# Patient Record
Sex: Female | Born: 1960 | Race: White | Hispanic: No | Marital: Married | Smoking: Never smoker
Health system: Southern US, Community
[De-identification: ages and names within clinical notes are randomized; demographics above are authoritative.]

## PROBLEM LIST (undated history)

## (undated) ENCOUNTER — Ambulatory Visit

## (undated) DIAGNOSIS — E785 Hyperlipidemia, unspecified: Secondary | ICD-10-CM

## (undated) DIAGNOSIS — H269 Unspecified cataract: Secondary | ICD-10-CM

## (undated) DIAGNOSIS — K219 Gastro-esophageal reflux disease without esophagitis: Secondary | ICD-10-CM

## (undated) DIAGNOSIS — N200 Calculus of kidney: Secondary | ICD-10-CM

## (undated) DIAGNOSIS — F419 Anxiety disorder, unspecified: Secondary | ICD-10-CM

## (undated) DIAGNOSIS — R011 Cardiac murmur, unspecified: Secondary | ICD-10-CM

## (undated) DIAGNOSIS — F32A Depression, unspecified: Secondary | ICD-10-CM

## (undated) DIAGNOSIS — Z780 Asymptomatic menopausal state: Secondary | ICD-10-CM

## (undated) DIAGNOSIS — F329 Major depressive disorder, single episode, unspecified: Secondary | ICD-10-CM

## (undated) DIAGNOSIS — T7840XA Allergy, unspecified, initial encounter: Secondary | ICD-10-CM

## (undated) DIAGNOSIS — M1990 Unspecified osteoarthritis, unspecified site: Secondary | ICD-10-CM

## (undated) DIAGNOSIS — R87610 Atypical squamous cells of undetermined significance on cytologic smear of cervix (ASC-US): Secondary | ICD-10-CM

## (undated) HISTORY — DX: Calculus of kidney: N20.0

## (undated) HISTORY — DX: Anxiety disorder, unspecified: F41.9

## (undated) HISTORY — DX: Cardiac murmur, unspecified: R01.1

## (undated) HISTORY — PX: TONSILLECTOMY: SUR1361

## (undated) HISTORY — DX: Unspecified osteoarthritis, unspecified site: M19.90

## (undated) HISTORY — DX: Atypical squamous cells of undetermined significance on cytologic smear of cervix (ASC-US): R87.610

## (undated) HISTORY — DX: Unspecified cataract: H26.9

## (undated) HISTORY — DX: Major depressive disorder, single episode, unspecified: F32.9

## (undated) HISTORY — DX: Hyperlipidemia, unspecified: E78.5

## (undated) HISTORY — PX: COLPOSCOPY: SHX161

## (undated) HISTORY — DX: Asymptomatic menopausal state: Z78.0

## (undated) HISTORY — DX: Depression, unspecified: F32.A

## (undated) HISTORY — DX: Gastro-esophageal reflux disease without esophagitis: K21.9

## (undated) HISTORY — DX: Allergy, unspecified, initial encounter: T78.40XA

## (undated) HISTORY — PX: DILATION AND CURETTAGE OF UTERUS: SHX78

---

## 1983-12-28 HISTORY — PX: TONSILLECTOMY: SHX5217

## 1998-05-05 ENCOUNTER — Other Ambulatory Visit: Admission: RE | Admit: 1998-05-05 | Discharge: 1998-05-05 | Payer: Self-pay | Admitting: Obstetrics and Gynecology

## 2000-12-06 ENCOUNTER — Encounter: Admission: RE | Admit: 2000-12-06 | Discharge: 2000-12-06 | Payer: Self-pay | Admitting: Family Medicine

## 2000-12-06 ENCOUNTER — Encounter: Payer: Self-pay | Admitting: Family Medicine

## 2001-09-17 ENCOUNTER — Emergency Department (HOSPITAL_COMMUNITY): Admission: EM | Admit: 2001-09-17 | Discharge: 2001-09-17 | Payer: Self-pay | Admitting: Emergency Medicine

## 2001-12-12 ENCOUNTER — Encounter: Payer: Self-pay | Admitting: Emergency Medicine

## 2001-12-12 ENCOUNTER — Emergency Department (HOSPITAL_COMMUNITY): Admission: EM | Admit: 2001-12-12 | Discharge: 2001-12-12 | Payer: Self-pay | Admitting: Emergency Medicine

## 2002-03-22 ENCOUNTER — Other Ambulatory Visit: Admission: RE | Admit: 2002-03-22 | Discharge: 2002-03-22 | Payer: Self-pay | Admitting: Gynecology

## 2002-09-25 ENCOUNTER — Other Ambulatory Visit: Admission: RE | Admit: 2002-09-25 | Discharge: 2002-09-25 | Payer: Self-pay | Admitting: Gynecology

## 2003-10-30 ENCOUNTER — Other Ambulatory Visit: Admission: RE | Admit: 2003-10-30 | Discharge: 2003-10-30 | Payer: Self-pay | Admitting: Gynecology

## 2004-05-05 ENCOUNTER — Other Ambulatory Visit: Admission: RE | Admit: 2004-05-05 | Discharge: 2004-05-05 | Payer: Self-pay | Admitting: Gynecology

## 2004-11-30 ENCOUNTER — Other Ambulatory Visit: Admission: RE | Admit: 2004-11-30 | Discharge: 2004-11-30 | Payer: Self-pay | Admitting: Gynecology

## 2005-07-08 ENCOUNTER — Other Ambulatory Visit: Admission: RE | Admit: 2005-07-08 | Discharge: 2005-07-08 | Payer: Self-pay | Admitting: Gynecology

## 2005-12-09 ENCOUNTER — Other Ambulatory Visit: Admission: RE | Admit: 2005-12-09 | Discharge: 2005-12-09 | Payer: Self-pay | Admitting: Gynecology

## 2006-07-26 ENCOUNTER — Other Ambulatory Visit: Admission: RE | Admit: 2006-07-26 | Discharge: 2006-07-26 | Payer: Self-pay | Admitting: Gynecology

## 2007-01-11 ENCOUNTER — Other Ambulatory Visit: Admission: RE | Admit: 2007-01-11 | Discharge: 2007-01-11 | Payer: Self-pay | Admitting: Gynecology

## 2007-07-24 ENCOUNTER — Other Ambulatory Visit: Admission: RE | Admit: 2007-07-24 | Discharge: 2007-07-24 | Payer: Self-pay | Admitting: Gynecology

## 2008-01-29 ENCOUNTER — Other Ambulatory Visit: Admission: RE | Admit: 2008-01-29 | Discharge: 2008-01-29 | Payer: Self-pay | Admitting: Gynecology

## 2008-09-30 ENCOUNTER — Other Ambulatory Visit: Admission: RE | Admit: 2008-09-30 | Discharge: 2008-09-30 | Payer: Self-pay | Admitting: Gynecology

## 2008-09-30 ENCOUNTER — Ambulatory Visit: Payer: Self-pay | Admitting: Gynecology

## 2008-09-30 ENCOUNTER — Encounter: Payer: Self-pay | Admitting: Gynecology

## 2008-10-14 ENCOUNTER — Ambulatory Visit: Payer: Self-pay | Admitting: Internal Medicine

## 2009-03-06 ENCOUNTER — Ambulatory Visit: Payer: Self-pay | Admitting: Internal Medicine

## 2009-03-10 ENCOUNTER — Ambulatory Visit: Payer: Self-pay | Admitting: Gynecology

## 2009-03-10 ENCOUNTER — Other Ambulatory Visit: Admission: RE | Admit: 2009-03-10 | Discharge: 2009-03-10 | Payer: Self-pay | Admitting: Gynecology

## 2009-03-10 ENCOUNTER — Encounter: Payer: Self-pay | Admitting: Gynecology

## 2009-03-19 ENCOUNTER — Ambulatory Visit: Payer: Self-pay | Admitting: Gynecology

## 2009-12-27 HISTORY — PX: COLONOSCOPY: SHX174

## 2010-03-17 ENCOUNTER — Ambulatory Visit: Payer: Self-pay | Admitting: Gynecology

## 2010-03-23 ENCOUNTER — Ambulatory Visit: Payer: Self-pay | Admitting: Gynecology

## 2010-03-23 ENCOUNTER — Other Ambulatory Visit: Admission: RE | Admit: 2010-03-23 | Discharge: 2010-03-23 | Payer: Self-pay | Admitting: Gynecology

## 2010-06-15 ENCOUNTER — Encounter (INDEPENDENT_AMBULATORY_CARE_PROVIDER_SITE_OTHER): Payer: Self-pay | Admitting: *Deleted

## 2010-06-15 ENCOUNTER — Ambulatory Visit: Payer: Self-pay | Admitting: Internal Medicine

## 2010-08-07 ENCOUNTER — Ambulatory Visit: Payer: Self-pay | Admitting: Internal Medicine

## 2010-08-19 ENCOUNTER — Encounter (INDEPENDENT_AMBULATORY_CARE_PROVIDER_SITE_OTHER): Payer: Self-pay | Admitting: *Deleted

## 2010-08-24 ENCOUNTER — Ambulatory Visit: Payer: Self-pay | Admitting: Internal Medicine

## 2010-09-17 ENCOUNTER — Ambulatory Visit: Payer: Self-pay | Admitting: Internal Medicine

## 2010-09-19 ENCOUNTER — Encounter: Payer: Self-pay | Admitting: Internal Medicine

## 2011-01-26 NOTE — Procedures (Signed)
Summary: Colonoscopy  Patient: [name] Note: All result statuses are Final unless otherwise noted.  Tests: (1) Colonoscopy (COL)   COL Colonoscopy           DONE     [facility]     [address]     [place], [place]  [address]           COLONOSCOPY PROCEDURE REPORT           PATIENT:  Paula Bradley, Paula Bradley  MR#:  [id number]     BIRTHDATE:  May 03, 1961, 48 yrs. old  GENDER:  female     ENDOSCOPIST:  Hedwig Morton. Juanda Chance, MD     REF. BY:  Sharlet Salina, M.D.     PROCEDURE DATE:  09/17/2010     PROCEDURE:  Colonoscopy 56213     ASA CLASS:  Class I     INDICATIONS:  Routine Risk Screening     MEDICATIONS:   Versed 8 mg, Fentanyl 50 mcg, Benadryl 25 mg           DESCRIPTION OF PROCEDURE:   After the risks benefits and     alternatives of the procedure were thoroughly explained, informed     consent was obtained.  Digital rectal exam was performed and     revealed no rectal masses.   The LB CF-[id number] endoscope     was introduced through the anus and advanced to the cecum, which     was identified by both the appendix and ileocecal valve, without     limitations.  The quality of the prep was good, using MiraLax.     The instrument was then slowly withdrawn as the colon was fully     examined.     <<PROCEDUREIMAGES>>           FINDINGS:  A diminutive polyp was found. at 40 cm sessile     elongated polyp The polyp was removed using cold biopsy forceps     (see image4).  This was otherwise a normal examination of the     colon (see image5, image3, image2, and image1).   Retroflexed     views in the rectum revealed no abnormalities.    The scope was     then withdrawn from the patient and the procedure completed.           COMPLICATIONS:  None     ENDOSCOPIC IMPRESSION:     1) Diminutive polyp     2) Otherwise normal examination     3) Normal colonoscopy     RECOMMENDATIONS:     1) Await pathology results     REPEAT EXAM:  In 10 year(s) for.        ______________________________     Hedwig Morton. Juanda Chance, MD           CC:           n.     eSIGNED:   Hedwig Morton. Brodie at 09/17/2010 10:31 AM           Loura Back, [id number]  Note: An exclamation mark (!) indicates a result that was not dispersed into the flowsheet. Document Creation Date: 09/17/2010 10:31 AM _______________________________________________________________________  (1) Order result status: Final Collection or observation date-time: 09/17/2010 10:25 Requested date-time:  Receipt date-time:  Reported date-time:  Referring Physician:   Ordering Physician: Lina Sar [phone]) Specimen Source:  Source: Launa Grill Order Number: [id number] Lab site:   Appended Document: Colonoscopy recall  Procedures Next Due Date:    Colonoscopy: 08/2020

## 2011-01-26 NOTE — Letter (Signed)
Summary: [facility] Instructions  [facility]  [address] [place], [place] [address]   Phone: [phone]  Fax: [phone]       FRUMA AFRICA    10-Aug-1961    MRN: [id number]       Procedure Day Paula Bradley:  Duanne Limerick  09/07/10     Arrival Time: 8:30AM     Procedure Time:  9:30AM     Location of Procedure:                    Juliann Pares  [facility] (4th Floor)  PREPARATION FOR COLONOSCOPY WITH MIRALAX  Starting 5 days prior to your procedure 09/02/10 do not eat nuts, seeds, popcorn, corn, beans, peas,  salads, or any raw vegetables.  Do not take any fiber supplements (e.g. Metamucil, Citrucel, and Benefiber). ____________________________________________________________________________________________________   THE DAY BEFORE YOUR PROCEDURE         DATE: 09/06/10  DAY: SUNDAY  1   Drink clear liquids the entire day-NO SOLID FOOD  2   Do not drink anything colored red or purple.  Avoid juices with pulp.  No orange juice.  3   Drink at least 64 oz. (8 glasses) of fluid/clear liquids during the day to prevent dehydration and help the prep work efficiently.  CLEAR LIQUIDS INCLUDE: Water Jello Ice Popsicles Tea (sugar ok, no milk/cream) Powdered fruit flavored drinks Coffee (sugar ok, no milk/cream) Gatorade Juice: apple, white grape, white cranberry  Lemonade Clear bullion, consomm, broth Carbonated beverages (any kind) Strained chicken noodle soup Hard Candy  4   Mix the entire bottle of Miralax with 64 oz. of Gatorade/Powerade in the morning and put in the refrigerator to chill.  5   At 3:00 pm take 2 Dulcolax/Bisacodyl tablets.  6   At 4:30 pm take one Reglan/Metoclopramide tablet.  7  Starting at 5:00 pm drink one 8 oz glass of the Miralax mixture every 15-20 minutes until you have finished drinking the entire 64 oz.  You should finish drinking prep around 7:30 or 8:00 pm.  8   If you are nauseated, you may take the 2nd Reglan/Metoclopramide tablet at  6:30 pm.        9    At 8:00 pm take 2 more DULCOLAX/Bisacodyl tablets.     THE DAY OF YOUR PROCEDURE      DATE:  09/07/10  DAY: Duanne Limerick  You may drink clear liquids until 7:30AM  (2 HOURS BEFORE PROCEDURE).   MEDICATION INSTRUCTIONS  Unless otherwise instructed, you should take regular prescription medications with a small sip of water as early as possible the morning of your procedure.          OTHER INSTRUCTIONS  You will need a responsible adult at least 52 years of age to accompany you and drive you home.   This person must remain in the waiting room during your procedure.  Wear loose fitting clothing that is easily removed.  Leave jewelry and other valuables at home.  However, you may wish to bring a book to read or an iPod/MP3 player to listen to music as you wait for your procedure to start.  Remove all body piercing jewelry and leave at home.  Total time from sign-in until discharge is approximately 2-3 hours.  You should go home directly after your procedure and rest.  You can resume normal activities the day after your procedure.  The day of your procedure you should not:   Drive  Make legal decisions   Operate machinery   Drink alcohol   Return to work  You will receive specific instructions about eating, activities and medications before you leave.   The above instructions have been reviewed and explained to me by  Wyona Almas RN  August 24, 2010 8:47 AM     I fully understand and can verbalize these instructions _____________________________ Date _______

## 2011-01-26 NOTE — Miscellaneous (Signed)
Summary: [facility] Previsit/prep  Clinical Lists Changes  Medications: Added new medication of DULCOLAX 5 MG  TBEC (BISACODYL) Day before procedure take 2 at 3pm and 2 at 8pm. - Signed Added new medication of METOCLOPRAMIDE HCL 10 MG  TABS (METOCLOPRAMIDE HCL) As per prep instructions. - Signed Added new medication of MIRALAX   POWD (POLYETHYLENE GLYCOL 3350) As per prep  instructions. - Signed Rx of DULCOLAX 5 MG  TBEC (BISACODYL) Day before procedure take 2 at 3pm and 2 at 8pm.;  #4 x 0;  Signed;  Entered by: Wyona Almas RN;  Authorized by: Hart Carwin MD;  Method used: Electronically to [facility] [id number]*, [address], [place], [place], [place]  [address], Ph: [phone], Fax: [phone] Rx of METOCLOPRAMIDE HCL 10 MG  TABS (METOCLOPRAMIDE HCL) As per prep instructions.;  #2 x 0;  Signed;  Entered by: Wyona Almas RN;  Authorized by: Hart Carwin MD;  Method used: Electronically to [facility] [id number]*, [address] [place], [place], [place]  [address], Ph: [phone], Fax: [phone] Rx of MIRALAX   POWD (POLYETHYLENE GLYCOL 3350) As per prep  instructions.;  #255gm x 0;  Signed;  Entered by: Wyona Almas RN;  Authorized by: Hart Carwin MD;  Method used: Electronically to [facility] [address][id number]*, [address] [place], [place], [place]  [address], Ph: [phone], Fax: [phone] Allergies: Added new allergy or adverse reaction of * ADVAIR Observations: Added new observation of NKA: F (08/24/2010 8:04)    Prescriptions: MIRALAX   POWD (POLYETHYLENE GLYCOL 3350) As per prep  instructions.  #255gm x 0   Entered by:   Wyona Almas RN   Authorized by:   Hart Carwin MD   Signed by:   Wyona Almas RN on 08/24/2010   Method used:   Electronically to        [facility] [id number]* (retail)       [address] [place]       [place], [place]  [address]     Ph: [phone]       Fax: [phone]   RxID:   [id number] METOCLOPRAMIDE HCL 10 MG  TABS (METOCLOPRAMIDE HCL) As per prep instructions.  #2 x 0   Entered by:   Wyona Almas RN   Authorized by:   Hart Carwin MD   Signed by:   Wyona Almas RN on 08/24/2010   Method used:   Electronically to        [facility] [id number]* (retail)       [address] [place]       [place], [place]  [address]       Ph: [phone]       Fax: [phone]   RxID:   [id number] DULCOLAX 5 MG  TBEC (BISACODYL) Day before procedure take 2 at 3pm and 2 at 8pm.  #4 x 0   Entered by:   Wyona Almas RN   Authorized by:   Hart Carwin MD   Signed by:   Wyona Almas RN on 08/24/2010   Method used:   Electronically to        [facility] [address] [facility] [id number]* (retail)       [address]       [place], [place]  [address]       Ph: [phone]       Fax: [phone]   RxID:   [id number]

## 2011-01-26 NOTE — Letter (Signed)
Summary: Patient Notice- Polyp Results  [facility]  [address] [place], [place] [address]   Phone: [phone]  Fax: [phone]        September 19, 2010 MRN: [id number]    Paula Bradley [address] [place], [place]  [address]    Dear [name],  I am pleased to inform you that the colon polyp(s) removed during your recent colonoscopy was (were) found to be benign (no cancer detected) upon pathologic examination.The polyp consisted of lymphoid tissue, no true polyp.  I recommend you have a repeat colonoscopy examination in 10_ years to look for recurrent polyps, as having colon polyps increases your risk for having recurrent polyps or even colon cancer in the future.  Should you develop new or worsening symptoms of abdominal pain, bowel habit changes or bleeding from the rectum or bowels, please schedule an evaluation with either your primary care physician or with me.  Additional information/recommendations:  __ No further action with gastroenterology is needed at this time. Please      follow-up with your primary care physician for your other healthcare      needs.  __ Please call [phone] to schedule a return visit to review your      situation.  __ Please keep your follow-up visit as already scheduled.  __ Continue treatment plan as outlined the day of your exam.  Please call us if you are having persistent problems or have questions about your condition that have not been fully answered at this time.  Sincerely,  Hart Carwin MD  This letter has been electronically signed by your physician.  Appended Document: Patient Notice- Polyp Results letter mailed

## 2011-01-26 NOTE — Letter (Signed)
Summary: Previsit letter  [facility]  [address] [place], [place] [address]   Phone: [phone]  Fax: [phone]       06/15/2010 MRN: [id number]  Paula Bradley [address] [place], [place]  [address]  Dear [name],  Welcome to the Gastroenterology Division at [facility].    You are scheduled to see a nurse for your pre-procedure visit on 08-24-10 at 8:30am On the 3rd floor at [facility], [address]  We ask that you try to arrive at our office 15 minutes prior to your appointment time to allow for check-in.  Your nurse visit will consist of discussing your medical and surgical history, your immediate family medical history, and your medications.    Please bring a complete list of all your medications or, if you prefer, bring the medication bottles and we will list them.  We will need to be aware of both prescribed and over the counter drugs.  We will need to know exact dosage information as well.  If you are on blood thinners (Coumadin, Plavix, Aggrenox, Ticlid, etc.) please call our office today/prior to your appointment, as we need to consult with your physician about holding your medication.   Please be prepared to read and sign documents such as consent forms, a financial agreement, and acknowledgement forms.  If necessary, and with your consent, a friend or relative is welcome to sit-in on the nurse visit with you.  Please bring your insurance card so that we may make a copy of it.  If your insurance requires a referral to see a specialist, please bring your referral form from your primary care physician.  No co-pay is required for this nurse visit.     If you cannot keep your appointment, please call [phone] to cancel or reschedule prior to your appointment date.  This allows [place] the opportunity to schedule an appointment for another patient in need of care.    Thank you for choosing [facility] for your medical  needs.  We appreciate the opportunity to care for you.  Please visit [place] at our website  to learn more about our practice.                     Sincerely.                                                                                                                   The Gastroenterology Division

## 2011-02-18 DIAGNOSIS — H6120 Impacted cerumen, unspecified ear: Secondary | ICD-10-CM

## 2011-03-04 ENCOUNTER — Ambulatory Visit (INDEPENDENT_AMBULATORY_CARE_PROVIDER_SITE_OTHER): Payer: BC Managed Care – PPO | Admitting: Internal Medicine

## 2011-03-04 DIAGNOSIS — E785 Hyperlipidemia, unspecified: Secondary | ICD-10-CM

## 2011-03-04 DIAGNOSIS — H6120 Impacted cerumen, unspecified ear: Secondary | ICD-10-CM

## 2011-03-15 ENCOUNTER — Encounter: Payer: Self-pay | Admitting: *Deleted

## 2011-03-25 ENCOUNTER — Encounter: Payer: BC Managed Care – PPO | Admitting: Gynecology

## 2011-04-30 ENCOUNTER — Encounter (INDEPENDENT_AMBULATORY_CARE_PROVIDER_SITE_OTHER): Payer: BC Managed Care – PPO | Admitting: Gynecology

## 2011-04-30 ENCOUNTER — Other Ambulatory Visit (HOSPITAL_COMMUNITY)
Admission: RE | Admit: 2011-04-30 | Discharge: 2011-04-30 | Disposition: A | Discharge status: Home / Self Care | Payer: BC Managed Care – PPO | Source: Ambulatory Visit | Attending: Gynecology | Admitting: Gynecology

## 2011-04-30 ENCOUNTER — Other Ambulatory Visit: Payer: Self-pay | Admitting: Gynecology

## 2011-04-30 DIAGNOSIS — Z13220 Encounter for screening for lipoid disorders: Secondary | ICD-10-CM

## 2011-04-30 DIAGNOSIS — Z124 Encounter for screening for malignant neoplasm of cervix: Secondary | ICD-10-CM | POA: Insufficient documentation

## 2011-04-30 DIAGNOSIS — N952 Postmenopausal atrophic vaginitis: Secondary | ICD-10-CM

## 2011-04-30 DIAGNOSIS — R82998 Other abnormal findings in urine: Secondary | ICD-10-CM

## 2011-04-30 DIAGNOSIS — Z833 Family history of diabetes mellitus: Secondary | ICD-10-CM

## 2011-04-30 DIAGNOSIS — Z79890 Hormone replacement therapy (postmenopausal): Secondary | ICD-10-CM

## 2011-04-30 DIAGNOSIS — Z01419 Encounter for gynecological examination (general) (routine) without abnormal findings: Secondary | ICD-10-CM

## 2011-05-06 ENCOUNTER — Encounter (INDEPENDENT_AMBULATORY_CARE_PROVIDER_SITE_OTHER): Payer: BC Managed Care – PPO

## 2011-05-06 DIAGNOSIS — Z13820 Encounter for screening for osteoporosis: Secondary | ICD-10-CM

## 2011-09-07 ENCOUNTER — Other Ambulatory Visit: Payer: BC Managed Care – PPO | Admitting: Internal Medicine

## 2011-09-09 ENCOUNTER — Ambulatory Visit: Payer: BC Managed Care – PPO | Admitting: Internal Medicine

## 2011-11-29 ENCOUNTER — Encounter: Payer: Self-pay | Admitting: Internal Medicine

## 2011-11-29 ENCOUNTER — Ambulatory Visit (INDEPENDENT_AMBULATORY_CARE_PROVIDER_SITE_OTHER): Payer: BC Managed Care – PPO | Admitting: Internal Medicine

## 2011-11-29 DIAGNOSIS — J111 Influenza due to unidentified influenza virus with other respiratory manifestations: Secondary | ICD-10-CM

## 2011-11-29 DIAGNOSIS — J45909 Unspecified asthma, uncomplicated: Secondary | ICD-10-CM

## 2011-11-29 DIAGNOSIS — F411 Generalized anxiety disorder: Secondary | ICD-10-CM

## 2011-11-29 DIAGNOSIS — F419 Anxiety disorder, unspecified: Secondary | ICD-10-CM

## 2011-11-29 NOTE — Progress Notes (Signed)
  Subjective:    Patient ID: Paula Bradley, female    DOB: 1961-08-06, 50 y.o.   MRN: [id number]  HPI Patient's mother passed away in early Nov 13, 2023 of complications of dementia and septicemia from urinary tract infection. Patient has been grieving the loss of her mother. In the meantime she and her husband have been going to marriage counseling. Patient says that she has invested a great deal of time in raising her children. Husband tends to go to the country club at night to socialize and drinking comes on at 9 or 10:00 in the evening. They have grown apart. Patient has considered leaving the marriage.  Patient has developed fever and has felt short of breath. History of asthma. Albuterol inhaler as expired. Patient has myalgias, dry hacking cough, malaise, fatigue, decreased appetite. Has been ill since 11/27/2011. No nausea or vomiting. No diarrhea.    Review of Systems     Objective:   Physical Exam HEENT exam: TMs are clear, pharynx is clear. Neck is supple without significant adenopathy. Chest is clear without rales or wheezing.        Assessment & Plan:  Influenza  Grief reaction from loss of mother  Marital strife  Plan: Tamiflu 75 mg twice daily for 5 days. Zithromax Z-PAK 2 tablets by mouth day one followed by 1 tablet by mouth days 2 through 5. Hycodan syrup 1 teaspoon by mouth every 6 hours when necessary for cough (8 ounces) refill albuterol inhaler 2 sprays by mouth 4 times a day when necessary one year

## 2011-11-29 NOTE — Patient Instructions (Signed)
Use albuterol inhaler as needed for chest tightness and cough or wheezing. Take Tamiflu twice daily for 5 days. Start Zithromax Z-Pak today 2 tablets by mouth day one followed 01 tablet by mouth days 2 through 5. Use Hycodan for cough. Drink plenty of fluids. Try to rest. Call if not better in 48-72 hours or sooner if worse

## 2012-01-03 ENCOUNTER — Other Ambulatory Visit: Payer: Self-pay | Admitting: Internal Medicine

## 2012-01-03 ENCOUNTER — Telehealth: Payer: Self-pay

## 2012-01-03 NOTE — Telephone Encounter (Signed)
Seen here for URI approx  1 month ago. Is still have severe coughing spells and went to an Urgent Care over the weekend. Was given Prednisone dosepak and told to continue [employer]. Wonders  If she needs another antibiotic. Appointment made here for tomorrow.

## 2012-01-03 NOTE — Telephone Encounter (Signed)
See tomorrow

## 2012-01-04 ENCOUNTER — Ambulatory Visit (INDEPENDENT_AMBULATORY_CARE_PROVIDER_SITE_OTHER): Payer: PRIVATE HEALTH INSURANCE | Admitting: Internal Medicine

## 2012-01-04 ENCOUNTER — Encounter: Payer: Self-pay | Admitting: Internal Medicine

## 2012-01-04 VITALS — BP 116/76 | HR 76 | Temp 98.5°F | Wt 192.0 lb

## 2012-01-04 DIAGNOSIS — J40 Bronchitis, not specified as acute or chronic: Secondary | ICD-10-CM

## 2012-01-04 DIAGNOSIS — J45909 Unspecified asthma, uncomplicated: Secondary | ICD-10-CM

## 2012-01-04 DIAGNOSIS — F4321 Adjustment disorder with depressed mood: Secondary | ICD-10-CM

## 2012-01-04 DIAGNOSIS — E669 Obesity, unspecified: Secondary | ICD-10-CM

## 2012-01-04 DIAGNOSIS — F411 Generalized anxiety disorder: Secondary | ICD-10-CM

## 2012-01-04 DIAGNOSIS — R059 Cough, unspecified: Secondary | ICD-10-CM

## 2012-01-04 DIAGNOSIS — R05 Cough: Secondary | ICD-10-CM

## 2012-01-04 DIAGNOSIS — F419 Anxiety disorder, unspecified: Secondary | ICD-10-CM
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
## 2012-01-04 NOTE — Patient Instructions (Signed)
Take prednisone as prescribed by Dr. Ivory Broad. Start Avelox 400 mg daily for 10 days. Try Flovent DS 250 MCG one spray every 12 hours. Continue albuterol inhaler 2 sprays 4 times daily. Take Hycodan cough syrup 1 teaspoon every 6 hours as needed for cough. Call if not better in one week. We have ordered chest x-ray today.

## 2012-01-21 ENCOUNTER — Encounter: Payer: Self-pay | Admitting: Gynecology

## 2012-01-24 ENCOUNTER — Other Ambulatory Visit: Payer: Self-pay | Admitting: *Deleted

## 2012-01-24 DIAGNOSIS — R928 Other abnormal and inconclusive findings on diagnostic imaging of breast: Secondary | ICD-10-CM

## 2012-01-25 ENCOUNTER — Other Ambulatory Visit: Payer: Self-pay | Admitting: Gynecology

## 2012-01-25 ENCOUNTER — Other Ambulatory Visit: Payer: Self-pay | Admitting: *Deleted

## 2012-01-25 DIAGNOSIS — R928 Other abnormal and inconclusive findings on diagnostic imaging of breast: Secondary | ICD-10-CM

## 2012-01-27 ENCOUNTER — Encounter: Payer: Self-pay | Admitting: Gynecology

## 2012-01-31 ENCOUNTER — Encounter: Payer: Self-pay | Admitting: Gynecology

## 2012-02-14 ENCOUNTER — Encounter: Payer: Self-pay | Admitting: Internal Medicine

## 2012-02-14 ENCOUNTER — Ambulatory Visit (INDEPENDENT_AMBULATORY_CARE_PROVIDER_SITE_OTHER): Payer: PRIVATE HEALTH INSURANCE | Admitting: Internal Medicine

## 2012-02-14 VITALS — BP 116/76 | HR 72 | Temp 98.3°F | Wt 181.0 lb

## 2012-02-14 DIAGNOSIS — Z8709 Personal history of other diseases of the respiratory system: Secondary | ICD-10-CM

## 2012-02-14 DIAGNOSIS — E669 Obesity, unspecified: Secondary | ICD-10-CM

## 2012-02-14 DIAGNOSIS — F4321 Adjustment disorder with depressed mood: Secondary | ICD-10-CM

## 2012-02-14 NOTE — Patient Instructions (Signed)
Continue phentermine 37.5 mg every morning. Avoid caffeine from late afternoon until bedtime.

## 2012-02-14 NOTE — Progress Notes (Signed)
  Subjective:    Patient ID: Paula Bradley, female    DOB: June 09, 1961, 51 y.o.   MRN: [id number]  HPI patient has been on phentermine 37.5 mg daily since January 8. She's lost 11 pounds. She is very pleased with her weight loss. Blood pressure is good. Unfortunately was drinking unsweetened tea at dinner and this is exacerbating insomnia. Needs to avoid caffeine while taking this medication from the late afternoon on until bedtime. Seems to be less stressed with marriage and her daughter. Less depressed. Asthma issues have resolved.    Review of Systems     Objective:   Physical Exam chest clear to auscultation; cardiac exam regular rate and rhythm; extremities without edema        Assessment & Plan:  Successful weight loss with phentermine  Plan: New prescription for phentermine 37.5 mg #30 one by mouth daily. Explained to patient would only be willing to do this for 12 weeks. She will return in 4 weeks for weight check and office. She had to get down to 150 pounds.

## 2012-03-14 ENCOUNTER — Ambulatory Visit: Payer: PRIVATE HEALTH INSURANCE | Admitting: Internal Medicine

## 2012-03-21 ENCOUNTER — Other Ambulatory Visit: Payer: Self-pay | Admitting: Internal Medicine

## 2012-03-21 ENCOUNTER — Ambulatory Visit (INDEPENDENT_AMBULATORY_CARE_PROVIDER_SITE_OTHER): Payer: PRIVATE HEALTH INSURANCE | Admitting: Internal Medicine

## 2012-03-21 ENCOUNTER — Encounter: Payer: Self-pay | Admitting: Internal Medicine

## 2012-03-21 VITALS — BP 122/64 | HR 84 | Temp 102.1°F | Wt 182.0 lb

## 2012-03-21 DIAGNOSIS — B349 Viral infection, unspecified: Secondary | ICD-10-CM

## 2012-03-21 DIAGNOSIS — B9789 Other viral agents as the cause of diseases classified elsewhere: Secondary | ICD-10-CM

## 2012-03-21 DIAGNOSIS — R509 Fever, unspecified: Secondary | ICD-10-CM

## 2012-03-21 DIAGNOSIS — J029 Acute pharyngitis, unspecified: Secondary | ICD-10-CM

## 2012-03-21 LAB — CBC WITH DIFFERENTIAL/PLATELET
Basophils Absolute: 0 10*3/uL (ref 0.0–0.1)
Basophils Relative: 0 % (ref 0–1)
Eosinophils Absolute: 0 10*3/uL (ref 0.0–0.7)
Eosinophils Relative: 0 % (ref 0–5)
HCT: 39.5 % (ref 36.0–46.0)
Hemoglobin: 13.4 g/dL (ref 12.0–15.0)
Lymphocytes Relative: 9 % — ABNORMAL LOW (ref 12–46)
Lymphs Abs: 1 10*3/uL (ref 0.7–4.0)
MCH: 30.7 pg (ref 26.0–34.0)
MCHC: 33.9 g/dL (ref 30.0–36.0)
MCV: 90.6 fL (ref 78.0–100.0)
Monocytes Absolute: 1 10*3/uL (ref 0.1–1.0)
Monocytes Relative: 9 % (ref 3–12)
Neutro Abs: 8.6 10*3/uL — ABNORMAL HIGH (ref 1.7–7.7)
Neutrophils Relative %: 81 % — ABNORMAL HIGH (ref 43–77)
Platelets: 248 10*3/uL (ref 150–400)
RBC: 4.36 MIL/uL (ref 3.87–5.11)
RDW: 12 % (ref 11.5–15.5)
WBC: 10.6 10*3/uL — ABNORMAL HIGH (ref 4.0–10.5)

## 2012-03-21 LAB — INFLUENZA A AND B
Inflenza A Ag: NEGATIVE
Influenza B Ag: NEGATIVE

## 2012-03-21 LAB — POCT RAPID STREP A (OFFICE): Rapid Strep A Screen: NEGATIVE

## 2012-03-21 NOTE — Progress Notes (Signed)
  Subjective:    Patient ID: Paula Bradley, female    DOB: 1961-08-02, 51 y.o.   MRN: [id number]  HPI 51 year old white female is been working very hard recently as she opened the business and Programme researcher, broadcasting/film/video. Says she has gotten run down. Today complaining of fever and sore throat with myalgias and headache. Has malaise and fatigue. Rapid strep screen is negative. No cough. History of asthma.    Review of Systems     Objective:   Physical Exam pharynx is red without exudate; TMs slightly full; neck is supple without significant adenopathy; chest clear. CBC with differential is pending as is nasal swab for influenza.        Assessment & Plan:  Possible influenza  Pharyngitis-nonstrep  Viral syndrome  Plan: Tamiflu 75 mg twice daily for 5 days. Levaquin 500 milligrams daily for 7 days. CBC with differential is pending. Nasal swab for influenza is pending.

## 2012-03-21 NOTE — Patient Instructions (Signed)
Take Tamiflu twice daily for 5 days. Take Levaquin once daily with a meal for 7 days. Take Tylenol for fever. Call if not better in 48 hours.

## 2012-03-27 ENCOUNTER — Encounter: Payer: Self-pay | Admitting: Gynecology

## 2012-04-04 ENCOUNTER — Encounter: Payer: Self-pay | Admitting: Internal Medicine

## 2012-04-04 ENCOUNTER — Ambulatory Visit (INDEPENDENT_AMBULATORY_CARE_PROVIDER_SITE_OTHER): Payer: PRIVATE HEALTH INSURANCE | Admitting: Internal Medicine

## 2012-04-04 VITALS — BP 120/76 | HR 80 | Temp 98.1°F | Wt 180.0 lb

## 2012-04-04 DIAGNOSIS — F411 Generalized anxiety disorder: Secondary | ICD-10-CM

## 2012-04-04 DIAGNOSIS — E669 Obesity, unspecified: Secondary | ICD-10-CM

## 2012-04-04 DIAGNOSIS — F419 Anxiety disorder, unspecified: Secondary | ICD-10-CM

## 2012-04-23 NOTE — Patient Instructions (Signed)
Try to find time to diet and exercise and watch calorie consumption. Phentermine may cause irritability.

## 2012-04-23 NOTE — Progress Notes (Signed)
  Subjective:    Patient ID: Paula Bradley, female    DOB: October 17, 1961, 51 y.o.   MRN: [id number]  HPI 51 year old white female with obesity and asthma for followup on weight loss and weight loss management. Has been taking phentermine for weight loss 37.5 mg daily. Patient reports phentermine may be making her irritable. 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      Review of Systems     Objective:   Physical Exam Weight is 180 pounds today. Blood pressure stable at 120/76. Chest clear. Cardiac exam regular rate and rhythm. Previous weight February 2013 181 pounds. Prior to that she had lost 11 pounds.       Assessment & Plan:  Obesity  Plan: With all of the stress of opening in the business, she may not be able to tolerate phentermine on a daily basis. She needs to try to find time to diet and exercise. She's been putting off her effort in the opening in the business. His not going all that well with her new business partner. She probably needs to have a frank conversation with her.

## 2012-04-28 ENCOUNTER — Encounter: Payer: Self-pay | Admitting: Gynecology

## 2012-05-02 ENCOUNTER — Encounter: Payer: Self-pay | Admitting: Gynecology

## 2012-05-02 ENCOUNTER — Ambulatory Visit (INDEPENDENT_AMBULATORY_CARE_PROVIDER_SITE_OTHER): Payer: PRIVATE HEALTH INSURANCE | Admitting: Gynecology

## 2012-05-02 VITALS — BP 120/74 | Ht 65.0 in | Wt 180.0 lb

## 2012-05-02 DIAGNOSIS — Z01419 Encounter for gynecological examination (general) (routine) without abnormal findings: Secondary | ICD-10-CM

## 2012-05-02 DIAGNOSIS — Z79890 Hormone replacement therapy (postmenopausal): Secondary | ICD-10-CM

## 2012-05-02 MED ORDER — ESTRADIOL 0.5 MG PO TABS: 0.5000 mg | ORAL_TABLET | Freq: Every day | ORAL | Status: DC

## 2012-05-02 MED ORDER — PROGESTERONE MICRONIZED 100 MG PO CAPS: 100.0000 mg | ORAL_CAPSULE | Freq: Every day | ORAL | Status: DC

## 2012-05-02 NOTE — Progress Notes (Signed)
Paula Bradley 03/19/1961 [id number]        51 y.o.  for annual exam.  Several issues noted below.  Past medical history,surgical history, medications, allergies, family history and social history were all reviewed and documented in the EPIC chart. ROS:  Was performed and pertinent positives and negatives are included in the history.  Exam: Kim chaperone present Filed Vitals:   05/02/12 1029  BP: 120/74   General appearance  Normal Skin grossly normal Head/Neck normal with no cervical or supraclavicular adenopathy thyroid normal Lungs  clear Cardiac RR, without RMG Abdominal  soft, nontender, without masses, organomegaly or hernia Breasts  examined lying and sitting without masses, retractions, discharge or axillary adenopathy. Pelvic  Ext/BUS/vagina  normal   Cervix  normal   Uterus  anteverted, normal size, shape and contour, midline and mobile nontender   Adnexa  Without masses or tenderness    Anus and perineum  normal   Rectovaginal  normal sphincter tone without palpated masses or tenderness.    Assessment/Plan:  51 y.o. female for annual exam.    1. HRT. Patient is on Estrace 0.5 mg and Prometrium 100 mg nightly. She's doing well without symptoms. I reviewed the issue of HRT, WHI study with increased risk of stroke heart attack DVT and breast cancer. The ACOG and NAMS statements for lowest dose for shortest period of time reviewed. The options for weaning or continuing were reviewed. Patient wants to continue at present and I refilled her medications times a year. 2. Breast health. Patient recently underwent a workup for questionable area in the left breast behind the nipple. She had mammogram/ultrasound/BSG I. When she went back to have the area biopsy they could not find it and she is slotted for a three-month follow up with Yolanda Bonine. Her exam today is normal and should continue to follow up with them and their recommendations. 3. Pap smear. The Pap smear was done today. Her  last Pap smear was 2012. She did have an ASCUS negative high risk HPV Pap smear in 2008 with 3 subsequent Pap smears normal. I discussed less frequent screening intervals will plan every 3 year Pap smears. 4. DEXA. Patient had DEXA May 2012 which was normal. We'll plan repeat in 5 years. Increase calcium vitamin D reviewed. 5. Colonoscopy. Patient had a colonoscopy 2 years ago follow up further med schedule. 6. Health maintenance. No blood work was done today as it's all done through Dr. Beryle Quant office. She is actively being followed by her for weight loss and is following up with glucose and cholesterol. She actually is due to see her this month and will make an appointment to do so.    [name] MD, 11:05 AM 05/02/2012

## 2012-05-02 NOTE — Patient Instructions (Signed)
Follow up in one year for annual gynecologic exam. 

## 2012-05-04 ENCOUNTER — Other Ambulatory Visit: Payer: Self-pay | Admitting: *Deleted

## 2012-05-04 DIAGNOSIS — Z09 Encounter for follow-up examination after completed treatment for conditions other than malignant neoplasm: Secondary | ICD-10-CM

## 2012-05-05 ENCOUNTER — Encounter: Payer: Self-pay | Admitting: Gynecology

## 2012-05-08 ENCOUNTER — Ambulatory Visit: Payer: PRIVATE HEALTH INSURANCE | Admitting: Internal Medicine

## 2012-05-15 ENCOUNTER — Encounter: Payer: Self-pay | Admitting: Internal Medicine

## 2012-05-15 ENCOUNTER — Ambulatory Visit (INDEPENDENT_AMBULATORY_CARE_PROVIDER_SITE_OTHER): Payer: PRIVATE HEALTH INSURANCE | Admitting: Internal Medicine

## 2012-05-15 VITALS — BP 96/70 | HR 80 | Temp 98.2°F | Wt 175.0 lb

## 2012-05-15 DIAGNOSIS — Z23 Encounter for immunization: Secondary | ICD-10-CM

## 2012-05-15 DIAGNOSIS — E669 Obesity, unspecified: Secondary | ICD-10-CM

## 2012-05-15 NOTE — Progress Notes (Signed)
  Subjective:    Patient ID: Paula Bradley, female    DOB: [birth year], 51 y.o.   MRN: [id number]  HPI Patient here for followup on obesity and weight loss. Has lost 5 pounds since last visit. Total weight loss of 17 pounds since January 2013. She's feeling better about herself with this weight loss. However she is drinking a half bottle of wine at night. These are empty calories she does not need. Talked with her about that and increasing exercise. 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 Currently on phentermine 37.5 mg daily. Sometimes this is keeping her up late at night particularly if she has caffeine late afternoon or early evening. Reminded patient not to do that.    Review of Systems     Objective:   Physical Exam Chest clear to auscultation cardiac exam regular rate and rhythm. Extremities without edema       Assessment & Plan:  Weight loss of 17 pounds since January 2013. I had hoped   patient would have lost more weight by this point in time. Needs to cut back on wine consumption. Needs to exercise. Have given her an additional 30 tablets of phentermine 37.5 mg daily. See her again in 4 weeks. Told her she should try to lose 3 pounds weekly.  Tdap vaccine given today.

## 2012-05-15 NOTE — Patient Instructions (Signed)
Continue phentermine 37.5 mg daily. Recheck in 4 weeks. Cut back on wine consumption. Increase exercise. Try to lose 2-3 pounds weekly.

## 2012-05-18 ENCOUNTER — Other Ambulatory Visit: Payer: Self-pay | Admitting: Gynecology

## 2012-05-18 DIAGNOSIS — Z09 Encounter for follow-up examination after completed treatment for conditions other than malignant neoplasm: Secondary | ICD-10-CM

## 2012-06-22 ENCOUNTER — Ambulatory Visit: Payer: PRIVATE HEALTH INSURANCE | Admitting: Internal Medicine

## 2013-01-23 ENCOUNTER — Other Ambulatory Visit: Payer: Self-pay | Admitting: *Deleted

## 2013-01-23 DIAGNOSIS — IMO0001 Reserved for inherently not codable concepts without codable children: Secondary | ICD-10-CM

## 2013-01-25 ENCOUNTER — Encounter: Payer: Self-pay | Admitting: Gynecology

## 2013-03-08 ENCOUNTER — Encounter: Payer: Self-pay | Admitting: Internal Medicine

## 2013-03-08 ENCOUNTER — Ambulatory Visit: Payer: BC Managed Care – PPO | Admitting: Internal Medicine

## 2013-03-08 VITALS — BP 120/74 | HR 72 | Temp 98.2°F | Wt 190.0 lb

## 2013-03-08 DIAGNOSIS — J209 Acute bronchitis, unspecified: Secondary | ICD-10-CM

## 2013-03-08 DIAGNOSIS — M545 Low back pain, unspecified: Secondary | ICD-10-CM

## 2013-03-08 DIAGNOSIS — J45909 Unspecified asthma, uncomplicated: Secondary | ICD-10-CM

## 2013-03-08 MED ORDER — LEVOFLOXACIN 500 MG PO TABS: 500.0000 mg | ORAL_TABLET | Freq: Every day | ORAL | Status: DC

## 2013-03-08 MED ORDER — PREDNISONE 10 MG PO TABS: 10.0000 mg | ORAL_TABLET | Freq: Every day | ORAL | Status: DC

## 2013-03-08 MED ORDER — ALBUTEROL SULFATE HFA 108 (90 BASE) MCG/ACT IN AERS: 2.0000 | INHALATION_SPRAY | Freq: Four times a day (QID) | RESPIRATORY_TRACT | Status: DC | PRN

## 2013-03-08 MED ORDER — HYDROCODONE-HOMATROPINE 5-1.5 MG/5ML PO SYRP: 5.0000 mL | ORAL_SOLUTION | Freq: Three times a day (TID) | ORAL | Status: DC | PRN

## 2013-03-08 NOTE — Progress Notes (Signed)
  Subjective:    Patient ID: Paula Bradley, female    DOB: 1961/09/03, 52 y.o.   MRN: [id number]  HPI 52 year old female with history of allergic rhinitis and asthma. She had onset of cough and congestion about a week ago. Thinks it was aggravated by working at a [employer]. No fever or shaking chills. Cough and congestion. In the meantime while she was coughing, she threw her back out. Has been seeing Dr. Genene Churn, chiropractor and has obtained some relief with that. She is intolerant of Advair. Her albuterol inhaler has expired. She is almost out of her hydrocodone cough syrup. Complaining of coughing and wheezing. Review of Systems     Objective:   Physical Exam Skin is warm and dry. HEENT exam: TMs and pharynx are clear. Neck is supple without significant adenopathy. Chest is clear to auscultation. Cardiac exam regular rate and rhythm normal S1 and S2. Extremities without edema. Straight leg raising is negative at 90 bilaterally; muscle strength is 5 over 5 in the lower extremities; and deep tendon reflexes 2+ and symmetrical.        Assessment & Plan:  Bronchitis  Asthma  Low back pain  Plan: Levaquin 500 milligrams daily for 7 days. Hycodan( 8 ounces) 1 teaspoon by mouth every 8 hours when necessary cough. Refill albuterol inhaler 2 sprays by mouth 4 times a day for one year. Sterapred DS 10 mg 6 day dosepak.

## 2013-03-08 NOTE — Patient Instructions (Addendum)
Take Levaquin 500 milligrams daily for 7 days. Use albuterol inhaler 4 times daily. Take Hycodan syrup sparingly for cough. Take prednisone dosepak.

## 2013-05-08 ENCOUNTER — Other Ambulatory Visit: Payer: BC Managed Care – PPO | Admitting: Internal Medicine

## 2013-05-10 ENCOUNTER — Encounter: Payer: BC Managed Care – PPO | Admitting: Internal Medicine

## 2013-05-23 ENCOUNTER — Encounter: Payer: Self-pay | Admitting: Gynecology

## 2013-05-23 ENCOUNTER — Ambulatory Visit (INDEPENDENT_AMBULATORY_CARE_PROVIDER_SITE_OTHER): Payer: BC Managed Care – PPO | Admitting: Gynecology

## 2013-05-23 VITALS — BP 116/74 | Ht 65.0 in | Wt 186.0 lb

## 2013-05-23 DIAGNOSIS — Z01419 Encounter for gynecological examination (general) (routine) without abnormal findings: Secondary | ICD-10-CM

## 2013-05-23 NOTE — Progress Notes (Signed)
Paula Bradley September 26, 1961 [id number]        52 y.o.  [id number] for annual exam.  Doing well without complaints.  Past medical history,surgical history, medications, allergies, family history and social history were all reviewed and documented in the EPIC chart. ROS:  Was performed and pertinent positives and negatives are included in the history.  Exam: Kim assistant Filed Vitals:   05/23/13 1023  BP: 116/74  Height: 5\' 5"  (1.651 m)  Weight: 186 lb (84.369 kg)   General appearance  Normal Skin grossly normal Head/Neck normal with no cervical or supraclavicular adenopathy thyroid normal Lungs  clear Cardiac RR, without RMG Abdominal  soft, nontender, without masses, organomegaly or hernia Breasts  examined lying and sitting without masses, retractions, discharge or axillary adenopathy. Pelvic  Ext/BUS/vagina  normal mild atrophic changes. Mild cystocele with straining  Cervix  normal   Uterus  anteverted, normal size, shape and contour, midline and mobile nontender   Adnexa  Without masses or tenderness    Anus and perineum  normal   Rectovaginal  normal sphincter tone without palpated masses or tenderness.    Assessment/Plan:  52 y.o. [id number] female for annual exam.   1. Postmenopausal. Had been on HRT. Stopped this past year on her own and has noticed no differences and feels well off of it.  I reviewed with her possible benefits of continuing versus the risks. She is comfortable staying off of this and we will follow. She knows to report any bleeding or if she has recurrence of postmenopausal symptoms. 2. Minimal cystocele. Patient asymptomatic without urinary incontinence or pressure symptoms. Plan observation with followup if she develops any issues. 3. Pap smear 2012. No Pap smear done today. Patient has a history of LGSIL 2004. ASCUS 2008 with negative high-risk HPV. Normal Pap smears 2008/10/06/12. Plan repeat Pap smear next year at 3 year interval. 4. Mammography 12/2012  normal. Continue with annual mammography. SBE monthly reviewed. 5. DEXA 2012 normal. Recommend repeat at age 62. Increase calcium vitamin D reviewed. 6. Colonoscopy 2011. Repeat at their recommended interval. 7. Health maintenance. Patient has appointment with her primary physician this coming month for annual exam and blood work. No lab work done today. Followup in one year, sooner as needed.     [name] MD, 11:05 AM 05/23/2013

## 2013-05-23 NOTE — Patient Instructions (Signed)
Followup in one year for annual exam. Sooner if any issues. 

## 2013-06-12 ENCOUNTER — Other Ambulatory Visit: Payer: BC Managed Care – PPO | Admitting: Internal Medicine

## 2013-06-12 ENCOUNTER — Other Ambulatory Visit: Payer: Self-pay | Admitting: Internal Medicine

## 2013-06-12 DIAGNOSIS — Z1329 Encounter for screening for other suspected endocrine disorder: Secondary | ICD-10-CM

## 2013-06-12 DIAGNOSIS — Z130 Encounter for screening for diseases of the blood and blood-forming organs and certain disorders involving the immune mechanism: Secondary | ICD-10-CM

## 2013-06-12 DIAGNOSIS — Z0000 Encounter for general adult medical examination without abnormal findings: Secondary | ICD-10-CM

## 2013-06-12 DIAGNOSIS — Z13220 Encounter for screening for lipoid disorders: Secondary | ICD-10-CM

## 2013-06-12 LAB — COMPREHENSIVE METABOLIC PANEL
ALT: 21 U/L (ref 0–35)
AST: 19 U/L (ref 0–37)
Albumin: 4.2 g/dL (ref 3.5–5.2)
Alkaline Phosphatase: 66 U/L (ref 39–117)
BUN: 17 mg/dL (ref 6–23)
CO2: 25 mEq/L (ref 19–32)
Calcium: 9.5 mg/dL (ref 8.4–10.5)
Chloride: 105 mEq/L (ref 96–112)
Creat: 0.68 mg/dL (ref 0.50–1.10)
Glucose, Bld: 112 mg/dL — ABNORMAL HIGH (ref 70–99)
Potassium: 4.2 mEq/L (ref 3.5–5.3)
Sodium: 140 mEq/L (ref 135–145)
Total Bilirubin: 0.7 mg/dL (ref 0.3–1.2)
Total Protein: 7.1 g/dL (ref 6.0–8.3)

## 2013-06-12 LAB — CBC WITH DIFFERENTIAL/PLATELET
Basophils Absolute: 0 10*3/uL (ref 0.0–0.1)
Basophils Relative: 0 % (ref 0–1)
Eosinophils Absolute: 0.1 10*3/uL (ref 0.0–0.7)
Eosinophils Relative: 2 % (ref 0–5)
HCT: 40 % (ref 36.0–46.0)
Hemoglobin: 13.6 g/dL (ref 12.0–15.0)
Lymphocytes Relative: 24 % (ref 12–46)
Lymphs Abs: 1.4 10*3/uL (ref 0.7–4.0)
MCH: 30.4 pg (ref 26.0–34.0)
MCHC: 34 g/dL (ref 30.0–36.0)
MCV: 89.5 fL (ref 78.0–100.0)
Monocytes Absolute: 0.7 10*3/uL (ref 0.1–1.0)
Monocytes Relative: 11 % (ref 3–12)
Neutro Abs: 3.7 10*3/uL (ref 1.7–7.7)
Neutrophils Relative %: 63 % (ref 43–77)
Platelets: 265 10*3/uL (ref 150–400)
RBC: 4.47 MIL/uL (ref 3.87–5.11)
RDW: 13.5 % (ref 11.5–15.5)
WBC: 5.9 10*3/uL (ref 4.0–10.5)

## 2013-06-12 LAB — LIPID PANEL
Cholesterol: 215 mg/dL — ABNORMAL HIGH (ref 0–200)
HDL: 69 mg/dL (ref >39)
LDL Cholesterol: 122 mg/dL — ABNORMAL HIGH (ref 0–99)
Total CHOL/HDL Ratio: 3.1 Ratio
Triglycerides: 121 mg/dL (ref <150)
VLDL: 24 mg/dL (ref 0–40)

## 2013-06-12 LAB — TSH: TSH: 2.124 u[IU]/mL (ref 0.350–4.500)

## 2013-06-13 LAB — VITAMIN D 25 HYDROXY (VIT D DEFICIENCY, FRACTURES): Vit D, 25-Hydroxy: 48 ng/mL (ref 30–89)

## 2013-06-18 ENCOUNTER — Encounter: Payer: Self-pay | Admitting: Internal Medicine

## 2013-06-18 ENCOUNTER — Ambulatory Visit (INDEPENDENT_AMBULATORY_CARE_PROVIDER_SITE_OTHER): Payer: BC Managed Care – PPO | Admitting: Internal Medicine

## 2013-06-18 VITALS — BP 110/78 | HR 68 | Ht 65.0 in | Wt 189.0 lb

## 2013-06-18 DIAGNOSIS — F1010 Alcohol abuse, uncomplicated: Secondary | ICD-10-CM

## 2013-06-18 DIAGNOSIS — F411 Generalized anxiety disorder: Secondary | ICD-10-CM

## 2013-06-18 DIAGNOSIS — E7800 Pure hypercholesterolemia, unspecified: Secondary | ICD-10-CM

## 2013-06-18 DIAGNOSIS — E785 Hyperlipidemia, unspecified: Secondary | ICD-10-CM

## 2013-06-18 DIAGNOSIS — Z0000 Encounter for general adult medical examination without abnormal findings: Secondary | ICD-10-CM

## 2013-06-18 DIAGNOSIS — E669 Obesity, unspecified: Secondary | ICD-10-CM

## 2013-06-18 DIAGNOSIS — J309 Allergic rhinitis, unspecified: Secondary | ICD-10-CM

## 2013-06-18 DIAGNOSIS — J45909 Unspecified asthma, uncomplicated: Secondary | ICD-10-CM

## 2013-06-18 LAB — POCT URINALYSIS DIPSTICK
Bilirubin, UA: NEGATIVE
Blood, UA: NEGATIVE
Glucose, UA: NEGATIVE
Ketones, UA: NEGATIVE
Leukocytes, UA: NEGATIVE
Nitrite, UA: NEGATIVE
Protein, UA: NEGATIVE
Spec Grav, UA: 1.015
Urobilinogen, UA: NEGATIVE
pH, UA: 7

## 2013-06-18 LAB — HEMOGLOBIN A1C
Hgb A1c MFr Bld: 5.2 % (ref <5.7)
Mean Plasma Glucose: 103 mg/dL (ref <117)

## 2013-06-18 NOTE — Progress Notes (Signed)
Subjective:    Patient ID: Paula Bradley, female    DOB: 13-Feb-1961, 52 y.o.   MRN: [id number]  HPI 52 year old White female for health maintenance and evaluation of medical problems. Patient has a history of hyperlipidemia, obesity, asthma, anxiety. She's been trying to watch her diet but confesses that she may drink one half to one bottle of wine at night mainly to treat her anxiety. She has a psychiatrist who has prescribed Klonopin for her for anxiety but she does not take that when she is drinking wine. Long-standing history of anxiety which she says she inherited from her father. Patient does not smoke.  Had colonoscopy 09/07/2010, influenza vaccine 08/27/2013, Pap smear by GYN 05/21/2013, mammogram up-to-date, tetanus immunization 2013.   Tonsillectomy 1985, C-section 1996. Fracture left hand in the remote past. History of allergic rhinitis. History of estrogen replacement. Also has taken Prozac for a number of years and also took Wellbutrin for a while.  Dr. Gigi Gin is her psychiatrist. Dr. Deirdre Evener her ophthalmologist. Dr. Terri Piedra is her dermatologist.  Patient is allergic to Advair, she says it causes times. Total of 4 pregnancies and 2 miscarriages.  Social history: She is married. Husband operate [employer]. They live in [place]. She has a daughter who is due to at [facility]. Son lives at home. Patient says she's had anxiety and depression since she was pregnant with her second child.  Family history: Both parents with strokes. Patient's sister lost a child in a drowning accident. Sr. subsequently had a nervous breakdown. Father with history of anxiety, hypertension. Mother with history of stroke, diabetes, MI, hypertension and goiter. Sister with hypertension. Brother with hypertension. Brother is an MI.  Patient received GYN care 3 gr per gynecology.  History of cerumen impaction treated by Dr. Haroldine Laws.    Review of Systems   Constitutional:       Says she gained 7 pounds on trip to [place] where she ate a lot.  HENT:       History of asthma and allergic rhinitis  Cardiovascular:       Walks 4 miles daily and denies chest pain or shortness of breath  Allergic/Immunologic: Positive for environmental allergies.  Psychiatric/Behavioral:       History of anxiety and depression       Objective:   Physical Exam  Vitals reviewed. Constitutional: She is oriented to person, place, and time. She appears well-developed and well-nourished. No distress.  HENT:  Head: Normocephalic and atraumatic.  Right Ear: External ear normal.  Left Ear: External ear normal.  Mouth/Throat: Oropharynx is clear and moist. No oropharyngeal exudate.  Eyes: Conjunctivae and EOM are normal. Pupils are equal, round, and reactive to light. Right eye exhibits no discharge. Left eye exhibits no discharge. No scleral icterus.  Cerumen in both ears  Neck: Neck supple. No JVD present. No thyromegaly present.  Cardiovascular: Normal rate, regular rhythm, normal heart sounds and intact distal pulses.   No murmur heard. Pulmonary/Chest: Effort normal and breath sounds normal. No respiratory distress. She has no wheezes. She has no rales. She exhibits no tenderness.  Breasts normal female  Abdominal: Soft. Bowel sounds are normal. She exhibits no distension and no mass. There is no tenderness. There is no rebound and no guarding.  Genitourinary:  Deferred to GYN  Musculoskeletal: She exhibits no edema.  Lymphadenopathy:    She has no cervical adenopathy.  Neurological: She is alert and oriented to person, place, and time. She has  normal reflexes. No cranial nerve deficit. Coordination normal.  Skin: Skin is warm and dry. No rash noted. She is not diaphoretic.  Psychiatric: She has a normal mood and affect. Her behavior is normal. Judgment and thought content normal.          Assessment & Plan:  History of anxiety and  depression  History of hyperlipidemia  History of nightly wine consumption  Obesity  Allergic rhinitis  History of asthma  Plan: Patient is continued wall 4 miles daily and stay on 1500-calorie diet. She is to decrease wine consumption - no more than  4 ounces 3 times weekly and not drink wine while taking clonazepam. She may take clonazepam at night instead of drinking wine. Recheck lipid panel in 6 months. Am not going to start lipid-lowering medication at this point in time because lipids are stable from 2012 although mildly elevated LDL.

## 2013-06-18 NOTE — Patient Instructions (Addendum)
Work on diet and exercise and repeat in 6 months. Decrease wine consumption. 1500-calorie diet.

## 2013-06-19 NOTE — Progress Notes (Signed)
Patient informed. 

## 2013-10-19 ENCOUNTER — Encounter: Payer: Self-pay | Admitting: Internal Medicine

## 2013-10-19 ENCOUNTER — Ambulatory Visit (INDEPENDENT_AMBULATORY_CARE_PROVIDER_SITE_OTHER): Payer: BC Managed Care – PPO | Admitting: Internal Medicine

## 2013-10-19 VITALS — BP 124/80 | HR 76 | Temp 98.2°F | Ht 65.0 in | Wt 192.0 lb

## 2013-10-19 DIAGNOSIS — J45909 Unspecified asthma, uncomplicated: Secondary | ICD-10-CM

## 2013-10-19 DIAGNOSIS — Z8709 Personal history of other diseases of the respiratory system: Secondary | ICD-10-CM

## 2013-10-19 DIAGNOSIS — J069 Acute upper respiratory infection, unspecified: Secondary | ICD-10-CM

## 2013-10-19 MED ORDER — METHYLPREDNISOLONE 4 MG PO TABS: 4.0000 mg | ORAL_TABLET | Freq: Every day | ORAL | Status: DC

## 2013-10-19 MED ORDER — HYDROCODONE-HOMATROPINE 5-1.5 MG/5ML PO SYRP: 5.0000 mL | ORAL_SOLUTION | Freq: Three times a day (TID) | ORAL | Status: DC | PRN

## 2013-10-19 MED ORDER — LEVOFLOXACIN 500 MG PO TABS: 500.0000 mg | ORAL_TABLET | Freq: Every day | ORAL | Status: DC

## 2013-10-27 NOTE — Progress Notes (Signed)
  Subjective:    Patient ID: Paula Bradley, female    DOB: Jan 26, 1961, 52 y.o.   MRN: [id number]  HPI Just returning from trip to the beach. Has come down with URI. Has cough and wheezing. No fever or shaking chills.    Review of Systems     Objective:   Physical Exam Skin warm and dry. Nodes none. Sounds hoarse and congested. TMs are clear. Pharynx slightly injected. Neck is supple without adenopathy. Chest clear without rales or wheezing.        Assessment & Plan:  Asthmatic bronchitis  Plan: Steroid dosepak. Levaquin 500 milligrams daily for 7 days. Use albuterol inhaler as directed. Hycodan 8 ounces 1 teaspoon by mouth every 8 hours when necessary cough

## 2013-10-27 NOTE — Patient Instructions (Signed)
Take steroids as prescribed. Use inhaler. Take antibiotics as prescribed.

## 2013-12-10 ENCOUNTER — Other Ambulatory Visit: Payer: BC Managed Care – PPO | Admitting: Internal Medicine

## 2013-12-11 ENCOUNTER — Ambulatory Visit: Payer: BC Managed Care – PPO | Admitting: Internal Medicine

## 2013-12-17 ENCOUNTER — Other Ambulatory Visit: Payer: BC Managed Care – PPO | Admitting: Internal Medicine

## 2013-12-18 ENCOUNTER — Ambulatory Visit: Payer: BC Managed Care – PPO | Admitting: Internal Medicine

## 2014-01-15 ENCOUNTER — Other Ambulatory Visit: Payer: BC Managed Care – PPO | Admitting: Internal Medicine

## 2014-01-17 ENCOUNTER — Ambulatory Visit: Payer: BC Managed Care – PPO | Admitting: Internal Medicine

## 2014-01-29 ENCOUNTER — Encounter: Payer: Self-pay | Admitting: Gynecology

## 2014-02-26 ENCOUNTER — Other Ambulatory Visit: Payer: 59 | Admitting: Internal Medicine

## 2014-02-26 DIAGNOSIS — R7301 Impaired fasting glucose: Secondary | ICD-10-CM

## 2014-02-26 DIAGNOSIS — E785 Hyperlipidemia, unspecified: Secondary | ICD-10-CM

## 2014-02-27 LAB — LIPID PANEL
Cholesterol: 212 mg/dL — ABNORMAL HIGH (ref 0–200)
HDL: 65 mg/dL (ref >39)
LDL Cholesterol: 115 mg/dL — ABNORMAL HIGH (ref 0–99)
Total CHOL/HDL Ratio: 3.3 Ratio
Triglycerides: 159 mg/dL — ABNORMAL HIGH (ref <150)
VLDL: 32 mg/dL (ref 0–40)

## 2014-02-27 LAB — HEMOGLOBIN A1C
Hgb A1c MFr Bld: 5.6 % (ref <5.7)
Mean Plasma Glucose: 114 mg/dL (ref <117)

## 2014-02-28 ENCOUNTER — Ambulatory Visit (INDEPENDENT_AMBULATORY_CARE_PROVIDER_SITE_OTHER): Payer: 59 | Admitting: Internal Medicine

## 2014-02-28 ENCOUNTER — Encounter: Payer: Self-pay | Admitting: Internal Medicine

## 2014-02-28 VITALS — BP 130/86 | HR 76 | Temp 98.7°F | Wt 193.0 lb

## 2014-02-28 DIAGNOSIS — M545 Low back pain, unspecified: Secondary | ICD-10-CM

## 2014-02-28 DIAGNOSIS — E785 Hyperlipidemia, unspecified: Secondary | ICD-10-CM

## 2014-02-28 DIAGNOSIS — Z8709 Personal history of other diseases of the respiratory system: Secondary | ICD-10-CM

## 2014-02-28 MED ORDER — ATORVASTATIN CALCIUM 10 MG PO TABS: 10.0000 mg | ORAL_TABLET | Freq: Every day | ORAL | Status: DC

## 2014-02-28 NOTE — Progress Notes (Signed)
   Subjective:    Patient ID: Paula Bradley, female    DOB: Dec 24, 1961, 53 y.o.   MRN: [id number]  HPI  For 6 month recheck. Injured back in a [place] in [place] in February. Saw chiropractor and is slowly getting better. Lipids are slightly elevated. Family history of brother with history of MI, and mother with history of CHF. Lipid panel is about the same. Hemoglobin A1c 5.6% and previously was 5.2%. Every time she comes she says she can go on a diet and lose 20 pounds but this never happens. 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 Son is a [employer] representativesenior in high school and has been partying in their home while they were away. Says she has a hangover today from drinking too much last evening. Says she needs to cut back on line consumption. Asked if she needed to see a counselor, said she used to see psychiatrist, Dr. Gigi GinAlexander Myers who has retired and she doesn't have one at present time but doesn't think she needs one.    Review of Systems     Objective:   Physical Exam  TMs are slightly full but not red. Neck supple. Chest clear. Cardiac exam regular rate and rhythm. Extremities without edema.      Assessment & Plan:  Mild hyperlipidemia but she does have family history of coronary artery disease. Mother had congestive heart failure and brother had massive MI.  Obesity  Glucose intolerance  Plan: Start Lipitor 10 mg daily. In 10 weeks,  she'll have fasting lipid panel and liver functions. She is to return in 6 months for physical examination. Encouraged diet exercise and weight loss. I think she would benefit from counseling regarding multiple issues going on in home life.

## 2014-02-28 NOTE — Patient Instructions (Addendum)
Come for lipid panel and liver functions in 10 weeks. Otherwise return in 6 months for physical examination. Start Lipitor 10 mg daily. Watch diet and exercise.

## 2014-03-18 ENCOUNTER — Ambulatory Visit (INDEPENDENT_AMBULATORY_CARE_PROVIDER_SITE_OTHER): Payer: 59 | Admitting: Internal Medicine

## 2014-03-18 ENCOUNTER — Encounter: Payer: Self-pay | Admitting: Internal Medicine

## 2014-03-18 VITALS — BP 120/78 | HR 68 | Temp 97.7°F | Wt 192.0 lb

## 2014-03-18 DIAGNOSIS — R05 Cough: Secondary | ICD-10-CM

## 2014-03-18 DIAGNOSIS — H6590 Unspecified nonsuppurative otitis media, unspecified ear: Secondary | ICD-10-CM

## 2014-03-18 DIAGNOSIS — J309 Allergic rhinitis, unspecified: Secondary | ICD-10-CM

## 2014-03-18 DIAGNOSIS — R059 Cough, unspecified: Secondary | ICD-10-CM

## 2014-03-18 DIAGNOSIS — H6593 Unspecified nonsuppurative otitis media, bilateral: Secondary | ICD-10-CM

## 2014-03-18 DIAGNOSIS — J45901 Unspecified asthma with (acute) exacerbation: Secondary | ICD-10-CM

## 2014-03-18 MED ORDER — ESOMEPRAZOLE MAGNESIUM 40 MG PO CPDR: 40.0000 mg | DELAYED_RELEASE_CAPSULE | Freq: Every day | ORAL | Status: DC

## 2014-03-18 MED ORDER — LEVOFLOXACIN 500 MG PO TABS: 500.0000 mg | ORAL_TABLET | Freq: Every day | ORAL | Status: DC

## 2014-03-18 MED ORDER — METHYLPREDNISOLONE 4 MG PO TABS
ORAL_TABLET | ORAL | Status: DC
Start: 2014-03-18 — End: 2014-05-29

## 2014-03-18 NOTE — Patient Instructions (Addendum)
Allergy and asthma evaluation will be arranged. Take Medrol as directed. Take Levaquin for an additional 7 days. Take Tagamet cough syrup and use albuterol inhaler. Could very well have an element of GE reflux. Start Nexium 40 mg daily.

## 2014-03-18 NOTE — Progress Notes (Signed)
   Subjective:    Patient ID: Paula Bradley, female    DOB: [birth year], 53 y.o.   MRN: [id number]  HPI   53 year old white female with history of asthma who has 2-3 episodes a yearly of cough and congestion. About 2 weeks ago patient felt some heaviness in her chest and began to cough. She had to go [place] reasons. While there, she got worse. She went to an urgent care and was placed on doxycycline 100 mg twice daily for 7 days. Within just a few days she was calling here saying she was no better. They did give her a short course of steroids orally and narcotic cough syrup. She says she is allergic to Advair that it has caused hives in the past but she can take oral steroids. She has a home nebulizer. She has an albuterol inhaler. She has never been allergy tested. She has a history of anxiety but no other significant medical problems. [facility] and was given an injection of steroids and started on Levaquin 500 milligrams daily for 5 days. Hydromet cough syrup was refilled.Says she's been coughing continuously. Can't seem to get any relief and is sore from coughing. Says her husband is displeased because he can't get any sleep. No fever or shaking chills. No significant sputum production.     Review of Systems     Objective:   Physical Exam TMs are full bilaterally but not oriented. Pharynx is clear. Neck is supple without adenopathy. Chest clear to auscultation without rales or wheezing. Skin is warm and dry.         Assessment & Plan:   Bilateral serous otitis media  Protracted cough  History of asthma  Plan: Medrol 4 mg 12 day dosepak going from 24 mg to 0 mg over 12 days. She has refill on Hydromet cough syrup. Continue albuterol inhaler or nebulizer as needed for cough. Refill Levaquin for an additional 7 days. Appointment with allergist for allergy testing as well as spirometry including   evaluation of possible asthma. I am not exactly sure she's truly allergic to Advair. Take Nexium 40 mg daily if she could possibly have an element of GE reflux.  25 minutes spent with patient

## 2014-03-28 ENCOUNTER — Telehealth: Payer: Self-pay

## 2014-03-28 NOTE — Telephone Encounter (Signed)
Patient informed. 

## 2014-03-28 NOTE — Telephone Encounter (Signed)
Patient states she is having awful cramps in her legs at night. She is thinking it is from Lipitor. Had stopped it once, then recently restarted it.

## 2014-03-28 NOTE — Telephone Encounter (Signed)
Try Magnesium supplement over the counter daily and continue Lipitor.

## 2014-04-11 ENCOUNTER — Telehealth: Payer: Self-pay | Admitting: Internal Medicine

## 2014-04-11 NOTE — Telephone Encounter (Signed)
See tomorrow

## 2014-04-11 NOTE — Telephone Encounter (Signed)
Spoke with patient; gave appointment for 4/17 @ 4:00 p.m.  Patient states she's going out of town to [place].  Wants to come as early as possible.  Explained to patient that I have no other available times for tomorrow.  I can put her on a waiting list should someone cancel and call her.  Patient advised that she cannot do the 4pm tomorrow with leaving to go out of town.  She'll take the offer for a cancellation and risk it.  If she has to, she'll go to an Urgent Care in [place] tomorrow.  Dr. Lenord FellersBaxley advised.

## 2014-04-12 ENCOUNTER — Ambulatory Visit: Payer: 59 | Admitting: Internal Medicine

## 2014-04-16 ENCOUNTER — Ambulatory Visit: Payer: 59 | Admitting: Internal Medicine

## 2014-05-10 ENCOUNTER — Other Ambulatory Visit: Payer: 59 | Admitting: Internal Medicine

## 2014-05-29 ENCOUNTER — Ambulatory Visit (INDEPENDENT_AMBULATORY_CARE_PROVIDER_SITE_OTHER): Payer: 59 | Admitting: Gynecology

## 2014-05-29 ENCOUNTER — Encounter: Payer: Self-pay | Admitting: Gynecology

## 2014-05-29 ENCOUNTER — Other Ambulatory Visit (HOSPITAL_COMMUNITY)
Admission: RE | Admit: 2014-05-29 | Discharge: 2014-05-29 | Disposition: A | Discharge status: Home / Self Care | Payer: 59 | Source: Ambulatory Visit | Attending: Gynecology | Admitting: Gynecology

## 2014-05-29 VITALS — BP 124/80 | Ht 65.5 in | Wt 193.0 lb

## 2014-05-29 DIAGNOSIS — Z01419 Encounter for gynecological examination (general) (routine) without abnormal findings: Secondary | ICD-10-CM

## 2014-05-29 DIAGNOSIS — R32 Unspecified urinary incontinence: Secondary | ICD-10-CM

## 2014-05-29 DIAGNOSIS — Z1151 Encounter for screening for human papillomavirus (HPV): Secondary | ICD-10-CM | POA: Insufficient documentation

## 2014-05-29 DIAGNOSIS — N8111 Cystocele, midline: Secondary | ICD-10-CM

## 2014-05-29 NOTE — Progress Notes (Signed)
Paula Bradley [birth year] [id number]        53 y.o.  [id number] for annual exam.  Several issues noted below.  Past medical history,surgical history, problem list, medications, allergies, family history and social history were all reviewed and documented as reviewed in the EPIC chart.  ROS:  12 system ROS performed with pertinent positives and negatives included in the history, assessment and plan.  Included Systems: General, HEENT, Neck, Cardiovascular, Pulmonary, Gastrointestinal, Genitourinary, Musculoskeletal, Dermatologic, Endocrine, Hematological, Neurologic, Psychiatric Additional significant findings :  Stress urinary incontinence symptoms discussed below   Exam: Kim assistant Filed Vitals:   05/29/14 0859  BP: 124/80  Height: 5' 5.5" (1.664 m)  Weight: 193 lb (87.544 kg)   General appearance:  Normal affect, orientation and appearance. Skin: Grossly normal HEENT: Without gross lesions.  No cervical or supraclavicular adenopathy. Thyroid normal.  Lungs:  Clear without wheezing, rales or rhonchi Cardiac: RR, without RMG Abdominal:  Soft, nontender, without masses, guarding, rebound, organomegaly or hernia Breasts:  Examined lying and sitting without masses, retractions, discharge or axillary adenopathy. Pelvic:  Ext/BUS/vagina with mild/minimal cystocele.  Cervix normal. Pap/HPV  Uterus anteverted, normal size, shape and contour, midline and mobile nontender   Adnexa  Without masses or tenderness    Anus and perineum  Normal   Rectovaginal  Normal sphincter tone without palpated masses or tenderness.    Assessment/Plan:  53 y.o. [id number] female for annual exam.   1. Postmenopausal. Patient doing well without significant hot flashes night sweats vaginal dryness or dyspareunia. No vaginal bleeding. Continue to monitor. Report any vaginal bleeding. 2. Mild/minimal cystocele. Symptoms of stress urinary incontinence. Options for management to include behavior modification, Kegel  exercises, surgery such as sling reviewed. Patient not interested in surgery but prefers behavior modification. Followup if desires to pursue surgery. Check urinalysis today. 3. Pap smear 2012. Pap/HPV today.  Patient has a history of LGSIL 2004. ASCUS 2008 with negative high-risk HPV. Normal Pap smears 2008/10/06/12. 4. DEXA 2012 normal. Plan repeat at age 33. Increased calcium and vitamin D reviewed. 5. Colonoscopy 2011. They did find a polyp. I asked her to call [facility] where she had it done to see when they wanted to repeat this and she's going to do this. 6. Mammography 01/2014. Continue with annual mammography. SBE monthly reviewed. 7. Health maintenance. No blood work done as patient reports this done through her primary physician's office. Followup one year, sooner as needed.   Note: This document was prepared with digital dictation and possible smart phrase technology. Any transcriptional errors that result from this process are unintentional.   [name] MD, 9:32 AM 05/29/2014

## 2014-05-29 NOTE — Patient Instructions (Addendum)
Followup in one year, sooner as needed.  You may obtain a copy of any labs that were done today by logging onto MyChart as outlined in the instructions provided with your AVS (after visit summary). The office will not call with normal lab results but certainly if there are any significant abnormalities then we will contact you.   Health Maintenance, Female A healthy lifestyle and preventative care can promote health and wellness.  Maintain regular health, dental, and eye exams.  Eat a healthy diet. Foods like vegetables, fruits, whole grains, low-fat dairy products, and lean protein foods contain the nutrients you need without too many calories. Decrease your intake of foods high in solid fats, added sugars, and salt. Get information about a proper diet from your caregiver, if necessary.  Regular physical exercise is one of the most important things you can do for your health. Most adults should get at least 150 minutes of moderate-intensity exercise (any activity that increases your heart rate and causes you to sweat) each week. In addition, most adults need muscle-strengthening exercises on 2 or more days a week.   Maintain a healthy weight. The body mass index (BMI) is a screening tool to identify possible weight problems. It provides an estimate of body fat based on height and weight. Your caregiver can help determine your BMI, and can help you achieve or maintain a healthy weight. For adults 20 years and older:  A BMI below 18.5 is considered underweight.  A BMI of 18.5 to 24.9 is normal.  A BMI of 25 to 29.9 is considered overweight.  A BMI of 30 and above is considered obese.  Maintain normal blood lipids and cholesterol by exercising and minimizing your intake of saturated fat. Eat a balanced diet with plenty of fruits and vegetables. Blood tests for lipids and cholesterol should begin at age 20 and be repeated every 5 years. If your lipid or cholesterol levels are high, you are over  50, or you are a high risk for heart disease, you may need your cholesterol levels checked more frequently.Ongoing high lipid and cholesterol levels should be treated with medicines if diet and exercise are not effective.  If you smoke, find out from your caregiver how to quit. If you do not use tobacco, do not start.  Lung cancer screening is recommended for adults aged 55 80 years who are at high risk for developing lung cancer because of a history of smoking. Yearly low-dose computed tomography (CT) is recommended for people who have at least a 30-pack-year history of smoking and are a current smoker or have quit within the past 15 years. A pack year of smoking is smoking an average of 1 pack of cigarettes a day for 1 year (for example: 1 pack a day for 30 years or 2 packs a day for 15 years). Yearly screening should continue until the smoker has stopped smoking for at least 15 years. Yearly screening should also be stopped for people who develop a health problem that would prevent them from having lung cancer treatment.  If you are pregnant, do not drink alcohol. If you are breastfeeding, be very cautious about drinking alcohol. If you are not pregnant and choose to drink alcohol, do not exceed 1 drink per day. One drink is considered to be 12 ounces (355 mL) of beer, 5 ounces (148 mL) of wine, or 1.5 ounces (44 mL) of liquor.  Avoid use of street drugs. Do not share needles with anyone. Ask for help   help if you need support or instructions about stopping the use of drugs.  High blood pressure causes heart disease and increases the risk of stroke. Blood pressure should be checked at least every 1 to 2 years. Ongoing high blood pressure should be treated with medicines, if weight loss and exercise are not effective.  If you are 55 to 55 years old, ask your caregiver if you should take aspirin to prevent strokes.  Diabetes screening involves taking a blood sample to check your fasting blood sugar  level. This should be done once every 3 years, after age 45, if you are within normal weight and without risk factors for diabetes. Testing should be considered at a younger age or be carried out more frequently if you are overweight and have at least 1 risk factor for diabetes.  Breast cancer screening is essential preventative care for women. You should practice "breast self-awareness." This means understanding the normal appearance and feel of your breasts and may include breast self-examination. Any changes detected, no matter how small, should be reported to a caregiver. Women in their 20s and 30s should have a clinical breast exam (CBE) by a caregiver as part of a regular health exam every 1 to 3 years. After age 40, women should have a CBE every year. Starting at age 40, women should consider having a mammogram (breast X-ray) every year. Women who have a family history of breast cancer should talk to their caregiver about genetic screening. Women at a high risk of breast cancer should talk to their caregiver about having an MRI and a mammogram every year.  Breast cancer gene (BRCA)-related cancer risk assessment is recommended for women who have family members with BRCA-related cancers. BRCA-related cancers include breast, ovarian, tubal, and peritoneal cancers. Having family members with these cancers may be associated with an increased risk for harmful changes (mutations) in the breast cancer genes BRCA1 and BRCA2. Results of the assessment will determine the need for genetic counseling and BRCA1 and BRCA2 testing.  The Pap test is a screening test for cervical cancer. Women should have a Pap test starting at age 21. Between ages 21 and 29, Pap tests should be repeated every 2 years. Beginning at age 30, you should have a Pap test every 3 years as long as the past 3 Pap tests have been normal. If you had a hysterectomy for a problem that was not cancer or a condition that could lead to cancer, then  you no longer need Pap tests. If you are between ages 65 and 70, and you have had normal Pap tests going back 10 years, you no longer need Pap tests. If you have had past treatment for cervical cancer or a condition that could lead to cancer, you need Pap tests and screening for cancer for at least 20 years after your treatment. If Pap tests have been discontinued, risk factors (such as a new sexual partner) need to be reassessed to determine if screening should be resumed. Some women have medical problems that increase the chance of getting cervical cancer. In these cases, your caregiver may recommend more frequent screening and Pap tests.  The human papillomavirus (HPV) test is an additional test that may be used for cervical cancer screening. The HPV test looks for the virus that can cause the cell changes on the cervix. The cells collected during the Pap test can be tested for HPV. The HPV test could be used to screen women aged 30 years and older,   and should be used in women of any age who have unclear Pap test results. After the age of 30, women should have HPV testing at the same frequency as a Pap test.  Colorectal cancer can be detected and often prevented. Most routine colorectal cancer screening begins at the age of 50 and continues through age 75. However, your caregiver may recommend screening at an earlier age if you have risk factors for colon cancer. On a yearly basis, your caregiver may provide home test kits to check for hidden blood in the stool. Use of a small camera at the end of a tube, to directly examine the colon (sigmoidoscopy or colonoscopy), can detect the earliest forms of colorectal cancer. Talk to your caregiver about this at age 50, when routine screening begins. Direct examination of the colon should be repeated every 5 to 10 years through age 75, unless early forms of pre-cancerous polyps or small growths are found.  Hepatitis C blood testing is recommended for all people born  from 1945 through 1965 and any individual with known risks for hepatitis C.  Practice safe sex. Use condoms and avoid high-risk sexual practices to reduce the spread of sexually transmitted infections (STIs). Sexually active women aged 25 and younger should be checked for Chlamydia, which is a common sexually transmitted infection. Older women with new or multiple partners should also be tested for Chlamydia. Testing for other STIs is recommended if you are sexually active and at increased risk.  Osteoporosis is a disease in which the bones lose minerals and strength with aging. This can result in serious bone fractures. The risk of osteoporosis can be identified using a bone density scan. Women ages 65 and over and women at risk for fractures or osteoporosis should discuss screening with their caregivers. Ask your caregiver whether you should be taking a calcium supplement or vitamin D to reduce the rate of osteoporosis.  Menopause can be associated with physical symptoms and risks. Hormone replacement therapy is available to decrease symptoms and risks. You should talk to your caregiver about whether hormone replacement therapy is right for you.  Use sunscreen. Apply sunscreen liberally and repeatedly throughout the day. You should seek shade when your shadow is shorter than you. Protect yourself by wearing long sleeves, pants, a wide-brimmed hat, and sunglasses year round, whenever you are outdoors.  Notify your caregiver of new moles or changes in moles, especially if there is a change in shape or color. Also notify your caregiver if a mole is larger than the size of a pencil eraser.  Stay current with your immunizations. Document Released: 06/28/2011 Document Revised: 04/09/2013 Document Reviewed: 06/28/2011 [employer] Patient Information 2014 [employer].   

## 2014-05-29 NOTE — Addendum Note (Signed)
Addended by: Dayna Barker on: 05/29/2014 09:46 AM   Modules accepted: Orders

## 2014-05-30 LAB — CYTOLOGY - PAP

## 2014-05-30 LAB — URINALYSIS W MICROSCOPIC + REFLEX CULTURE
Bacteria, UA: NONE SEEN
Bilirubin Urine: NEGATIVE
Casts: NONE SEEN
Crystals: NONE SEEN
Glucose, UA: NEGATIVE mg/dL
Hgb urine dipstick: NEGATIVE
Ketones, ur: NEGATIVE mg/dL
Leukocytes, UA: NEGATIVE
Nitrite: NEGATIVE
Protein, ur: NEGATIVE mg/dL
Specific Gravity, Urine: 1.02 (ref 1.005–1.030)
Squamous Epithelial / LPF: NONE SEEN
Urobilinogen, UA: 0.2 mg/dL (ref 0.0–1.0)
pH: 7 (ref 5.0–8.0)

## 2014-06-04 ENCOUNTER — Telehealth: Payer: Self-pay | Admitting: *Deleted

## 2014-06-04 NOTE — Telephone Encounter (Signed)
Pt called lab results given for OV 05/29/14. Pt noticed some spotting and tenderness since exam, will watch for now, OV if persists.

## 2014-06-07 ENCOUNTER — Other Ambulatory Visit: Payer: 59 | Admitting: Internal Medicine

## 2014-06-07 DIAGNOSIS — Z79899 Other long term (current) drug therapy: Secondary | ICD-10-CM

## 2014-06-07 DIAGNOSIS — E785 Hyperlipidemia, unspecified: Secondary | ICD-10-CM

## 2014-06-08 LAB — HEPATIC FUNCTION PANEL
ALT: 20 U/L (ref 0–35)
AST: 19 U/L (ref 0–37)
Albumin: 4.3 g/dL (ref 3.5–5.2)
Alkaline Phosphatase: 63 U/L (ref 39–117)
Bilirubin, Direct: 0.1 mg/dL (ref 0.0–0.3)
Indirect Bilirubin: 0.5 mg/dL (ref 0.2–1.2)
Total Bilirubin: 0.6 mg/dL (ref 0.2–1.2)
Total Protein: 6.9 g/dL (ref 6.0–8.3)

## 2014-06-08 LAB — LIPID PANEL
Cholesterol: 171 mg/dL (ref 0–200)
HDL: 74 mg/dL (ref >39)
LDL Cholesterol: 79 mg/dL (ref 0–99)
Total CHOL/HDL Ratio: 2.3 Ratio
Triglycerides: 91 mg/dL (ref <150)
VLDL: 18 mg/dL (ref 0–40)

## 2014-06-12 ENCOUNTER — Telehealth: Payer: Self-pay | Admitting: Internal Medicine

## 2014-06-13 NOTE — Telephone Encounter (Signed)
Patient states she had only been off of Lipitor for 4 days when she had her labs drawn.  She wants to come off of the Lipitor for 3 months and then come back for re-check and OV.  Fasting Lipid/Liver scheduled for 9/21 and 3 mo f/u visit on 9/22 @ 10:45 a.m.  Patient will be OFF of Lipitor from now until September appointment.  Patient was advised to watch her diet.  Patient verbalized understanding of these instructions.

## 2014-06-13 NOTE — Telephone Encounter (Signed)
It was my understanding that she had been off Lipitor when labs were drawn on June 12th. Not sure how long she was off. Her lipids were normal at that time. If she was off Lipitor for several weeks perhaps she could just recheck in 3 months off statin meds. If there is some confusion and she has been taking Lipitor, she should stop it for 3 months and see what happens. I am happy to discuss at office visit in the next 2 weeks.

## 2014-08-12 ENCOUNTER — Encounter: Payer: Self-pay | Admitting: Internal Medicine

## 2014-08-12 ENCOUNTER — Ambulatory Visit (INDEPENDENT_AMBULATORY_CARE_PROVIDER_SITE_OTHER): Payer: 59 | Admitting: Internal Medicine

## 2014-08-12 VITALS — BP 124/86 | HR 92 | Temp 99.2°F | Wt 191.0 lb

## 2014-08-12 DIAGNOSIS — F32A Depression, unspecified: Secondary | ICD-10-CM

## 2014-08-12 DIAGNOSIS — F411 Generalized anxiety disorder: Secondary | ICD-10-CM

## 2014-08-12 DIAGNOSIS — Z638 Other specified problems related to primary support group: Secondary | ICD-10-CM

## 2014-08-12 DIAGNOSIS — F329 Major depressive disorder, single episode, unspecified: Secondary | ICD-10-CM

## 2014-08-12 DIAGNOSIS — E785 Hyperlipidemia, unspecified: Secondary | ICD-10-CM

## 2014-08-12 DIAGNOSIS — Z600 Problems of adjustment to life-cycle transitions: Secondary | ICD-10-CM

## 2014-08-12 DIAGNOSIS — F3289 Other specified depressive episodes: Secondary | ICD-10-CM

## 2014-08-12 MED ORDER — FLUOXETINE HCL 20 MG PO TABS: 20.0000 mg | ORAL_TABLET | Freq: Every day | ORAL | Status: DC

## 2014-08-27 ENCOUNTER — Other Ambulatory Visit: Payer: Self-pay

## 2014-08-27 ENCOUNTER — Telehealth: Payer: Self-pay | Admitting: Internal Medicine

## 2014-08-27 MED ORDER — CLONAZEPAM 1 MG PO TABS: 1.0000 mg | ORAL_TABLET | Freq: Two times a day (BID) | ORAL | Status: DC | PRN

## 2014-08-27 NOTE — Telephone Encounter (Signed)
Pharmacy:  [facility] @ [address]  [phone]).  She doesn't need refill on the Prozac at this time.  However, she is almost out of her Klonopin and needs a refill on that.    Thanks.

## 2014-08-27 NOTE — Telephone Encounter (Signed)
Patient scheduled next week for 1 mo f/u; advised we'll R/S for 6 weeks.  Patient also scheduled for 3 mo f/u on hyperlipidemia (labs 9/21, appt 9/22).  Advised we'll move everything to mid October and f/u on anti anxiety, depressants and lipid all at once.  Patient verbalized understanding of these changes.   Appointment given for fasting labs on 10/12 and appointment on 10/13 @ 2:45.

## 2014-08-27 NOTE — Telephone Encounter (Signed)
Please prescribe for 30 days with one refill. Follow up in 6 weeks here

## 2014-09-05 ENCOUNTER — Ambulatory Visit: Payer: 59 | Admitting: Internal Medicine

## 2014-09-06 ENCOUNTER — Telehealth: Payer: Self-pay

## 2014-09-06 ENCOUNTER — Encounter: Payer: Self-pay | Admitting: Internal Medicine

## 2014-09-06 ENCOUNTER — Ambulatory Visit (INDEPENDENT_AMBULATORY_CARE_PROVIDER_SITE_OTHER): Payer: 59 | Admitting: Internal Medicine

## 2014-09-06 ENCOUNTER — Other Ambulatory Visit: Payer: Self-pay | Admitting: Internal Medicine

## 2014-09-06 VITALS — BP 110/78 | HR 62 | Temp 97.5°F | Ht 65.0 in | Wt 188.0 lb

## 2014-09-06 DIAGNOSIS — R1031 Right lower quadrant pain: Secondary | ICD-10-CM

## 2014-09-06 LAB — POCT URINALYSIS DIPSTICK
Bilirubin, UA: NEGATIVE
Blood, UA: NEGATIVE
Glucose, UA: NEGATIVE
Ketones, UA: NEGATIVE
Leukocytes, UA: NEGATIVE
Nitrite, UA: NEGATIVE
Protein, UA: NEGATIVE
Spec Grav, UA: 1.015
Urobilinogen, UA: NEGATIVE
pH, UA: 6

## 2014-09-06 LAB — CBC WITH DIFFERENTIAL/PLATELET
Basophils Absolute: 0 10*3/uL (ref 0.0–0.1)
Basophils Relative: 0 % (ref 0–1)
Eosinophils Absolute: 0.1 10*3/uL (ref 0.0–0.7)
Eosinophils Relative: 1 % (ref 0–5)
HCT: 39.1 % (ref 36.0–46.0)
Hemoglobin: 13.5 g/dL (ref 12.0–15.0)
Lymphocytes Relative: 26 % (ref 12–46)
Lymphs Abs: 1.4 10*3/uL (ref 0.7–4.0)
MCH: 30.2 pg (ref 26.0–34.0)
MCHC: 34.5 g/dL (ref 30.0–36.0)
MCV: 87.5 fL (ref 78.0–100.0)
Monocytes Absolute: 0.8 10*3/uL (ref 0.1–1.0)
Monocytes Relative: 15 % — ABNORMAL HIGH (ref 3–12)
Neutro Abs: 3.1 10*3/uL (ref 1.7–7.7)
Neutrophils Relative %: 58 % (ref 43–77)
Platelets: 279 10*3/uL (ref 150–400)
RBC: 4.47 MIL/uL (ref 3.87–5.11)
RDW: 13.1 % (ref 11.5–15.5)
WBC: 5.3 10*3/uL (ref 4.0–10.5)

## 2014-09-06 LAB — AMYLASE: Amylase: 35 U/L (ref 0–105)

## 2014-09-06 LAB — LIPASE: Lipase: 31 U/L (ref 0–75)

## 2014-09-06 NOTE — Telephone Encounter (Signed)
Hida scan scheduled for Sept 18th at 7 am at [facility].  Patient is to arrive at 45 with nothing to eat or drink after midnight.  Left message informing patient.

## 2014-09-13 ENCOUNTER — Other Ambulatory Visit: Payer: Self-pay | Admitting: Internal Medicine

## 2014-09-13 ENCOUNTER — Telehealth: Payer: Self-pay

## 2014-09-13 ENCOUNTER — Ambulatory Visit (HOSPITAL_COMMUNITY)
Admission: RE | Admit: 2014-09-13 | Discharge: 2014-09-13 | Disposition: A | Discharge status: Home / Self Care | Payer: 59 | Source: Ambulatory Visit | Attending: Internal Medicine | Admitting: Internal Medicine

## 2014-09-13 DIAGNOSIS — R1031 Right lower quadrant pain: Secondary | ICD-10-CM

## 2014-09-13 DIAGNOSIS — R110 Nausea: Secondary | ICD-10-CM | POA: Insufficient documentation

## 2014-09-13 MED ORDER — TECHNETIUM TC 99M MEBROFENIN IV KIT
5.0000 | PACK | Freq: Once | INTRAVENOUS | Status: AC | PRN
  Administered 2014-09-13: 5 via INTRAVENOUS

## 2014-09-13 MED ORDER — STERILE WATER FOR INJECTION IJ SOLN
INTRAMUSCULAR | Status: AC
  Administered 2014-09-13: 10 mL
  Filled 2014-09-13: qty 10

## 2014-09-13 MED ORDER — SINCALIDE 5 MCG IJ SOLR
0.0200 ug/kg | Freq: Once | INTRAMUSCULAR | Status: AC
  Administered 2014-09-13: 1.7 ug via INTRAVENOUS

## 2014-09-13 MED ORDER — SINCALIDE 5 MCG IJ SOLR
INTRAMUSCULAR | Status: AC
  Administered 2014-09-13: 1.7 ug via INTRAVENOUS
  Filled 2014-09-13: qty 5

## 2014-09-13 NOTE — Telephone Encounter (Signed)
Message copied by Judd Gaudier on Fri Sep 13, 2014  2:10 PM ------      Message from: Margaree Mackintosh      Created: Fri Sep 13, 2014  1:01 PM       Please call patient. Hepatobiliary scan is normal. It is functioning well. No evidence of chronic cholecystitis. ------

## 2014-09-13 NOTE — Telephone Encounter (Signed)
Patient informed of imaging results.

## 2014-09-16 ENCOUNTER — Other Ambulatory Visit: Payer: 59 | Admitting: Internal Medicine

## 2014-09-17 ENCOUNTER — Ambulatory Visit: Payer: 59 | Admitting: Internal Medicine

## 2014-09-22 DIAGNOSIS — E785 Hyperlipidemia, unspecified: Secondary | ICD-10-CM | POA: Insufficient documentation

## 2014-09-22 NOTE — Progress Notes (Signed)
   Subjective:    Patient ID: Paula Bradley, female    DOB: Jul 31, 1961, 53 y.o.   MRN: [id number]  HPI  Patient asked to come in to be seen on an acute basis regarding some symptoms of anxiety and depression. Her son is gone off college. Her daughter has graduated from college and is now living in the [place] area. She has begun to feel "empty nest syndrome ".  She seems surprised that she's feeling this way. Has been sad. Feels that she really doesn't have a lot to look forwards to. 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 She is a bit tearful in the office today. Denies any suicidal ideations.  Review of Systems     Objective:   Physical Exam  Spent 25 minutes speaking with patient about her concerns and issues. She was not examined today.      Assessment & Plan:  Depression  Anxiety  Empty nest syndrome-refer to Ollen Gross for counseling  Plan: Prozac 20 mg daily. Klonopin 1 mg twice daily for anxiety. Refer for counseling. Return in 4-6 weeks.

## 2014-09-22 NOTE — Patient Instructions (Addendum)
Take Prozac 20 mg daily. Take Klonopin 1 mg twice daily. Refer for counseling. Return in 4-6 weeks.

## 2014-09-25 NOTE — Patient Instructions (Signed)
Advance diet slowly. Consider possibility of lactose intolerance. Had hepatobiliary scan. May need GI consultation.

## 2014-09-25 NOTE — Progress Notes (Signed)
   Subjective:    Patient ID: Paula Bradley, female    DOB: [birth year], 53 y.o.   MRN: [id number]  HPI  Ms. Revonda Standardllison went to the beach recently. She had to attend a funeral on her way to the beach. It was a stressful time. 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 She did not feel well. She complained of nausea but had no vomiting or diarrhea. History of GE reflux for which she takes Protonix. Can concern she could be having cardiac pain although it was mostly epigastric. [facility] [place]. She had an ultrasound of the upper abdomen that showed fatty liver otherwise negative. MI was ruled out. Chest x-ray was negative. CBC showed a white blood cell count of 11,000. Hemoglobin was 13.5 g. BUN and creatinine normal. Liver functions normal. Urine specimen negative for infection. She is a nonsmoker. Does consume wine. Status post cesarean section and history of tonsillectomy.  History of anxiety and asthma. EKG showed sinus rhythm. Patient was treated with oxycodone APAP and Zofran. She improved over the next 24 hours. However still complains of some issues with vague epigastric pain. Has had some diarrhea. Recently was placed on Prozac for itchiness syndrome and is seeing counselor, Ollen GrossEllen Wilson. Always seems to be stressed and anxious.  Nausea has resolved. She's not been eating very much. She's afraid she'll have multiple episodes of diarrhea. Generally having about 2-3 episodes a day.  Review of Systems     Objective:   Physical Exam  Neck is supple without JVD thyromegaly or carotid bruits. Chest clear. Cardiac exam regular rate and rhythm normal S1 and S2. Abdomen no hepatosplenomegaly masses or significant tenderness.      Assessment & Plan:  Epigastric pain-MI ruled out. Ultrasound of gallbladder was negative but the emergency physician mentioned having hepatobiliary scan so we will order that.  Possible irritable  bowel syndrome. This would explain the diarrhea. Talked with her about food choices. She is to consider possibility of lactose intolerance. Doubt this is infectious diarrhea. No blood in stool. Doesn't have lots of bowel movements daily.  Anxiety depression-currently under treatment and counseling  Plan: Patient will advance her diet slowly over the next several days. She'll have a hepatobiliary scan. Consider referral to GI.  40 minutes spent with patient

## 2014-10-07 ENCOUNTER — Other Ambulatory Visit: Payer: 59 | Admitting: Internal Medicine

## 2014-10-08 ENCOUNTER — Ambulatory Visit: Payer: 59 | Admitting: Internal Medicine

## 2014-10-18 ENCOUNTER — Other Ambulatory Visit: Payer: 59 | Admitting: Internal Medicine

## 2014-10-18 DIAGNOSIS — E785 Hyperlipidemia, unspecified: Secondary | ICD-10-CM

## 2014-10-18 LAB — HEPATIC FUNCTION PANEL
ALT: 22 U/L (ref 0–35)
AST: 19 U/L (ref 0–37)
Albumin: 4.1 g/dL (ref 3.5–5.2)
Alkaline Phosphatase: 67 U/L (ref 39–117)
Bilirubin, Direct: 0.1 mg/dL (ref 0.0–0.3)
Indirect Bilirubin: 0.6 mg/dL (ref 0.2–1.2)
Total Bilirubin: 0.7 mg/dL (ref 0.2–1.2)
Total Protein: 6.9 g/dL (ref 6.0–8.3)

## 2014-10-18 LAB — LIPID PANEL
Cholesterol: 222 mg/dL — ABNORMAL HIGH (ref 0–200)
HDL: 78 mg/dL (ref >39)
LDL Cholesterol: 118 mg/dL — ABNORMAL HIGH (ref 0–99)
Total CHOL/HDL Ratio: 2.8 Ratio
Triglycerides: 128 mg/dL (ref <150)
VLDL: 26 mg/dL (ref 0–40)

## 2014-10-22 ENCOUNTER — Other Ambulatory Visit: Payer: 59 | Admitting: Internal Medicine

## 2014-10-24 ENCOUNTER — Encounter: Payer: Self-pay | Admitting: Internal Medicine

## 2014-10-24 ENCOUNTER — Ambulatory Visit (INDEPENDENT_AMBULATORY_CARE_PROVIDER_SITE_OTHER): Payer: 59 | Admitting: Internal Medicine

## 2014-10-24 VITALS — BP 110/70 | HR 68 | Temp 98.9°F | Wt 192.0 lb

## 2014-10-24 DIAGNOSIS — F418 Other specified anxiety disorders: Secondary | ICD-10-CM

## 2014-10-24 DIAGNOSIS — F329 Major depressive disorder, single episode, unspecified: Secondary | ICD-10-CM

## 2014-10-24 DIAGNOSIS — E785 Hyperlipidemia, unspecified: Secondary | ICD-10-CM

## 2014-10-24 DIAGNOSIS — F32A Depression, unspecified: Secondary | ICD-10-CM

## 2014-10-24 DIAGNOSIS — F419 Anxiety disorder, unspecified: Secondary | ICD-10-CM

## 2014-10-24 DIAGNOSIS — H6593 Unspecified nonsuppurative otitis media, bilateral: Secondary | ICD-10-CM

## 2014-10-24 DIAGNOSIS — J069 Acute upper respiratory infection, unspecified: Secondary | ICD-10-CM

## 2014-10-24 MED ORDER — LEVOFLOXACIN 500 MG PO TABS
500.0000 mg | ORAL_TABLET | Freq: Every day | ORAL | Status: DC
Start: 2014-10-24 — End: 2015-01-21

## 2014-10-24 MED ORDER — SIMVASTATIN 20 MG PO TABS: 20.0000 mg | ORAL_TABLET | Freq: Every day | ORAL | Status: DC

## 2014-10-24 NOTE — Progress Notes (Signed)
   Subjective:    Patient ID: Paula Bradley, female    DOB: 08-24-1961, 53 y.o.   MRN: [id number]  HPI  Off generic Lipitor for several months because of muscle aches. These got better off statin. Wants to try another statin. Starting Nutrisystem soon.  Friend wants her to try Simvastatin. Onset URI symtoms 3 days ago with sore throat. Using inhalers. Coughing up discolored sputum. Feeling better with depression symptoms.    Review of Systems     Objective:   Physical Exam  Vitals reviewed. Constitutional: She appears well-developed and well-nourished. No distress.  HENT:  Right Ear: External ear normal.  Left Ear: External ear normal.  Mouth/Throat: Oropharynx is clear and moist. No oropharyngeal exudate.  TMs full bilaterally but not red.  Pulmonary/Chest: Effort normal and breath sounds normal. No respiratory distress. She has no wheezes. She has no rales.  Lymphadenopathy:    She has no cervical adenopathy.  Skin: Skin is warm and dry. She is not diaphoretic.  Psychiatric: She has a normal mood and affect. Her behavior is normal. Judgment and thought content normal.            Assessment & Plan:  Acute URI Acute serous BOM Depression Hyperlipidemia 25 minutes spent with patient  Plan: Continue Prozac and Klonopin. Levaquin 500 mg daily x 10 days. Use inhalers previously prescribed.  Try Nutrisystem. Change to Zocor 20 mg daily. RTC in 3 months. To have lipid/ liver panels and OV in 3 months as long as

## 2014-10-24 NOTE — Patient Instructions (Signed)
Take Levaquin as directed. Use inhalers. Start Zocor for lipids RTC in 3 months

## 2014-10-28 ENCOUNTER — Encounter: Payer: Self-pay | Admitting: Internal Medicine

## 2015-01-17 ENCOUNTER — Other Ambulatory Visit: Payer: 59 | Admitting: Internal Medicine

## 2015-01-20 ENCOUNTER — Other Ambulatory Visit: Payer: Self-pay | Admitting: Internal Medicine

## 2015-01-20 DIAGNOSIS — Z0000 Encounter for general adult medical examination without abnormal findings: Secondary | ICD-10-CM

## 2015-01-20 DIAGNOSIS — Z13220 Encounter for screening for lipoid disorders: Secondary | ICD-10-CM

## 2015-01-20 LAB — LIPID PANEL
Cholesterol: 177 mg/dL (ref 0–200)
HDL: 74 mg/dL (ref >39)
LDL Cholesterol: 75 mg/dL (ref 0–99)
Total CHOL/HDL Ratio: 2.4 Ratio
Triglycerides: 141 mg/dL (ref <150)
VLDL: 28 mg/dL (ref 0–40)

## 2015-01-20 LAB — HEPATIC FUNCTION PANEL
ALT: 25 U/L (ref 0–35)
AST: 19 U/L (ref 0–37)
Albumin: 4.1 g/dL (ref 3.5–5.2)
Alkaline Phosphatase: 64 U/L (ref 39–117)
Bilirubin, Direct: 0.1 mg/dL (ref 0.0–0.3)
Indirect Bilirubin: 0.4 mg/dL (ref 0.2–1.2)
Total Bilirubin: 0.5 mg/dL (ref 0.2–1.2)
Total Protein: 6.9 g/dL (ref 6.0–8.3)

## 2015-01-20 NOTE — Progress Notes (Signed)
Patient changed her legal name had to reorder lab work under correct name

## 2015-01-20 NOTE — Addendum Note (Signed)
Addended by: Thomasena EdisWILLIAMS, Xylon Croom N on: 01/20/2015 09:47 AM   Modules accepted: Orders

## 2015-01-21 ENCOUNTER — Ambulatory Visit (INDEPENDENT_AMBULATORY_CARE_PROVIDER_SITE_OTHER): Payer: 59 | Admitting: Internal Medicine

## 2015-01-21 ENCOUNTER — Encounter: Payer: Self-pay | Admitting: Internal Medicine

## 2015-01-21 VITALS — BP 112/72 | HR 64 | Temp 97.6°F | Wt 182.0 lb

## 2015-01-21 DIAGNOSIS — F329 Major depressive disorder, single episode, unspecified: Secondary | ICD-10-CM

## 2015-01-21 DIAGNOSIS — K219 Gastro-esophageal reflux disease without esophagitis: Secondary | ICD-10-CM

## 2015-01-21 DIAGNOSIS — J309 Allergic rhinitis, unspecified: Secondary | ICD-10-CM

## 2015-01-21 DIAGNOSIS — G4762 Sleep related leg cramps: Secondary | ICD-10-CM

## 2015-01-21 DIAGNOSIS — E785 Hyperlipidemia, unspecified: Secondary | ICD-10-CM

## 2015-01-21 DIAGNOSIS — F32A Depression, unspecified: Secondary | ICD-10-CM

## 2015-01-21 DIAGNOSIS — F411 Generalized anxiety disorder: Secondary | ICD-10-CM

## 2015-01-21 LAB — HEMOGLOBIN A1C
Hgb A1c MFr Bld: 5.5 % (ref <5.7)
Mean Plasma Glucose: 111 mg/dL (ref <117)

## 2015-01-21 MED ORDER — SIMVASTATIN 20 MG PO TABS: 20.0000 mg | ORAL_TABLET | Freq: Every day | ORAL | Status: DC

## 2015-01-21 MED ORDER — CLONAZEPAM 1 MG PO TABS: 1.0000 mg | ORAL_TABLET | Freq: Two times a day (BID) | ORAL | Status: DC | PRN

## 2015-01-21 NOTE — Progress Notes (Signed)
   Subjective:    Patient ID: Paula Bradley, female    DOB: 07-Feb-1961, 54 y.o.   MRN: [id number]  HPI  Patient in today to follow-up on obesity, hyperlipidemia, anxiety depression, glucose intolerance. Patient doing well with Klonopin and antidepressant. Feels that she has less anxiety about son who is away in college. Although he did not have a good semester, she has  decided to make him responsible for himself. Less feelings about empty nest syndrome. Staying busy doing other things. Is tolerating statin medication without side effects. She was having some leg cramps and called natural alternatives to recommended magnesium for her. I think this is acceptable. Somehow now thinks Qvar may be contributing to leg cramps but she will discuss that with allergist. I think magnesium will help if it's just nocturnal leg cramps. Her cholesterol is markedly improved. Triglycerides of gone up just a bit. Unfortunately hemoglobin A1C was not added to initial blood draw but will be added today. She initially started on Nutrisystem but has gotten off of that diet. Apparently the food did not appeal to her. She's now preparing her own food according to blood type O diet and feels better. Says husband did lose 20 pounds with Nutrisystem. Patient is lost 10 pounds since last visit which is very good. She wants to no she will ever be able to come off statin medication. With her family history, it may not be advisable. Told her we would reconsider she lost 25 pounds.    Review of Systems     Objective:   Physical Exam  Neck supple without JVD thyromegaly carotid bruits. Chest clear. Cardiac exam regular rate and rhythm. Extremities without edema. Skin warm and dry      Assessment & Plan:  Hyperlipidemia-improved on statin therapy-see results in Epic. These were reviewed with patient today. Continue same dose statin medication.  Plan: Continue same medications. Book physical examination in 6  months.  Anxiety depression-refill Klonopin for 6 months. Continue SSRI  Obesity-has lost 10 pounds  Glucose tolerance-hemoglobin A1c pending. Should respond favorably to weight loss. Refill clonazepam and statin medication at her request.  25 minutes spent with patient answering questions and coordination of care

## 2015-01-22 ENCOUNTER — Telehealth: Payer: Self-pay | Admitting: *Deleted

## 2015-01-22 NOTE — Telephone Encounter (Signed)
Left message for patient regarding HgbA1C results

## 2015-04-14 ENCOUNTER — Other Ambulatory Visit: Payer: Self-pay | Admitting: Internal Medicine

## 2015-04-25 ENCOUNTER — Encounter: Payer: Self-pay | Admitting: Gynecology

## 2015-05-13 ENCOUNTER — Encounter: Payer: Self-pay | Admitting: Internal Medicine

## 2015-06-12 ENCOUNTER — Encounter: Payer: Self-pay | Admitting: Gynecology

## 2015-06-12 ENCOUNTER — Ambulatory Visit (INDEPENDENT_AMBULATORY_CARE_PROVIDER_SITE_OTHER): Payer: 59 | Admitting: Gynecology

## 2015-06-12 VITALS — BP 126/80 | Ht 65.0 in | Wt 192.0 lb

## 2015-06-12 DIAGNOSIS — Z01419 Encounter for gynecological examination (general) (routine) without abnormal findings: Secondary | ICD-10-CM | POA: Diagnosis not present

## 2015-06-12 NOTE — Patient Instructions (Signed)
You may obtain a copy of any labs that were done today by logging onto MyChart as outlined in the instructions provided with your AVS (after visit summary). The office will not call with normal lab results but certainly if there are any significant abnormalities then we will contact you.   Health Maintenance, Female A healthy lifestyle and preventative care can promote health and wellness.  Maintain regular health, dental, and eye exams.  Eat a healthy diet. Foods like vegetables, fruits, whole grains, low-fat dairy products, and lean protein foods contain the nutrients you need without too many calories. Decrease your intake of foods high in solid fats, added sugars, and salt. Get information about a proper diet from your caregiver, if necessary.  Regular physical exercise is one of the most important things you can do for your health. Most adults should get at least 150 minutes of moderate-intensity exercise (any activity that increases your heart rate and causes you to sweat) each week. In addition, most adults need muscle-strengthening exercises on 2 or more days a week.   Maintain a healthy weight. The body mass index (BMI) is a screening tool to identify possible weight problems. It provides an estimate of body fat based on height and weight. Your caregiver can help determine your BMI, and can help you achieve or maintain a healthy weight. For adults 20 years and older:  A BMI below 18.5 is considered underweight.  A BMI of 18.5 to 24.9 is normal.  A BMI of 25 to 29.9 is considered overweight.  A BMI of 30 and above is considered obese.  Maintain normal blood lipids and cholesterol by exercising and minimizing your intake of saturated fat. Eat a balanced diet with plenty of fruits and vegetables. Blood tests for lipids and cholesterol should begin at age 61 and be repeated every 5 years. If your lipid or cholesterol levels are high, you are over 50, or you are a high risk for heart  disease, you may need your cholesterol levels checked more frequently.Ongoing high lipid and cholesterol levels should be treated with medicines if diet and exercise are not effective.  If you smoke, find out from your caregiver how to quit. If you do not use tobacco, do not start.  Lung cancer screening is recommended for adults aged 33 80 years who are at high risk for developing lung cancer because of a history of smoking. Yearly low-dose computed tomography (CT) is recommended for people who have at least a 30-pack-year history of smoking and are a current smoker or have quit within the past 15 years. A pack year of smoking is smoking an average of 1 pack of cigarettes a day for 1 year (for example: 1 pack a day for 30 years or 2 packs a day for 15 years). Yearly screening should continue until the smoker has stopped smoking for at least 15 years. Yearly screening should also be stopped for people who develop a health problem that would prevent them from having lung cancer treatment.  If you are pregnant, do not drink alcohol. If you are breastfeeding, be very cautious about drinking alcohol. If you are not pregnant and choose to drink alcohol, do not exceed 1 drink per day. One drink is considered to be 12 ounces (355 mL) of beer, 5 ounces (148 mL) of wine, or 1.5 ounces (44 mL) of liquor.  Avoid use of street drugs. Do not share needles with anyone. Ask for help if you need support or instructions about stopping  the use of drugs.  High blood pressure causes heart disease and increases the risk of stroke. Blood pressure should be checked at least every 1 to 2 years. Ongoing high blood pressure should be treated with medicines, if weight loss and exercise are not effective.  If you are 59 to 56 years old, ask your caregiver if you should take aspirin to prevent strokes.  Diabetes screening involves taking a blood sample to check your fasting blood sugar level. This should be done once every 3  years, after age 91, if you are within normal weight and without risk factors for diabetes. Testing should be considered at a younger age or be carried out more frequently if you are overweight and have at least 1 risk factor for diabetes.  Breast cancer screening is essential preventative care for women. You should practice "breast self-awareness." This means understanding the normal appearance and feel of your breasts and may include breast self-examination. Any changes detected, no matter how small, should be reported to a caregiver. Women in their 66s and 30s should have a clinical breast exam (CBE) by a caregiver as part of a regular health exam every 1 to 3 years. After age 101, women should have a CBE every year. Starting at age 100, women should consider having a mammogram (breast X-ray) every year. Women who have a family history of breast cancer should talk to their caregiver about genetic screening. Women at a high risk of breast cancer should talk to their caregiver about having an MRI and a mammogram every year.  Breast cancer gene (BRCA)-related cancer risk assessment is recommended for women who have family members with BRCA-related cancers. BRCA-related cancers include breast, ovarian, tubal, and peritoneal cancers. Having family members with these cancers may be associated with an increased risk for harmful changes (mutations) in the breast cancer genes BRCA1 and BRCA2. Results of the assessment will determine the need for genetic counseling and BRCA1 and BRCA2 testing.  The Pap test is a screening test for cervical cancer. Women should have a Pap test starting at age 57. Between ages 25 and 35, Pap tests should be repeated every 2 years. Beginning at age 37, you should have a Pap test every 3 years as long as the past 3 Pap tests have been normal. If you had a hysterectomy for a problem that was not cancer or a condition that could lead to cancer, then you no longer need Pap tests. If you are  between ages 50 and 76, and you have had normal Pap tests going back 10 years, you no longer need Pap tests. If you have had past treatment for cervical cancer or a condition that could lead to cancer, you need Pap tests and screening for cancer for at least 20 years after your treatment. If Pap tests have been discontinued, risk factors (such as a new sexual partner) need to be reassessed to determine if screening should be resumed. Some women have medical problems that increase the chance of getting cervical cancer. In these cases, your caregiver may recommend more frequent screening and Pap tests.  The human papillomavirus (HPV) test is an additional test that may be used for cervical cancer screening. The HPV test looks for the virus that can cause the cell changes on the cervix. The cells collected during the Pap test can be tested for HPV. The HPV test could be used to screen women aged 44 years and older, and should be used in women of any age  who have unclear Pap test results. After the age of 55, women should have HPV testing at the same frequency as a Pap test.  Colorectal cancer can be detected and often prevented. Most routine colorectal cancer screening begins at the age of 44 and continues through age 20. However, your caregiver may recommend screening at an earlier age if you have risk factors for colon cancer. On a yearly basis, your caregiver may provide home test kits to check for hidden blood in the stool. Use of a small camera at the end of a tube, to directly examine the colon (sigmoidoscopy or colonoscopy), can detect the earliest forms of colorectal cancer. Talk to your caregiver about this at age 86, when routine screening begins. Direct examination of the colon should be repeated every 5 to 10 years through age 13, unless early forms of pre-cancerous polyps or small growths are found.  Hepatitis C blood testing is recommended for all people born from [birth year] through 1967 and any  individual with known risks for hepatitis C.  Practice safe sex. Use condoms and avoid high-risk sexual practices to reduce the spread of sexually transmitted infections (STIs). Sexually active women aged 36 and younger should be checked for Chlamydia, which is a common sexually transmitted infection. Older women with new or multiple partners should also be tested for Chlamydia. Testing for other STIs is recommended if you are sexually active and at increased risk.  Osteoporosis is a disease in which the bones lose minerals and strength with aging. This can result in serious bone fractures. The risk of osteoporosis can be identified using a bone density scan. Women ages 20 and over and women at risk for fractures or osteoporosis should discuss screening with their caregivers. Ask your caregiver whether you should be taking a calcium supplement or vitamin D to reduce the rate of osteoporosis.  Menopause can be associated with physical symptoms and risks. Hormone replacement therapy is available to decrease symptoms and risks. You should talk to your caregiver about whether hormone replacement therapy is right for you.  Use sunscreen. Apply sunscreen liberally and repeatedly throughout the day. You should seek shade when your shadow is shorter than you. Protect yourself by wearing long sleeves, pants, a wide-brimmed hat, and sunglasses year round, whenever you are outdoors.  Notify your caregiver of new moles or changes in moles, especially if there is a change in shape or color. Also notify your caregiver if a mole is larger than the size of a pencil eraser.  Stay current with your immunizations. Document Released: 06/28/2011 Document Revised: 04/09/2013 Document Reviewed: 06/28/2011 [facility] Patient Information 2014 [employer].

## 2015-06-12 NOTE — Progress Notes (Signed)
Paula Bradley 11-24-1961 [id number]        54 y.o.  [id number] for annual exam.  Doing well without complaints.  Past medical history,surgical history, problem list, medications, allergies, family history and social history were all reviewed and documented as reviewed in the EPIC chart.  ROS:  Performed with pertinent positives and negatives included in the history, assessment and plan.   Additional significant findings :  none   Exam: [facility] person Vitals:   06/12/15 1044  BP: 126/80  Height: 5\' 5"  (1.651 m)  Weight: 192 lb (87.091 kg)   General appearance:  Normal affect, orientation and appearance. Skin: Grossly normal HEENT: Without gross lesions.  No cervical or supraclavicular adenopathy. Thyroid normal.  Lungs:  Clear without wheezing, rales or rhonchi Cardiac: RR, without RMG Abdominal:  Soft, nontender, without masses, guarding, rebound, organomegaly or hernia Breasts:  Examined lying and sitting without masses, retractions, discharge or axillary adenopathy. Pelvic:  Ext/BUS/vagina normal with atrophic changes  Cervix normal with atrophic changes  Uterus difficult to palpate but overall normal size midline mobile nontender.  Adnexa  Without gross masses or tenderness    Anus and perineum  Normal   Rectovaginal  Normal sphincter tone without palpated masses or tenderness.    Assessment/Plan:  54 y.o. [id number] female for annual exam.   1. Postmenopausal/atrophic genital changes. Patient without significant symptoms of hot flushes, night sweats, vaginal dryness or any vaginal bleeding. Continue to monitor and report any vaginal bleeding. 2. Pap smear/HPV negative 2015. No Pap smear done today. No history of significant abnormal Pap smears. Plan repeat Pap smear at 3-5 year interval per current screening guidelines. 3. Mammography 03/2015. Continue with annual mammography. SBE monthly reviewed. 4. Colonoscopy 2011. Repeat at their recommended interval. 5. DEXA 2012  normal. Plan repeat at age 73. Increase calcium vitamin D reviewed. 6. Health maintenance. No routine lab work done as this is done at Dr. Beryle Quant office. Follow up in one year, sooner as needed.     [name] MD, 11:05 AM 06/12/2015

## 2015-07-15 ENCOUNTER — Other Ambulatory Visit: Payer: 59 | Admitting: Internal Medicine

## 2015-07-17 ENCOUNTER — Encounter: Payer: 59 | Admitting: Internal Medicine

## 2015-08-04 ENCOUNTER — Other Ambulatory Visit: Payer: Self-pay | Admitting: Internal Medicine

## 2015-08-04 ENCOUNTER — Other Ambulatory Visit: Payer: 59 | Admitting: Internal Medicine

## 2015-08-04 DIAGNOSIS — Z1321 Encounter for screening for nutritional disorder: Secondary | ICD-10-CM

## 2015-08-04 DIAGNOSIS — Z0000 Encounter for general adult medical examination without abnormal findings: Secondary | ICD-10-CM

## 2015-08-04 DIAGNOSIS — Z13220 Encounter for screening for lipoid disorders: Secondary | ICD-10-CM

## 2015-08-04 DIAGNOSIS — Z130 Encounter for screening for diseases of the blood and blood-forming organs and certain disorders involving the immune mechanism: Secondary | ICD-10-CM

## 2015-08-04 DIAGNOSIS — Z1329 Encounter for screening for other suspected endocrine disorder: Secondary | ICD-10-CM

## 2015-08-04 LAB — CBC WITH DIFFERENTIAL/PLATELET
Basophils Absolute: 0 10*3/uL (ref 0.0–0.1)
Basophils Relative: 0 % (ref 0–1)
Eosinophils Absolute: 0.1 10*3/uL (ref 0.0–0.7)
Eosinophils Relative: 1 % (ref 0–5)
HCT: 41 % (ref 36.0–46.0)
Hemoglobin: 13.9 g/dL (ref 12.0–15.0)
Lymphocytes Relative: 17 % (ref 12–46)
Lymphs Abs: 1.3 10*3/uL (ref 0.7–4.0)
MCH: 30.9 pg (ref 26.0–34.0)
MCHC: 33.9 g/dL (ref 30.0–36.0)
MCV: 91.1 fL (ref 78.0–100.0)
MPV: 9.8 fL (ref 8.6–12.4)
Monocytes Absolute: 0.7 10*3/uL (ref 0.1–1.0)
Monocytes Relative: 9 % (ref 3–12)
Neutro Abs: 5.6 10*3/uL (ref 1.7–7.7)
Neutrophils Relative %: 73 % (ref 43–77)
Platelets: 253 10*3/uL (ref 150–400)
RBC: 4.5 MIL/uL (ref 3.87–5.11)
RDW: 12.6 % (ref 11.5–15.5)
WBC: 7.7 10*3/uL (ref 4.0–10.5)

## 2015-08-04 LAB — COMPLETE METABOLIC PANEL WITH GFR
ALT: 35 U/L — ABNORMAL HIGH (ref 6–29)
AST: 23 U/L (ref 10–35)
Albumin: 4.1 g/dL (ref 3.6–5.1)
Alkaline Phosphatase: 74 U/L (ref 33–130)
BUN: 16 mg/dL (ref 7–25)
CO2: 25 mmol/L (ref 20–31)
Calcium: 9.1 mg/dL (ref 8.6–10.4)
Chloride: 101 mmol/L (ref 98–110)
Creat: 0.65 mg/dL (ref 0.50–1.05)
GFR, Est African American: >89 mL/min (ref >=60)
GFR, Est Non African American: >89 mL/min (ref >=60)
Glucose, Bld: 104 mg/dL — ABNORMAL HIGH (ref 65–99)
Potassium: 4.2 mmol/L (ref 3.5–5.3)
Sodium: 140 mmol/L (ref 135–146)
Total Bilirubin: 0.6 mg/dL (ref 0.2–1.2)
Total Protein: 7.3 g/dL (ref 6.1–8.1)

## 2015-08-04 LAB — LIPID PANEL
Cholesterol: 215 mg/dL — ABNORMAL HIGH (ref 125–200)
HDL: 85 mg/dL (ref >=46)
LDL Cholesterol: 103 mg/dL (ref <130)
Total CHOL/HDL Ratio: 2.5 Ratio (ref <=5.0)
Triglycerides: 133 mg/dL (ref <150)
VLDL: 27 mg/dL (ref <30)

## 2015-08-04 LAB — TSH: TSH: 2.478 u[IU]/mL (ref 0.350–4.500)

## 2015-08-05 ENCOUNTER — Encounter: Payer: Self-pay | Admitting: Internal Medicine

## 2015-08-05 ENCOUNTER — Ambulatory Visit (INDEPENDENT_AMBULATORY_CARE_PROVIDER_SITE_OTHER): Payer: 59 | Admitting: Internal Medicine

## 2015-08-05 VITALS — BP 126/80 | HR 70 | Temp 97.9°F | Ht 65.0 in | Wt 192.5 lb

## 2015-08-05 DIAGNOSIS — R7302 Impaired glucose tolerance (oral): Secondary | ICD-10-CM

## 2015-08-05 DIAGNOSIS — E669 Obesity, unspecified: Secondary | ICD-10-CM

## 2015-08-05 DIAGNOSIS — J309 Allergic rhinitis, unspecified: Secondary | ICD-10-CM | POA: Diagnosis not present

## 2015-08-05 DIAGNOSIS — F411 Generalized anxiety disorder: Secondary | ICD-10-CM | POA: Diagnosis not present

## 2015-08-05 DIAGNOSIS — Z8709 Personal history of other diseases of the respiratory system: Secondary | ICD-10-CM

## 2015-08-05 DIAGNOSIS — E785 Hyperlipidemia, unspecified: Secondary | ICD-10-CM

## 2015-08-05 DIAGNOSIS — Z0000 Encounter for general adult medical examination without abnormal findings: Secondary | ICD-10-CM | POA: Diagnosis not present

## 2015-08-05 LAB — POCT URINALYSIS DIPSTICK
Bilirubin, UA: NEGATIVE
Blood, UA: NEGATIVE
Glucose, UA: NEGATIVE
Ketones, UA: NEGATIVE
Leukocytes, UA: NEGATIVE
Nitrite, UA: NEGATIVE
Protein, UA: NEGATIVE
Spec Grav, UA: 1.015
Urobilinogen, UA: NEGATIVE
pH, UA: 6

## 2015-08-05 LAB — VITAMIN D 25 HYDROXY (VIT D DEFICIENCY, FRACTURES): Vit D, 25-Hydroxy: 32 ng/mL (ref 30–100)

## 2015-08-05 LAB — HEMOGLOBIN A1C
Hgb A1c MFr Bld: 5.8 % — ABNORMAL HIGH (ref <5.7)
Mean Plasma Glucose: 120 mg/dL — ABNORMAL HIGH (ref <117)

## 2015-08-05 NOTE — Progress Notes (Signed)
Subjective:    Patient ID: Paula Bradley, female    DOB: 09-19-1961, 54 y.o.   MRN: [id number]  HPI 54 year old White Female for health maintenance and evaluation of medical issues. Patient has a history of hyperlipidemia, obesity, asthma, anxiety. She does not smoke. Long-standing history of anxiety which she says she inherited from her father. At one point she was drinking wine to control anxiety.  Tonsillectomy 1985, C-section 1996. Fractured left hand in the remote past. History of allergic rhinitis. History of E[place] and replacement. Is taking Prozac for a number of years. Took Wellbutrin for while.  Had colonoscopy 09/07/2010. Pap smear by GYN. Mammogram up-to-date. Tetanus immunization 2013.  Patient says she is allergic to Advair that it causes hives. She's had a total of 4 pregnancies and 2 miscarriages.  Patient sees Dr. Carmell Austria, psychiatrist. Dr. Elmer Picker is her ophthalmologist. Dr. Terri Piedra is her dermatologist.  History of cerumen impaction treated by Dr. Haroldine Laws.  Social history: She is married. Husband owns and operates [employer]. They live in [place]. She has a daughter who resides in [place] who has to have some financial support. Son attending [employer]. Patient says she's had anxiety depression since she was pregnant with her second child.  Family history: Both parents with history of strokes. Patient's sister lost a child in a drowning accident and subsequently had a nervous breakdown. Father with history of anxiety, hypertension. Mother with history of stroke, diabetes, MI, hypertension and goiter. Sister with history of hypertension. Brother with history of hypertension. Brother has had an MI.    Review of Systems  Constitutional: Positive for fatigue.  HENT: Negative.   Eyes: Negative.   Respiratory: Negative.   Cardiovascular: Negative.   Gastrointestinal: Negative.   Genitourinary: Negative.     Allergic/Immunologic: Positive for environmental allergies.  Neurological: Negative.   Hematological: Negative.   Psychiatric/Behavioral:       Anxiety       Objective:   Physical Exam  Constitutional: She is oriented to person, place, and time. She appears well-developed and well-nourished. No distress.  HENT:  Head: Normocephalic and atraumatic.  Right Ear: External ear normal.  Left Ear: External ear normal.  Mouth/Throat: Oropharynx is clear and moist. No oropharyngeal exudate.  Eyes: Conjunctivae and EOM are normal. Pupils are equal, round, and reactive to light. Right eye exhibits no discharge. Left eye exhibits no discharge. No scleral icterus.  Neck: Neck supple. No JVD present. No thyromegaly present.  Cardiovascular: Normal rate, regular rhythm, normal heart sounds and intact distal pulses.   No murmur heard. Pulmonary/Chest: Effort normal and breath sounds normal. No respiratory distress. She has no wheezes. She has no rales.  Abdominal: Soft. Bowel sounds are normal. She exhibits no distension and no mass. There is no tenderness. There is no rebound and no guarding.  Genitourinary:  Deferred to Dr. Audie Box  Musculoskeletal: She exhibits no edema.  Lymphadenopathy:    She has no cervical adenopathy.  Neurological: She is alert and oriented to person, place, and time. She has normal reflexes. No cranial nerve deficit. Coordination normal.  Skin: Skin is warm and dry. No rash noted. She is not diaphoretic. No erythema.  Psychiatric: She has a normal mood and affect. Her behavior is normal. Judgment and thought content normal.  Vitals reviewed.         Assessment & Plan:  Impaired glucose tolerance  Hyperlipidemia  Obesity  Metabolic syndrome  Allergic rhinitis  History of asthma  Plan: Patient needs dietary counseling. She does not want to develop diabetes. Needs to be motivated to diet exercise and lose weight. Always seems to be enmeshed in her  children's issues. Refer to dietitian. Return in 6 months.

## 2015-08-06 ENCOUNTER — Telehealth: Payer: Self-pay | Admitting: *Deleted

## 2015-08-06 NOTE — Telephone Encounter (Signed)
Reviewed lab results with patient. Referral to Diabetic Education center discussed. Patient is willing to go for education.

## 2015-08-06 NOTE — Telephone Encounter (Signed)
Left message for patient to call back to review lab results.

## 2015-08-11 ENCOUNTER — Other Ambulatory Visit: Payer: Self-pay | Admitting: Internal Medicine

## 2015-08-25 ENCOUNTER — Telehealth: Payer: Self-pay | Admitting: *Deleted

## 2015-08-25 MED ORDER — CLONAZEPAM 1 MG PO TABS: 1.0000 mg | ORAL_TABLET | Freq: Two times a day (BID) | ORAL | Status: DC | PRN

## 2015-08-25 NOTE — Telephone Encounter (Signed)
Klonopin refilled.

## 2015-08-25 NOTE — Telephone Encounter (Signed)
Patient called states she needs refill on her Klonopin 1 mg she states she is traveling this week and needs it called into [facility] on [address].

## 2015-09-05 ENCOUNTER — Other Ambulatory Visit: Payer: Self-pay | Admitting: Internal Medicine

## 2015-09-23 ENCOUNTER — Encounter: Payer: 59 | Attending: Internal Medicine | Admitting: Dietician

## 2015-09-23 ENCOUNTER — Encounter: Payer: Self-pay | Admitting: Dietician

## 2015-09-23 VITALS — Ht 65.5 in | Wt 190.4 lb

## 2015-09-23 DIAGNOSIS — E669 Obesity, unspecified: Secondary | ICD-10-CM | POA: Diagnosis not present

## 2015-09-23 DIAGNOSIS — Z6831 Body mass index (BMI) 31.0-31.9, adult: Secondary | ICD-10-CM | POA: Diagnosis not present

## 2015-09-23 DIAGNOSIS — Z713 Dietary counseling and surveillance: Secondary | ICD-10-CM | POA: Insufficient documentation

## 2015-09-23 NOTE — Progress Notes (Signed)
  Medical Nutrition Therapy:  Appt start time: 1110 end time:  1215.   Assessment:  Primary concerns today: Paula Bradley is here today since her Hgb A1c went from 5.5% to 5.8% and she had gained 10 lbs. During that time period her kitchen was being remodeled and son came from college for the summer. Also had stopped exercising at that time. Since August has been eating more "clean" (food without preservatives). Cooking more food at home now that remodel is complete. Now back to exercising with her trainer. Does not miss or skip meals. Portion sizes might be large (6 oz). Tried Nutrisystem and didn't like it. Husband is on Nutrisystem and she is preparing her own food. Tends to eat meals quickly.  Lives with her husband and not working. She does the food shopping and meal preparation at home. Eats out 2-3 x week. Has a beach house and goes there about 1 x month.   Has lost 4 lbs since late August on her home scale. Would like to get weight down to 150 lbs.   Preferred Learning Style:   No preference indicated   Learning Readiness:   Ready  MEDICATIONS: none   DIETARY INTAKE:  Usual eating pattern includes 3 meals and 2-3 snacks per day.  Avoided foods include: scallops, corn, cucumbers, milk, yogurt   24-hr recall:  B ( AM): egg white and 4 oz of salmon or 1/2 English muffin with natural peanut butter or plain oatmeal with water and egg white or egg or leftovers with water and black coffee  Snk ( AM): sometimes might have apples with walnuts or hummus and veggies  L ( PM): salad with chicken or sauteed vegetables with meat/fish Snk ( PM): apples with walnuts or hummus and veggies or pumpkin seeds D ( PM): [place] chili, fish, chicken salad or usually meat with vegetable and rice  Snk ( PM): chocolate rice cakes or triscuits with cheese  Beverages: water, black, unsweet tea, 1-2 glass of wine each day  Usual physical activity: works out with trainer 2 x week cardio/weights for 60 minutes and  walks 2-3 x week for for 45 minutes   Estimated energy needs: 1600 calories 180 g carbohydrates 120 g protein 44 g fat  Progress Towards Goal(s):  In progress.   Nutritional Diagnosis:  [place]-3.3 Overweight/obesity As related to hx of large portion sizes and frequent restaurant meals.  As evidenced by BMI of 31.2 and Hgb A1c of 5.8%.    Intervention:  Nutrition counseling provided. Plan: Aim to have between 30-45 grams of carbohydrate at meals (quarter of your plate). Have up to 15 gram of carbohydrates at snacks with protein if you are hungry. Continue level of exercise/activity (150-300 minutes per week). Aim to fill half of your plate with vegetables at lunch and dinner. Plan to a splurge meal each week.  Try using smaller plates to help with portion size. Try taking 20 minutes to eat meals (put your fork down between bites, pace yourself with slower eaters, chew food well, eat without distractions).  Teaching Method Utilized:  Visual Auditory Hands on  Handouts given during visit include:  Living Well With Diabetes  Meal Plan  15 g Snacks  Blood glucose monitoring  Barriers to learning/adherence to lifestyle change: none  Demonstrated degree of understanding via:  Teach Back   Monitoring/Evaluation:  Dietary intake, exercise, and body weight prn.

## 2015-09-23 NOTE — Patient Instructions (Signed)
Aim to have between 30-45 grams of carbohydrate at meals (quarter of your plate). Have up to 15 gram of carbohydrates at snacks with protein if you are hungry. Continue level of exercise/activity (150-300 minutes per week). Aim to fill half of your plate with vegetables at lunch and dinner. Plan to a splurge meal each week.  Try using smaller plates to help with portion size. Try taking 20 minutes to eat meals (put your fork down between bites, pace yourself with slower eaters, chew food well, eat without distractions).

## 2015-09-25 ENCOUNTER — Other Ambulatory Visit: Payer: Self-pay | Admitting: *Deleted

## 2015-09-25 ENCOUNTER — Other Ambulatory Visit: Payer: Self-pay | Admitting: Allergy and Immunology

## 2015-09-25 MED ORDER — PANTOPRAZOLE SODIUM 40 MG PO TBEC: 40.0000 mg | DELAYED_RELEASE_TABLET | Freq: Every day | ORAL | Status: DC

## 2015-09-25 NOTE — Patient Instructions (Addendum)
Please seek dietary counseling. Watch diet. Exercise. Return in 6 months. Has had mammogram. Had colonoscopy in 2011.

## 2015-10-15 ENCOUNTER — Ambulatory Visit: Payer: 59 | Admitting: Allergy and Immunology

## 2015-11-12 ENCOUNTER — Encounter: Payer: Self-pay | Admitting: Allergy and Immunology

## 2015-11-12 ENCOUNTER — Ambulatory Visit (INDEPENDENT_AMBULATORY_CARE_PROVIDER_SITE_OTHER): Payer: 59 | Admitting: Allergy and Immunology

## 2015-11-12 VITALS — BP 120/78 | HR 64 | Temp 98.3°F | Resp 12

## 2015-11-12 DIAGNOSIS — K219 Gastro-esophageal reflux disease without esophagitis: Secondary | ICD-10-CM

## 2015-11-12 DIAGNOSIS — J387 Other diseases of larynx: Secondary | ICD-10-CM

## 2015-11-12 DIAGNOSIS — J3089 Other allergic rhinitis: Secondary | ICD-10-CM

## 2015-11-12 DIAGNOSIS — J4540 Moderate persistent asthma, uncomplicated: Secondary | ICD-10-CM | POA: Diagnosis not present

## 2015-11-12 NOTE — Patient Instructions (Signed)
  1. Continue Qvar 80 two inhalations two times per day. Can attempt to taper down to one time per day if doing well.  2. Continue Protonix 40 in AM + Ranitidine 300 in PM. Can attempt to discontinue Protonix if doing well  3. Continue montelukast 10 mg one tablet one time per day  4. Continue ProAir HFA and Zyrtec if needed.  5. Return in 6 months

## 2015-11-12 NOTE — Progress Notes (Signed)
[facility] [place]  Follow-up Note  Refering Provider: Margaree MackintoshBaxley, Mary J, MD Primary Provider: Margaree MackintoshBAXLEY,MARY J, MD  Subjective:   Paula Bradley is a 54 y.o. female who returns [facility] the [facility] in re-evaluation of the following:  HPI Comments: Paula BurlyRhea returns to this clinic in reevaluation of her asthma and allergic rhinoconjunctivitis and reflux-induced respiratory disease. Overall she is done quite well and there has not been any specific problem with any of these disease states. Specifically, she does not use a short acting bronchodilator can exercise without any problem. Her reflux is doing quite well with a combination of peptic Dniyah Bradley in the morning and ranitidine in the evening and attention to her wine drinking. She's had no problems with her nose while using Zyrtec. She refuses to get the flu vaccine.   Outpatient Encounter Prescriptions as of 11/12/2015  Medication Sig  . Calcium-Magnesium-Vitamin D (CALCIUM 500 PO) Take by mouth.  . Cholecalciferol (VITAMIN D) 2000 UNITS tablet Take 2,000 Units by mouth daily.    . clonazePAM (KLONOPIN) 1 MG tablet Take 1 tablet (1 mg total) by mouth 2 (two) times daily as needed for anxiety.  Marland Kitchen. co-enzyme Q-10 30 MG capsule Take 30 mg by mouth 3 (three) times daily.  Marland Kitchen. FLUoxetine (PROZAC) 20 MG tablet TAKE 1 TABLET BY MOUTH DAILY  . GLUCOSA-CHONDR-NA CHONDR-MSM PO Take by mouth.  Marland Kitchen. MAGNESIUM CARBONATE PO Take by mouth.  . Misc Natural Products (TART CHERRY ADVANCED PO) Take by mouth.  . montelukast (SINGULAIR) 10 MG tablet   . Multiple Vitamin (MULTIVITAMIN) tablet Take 1 tablet by mouth daily.  . Omega-3 Fatty Acids (FISH OIL PO) Take by mouth.  . pantoprazole (PROTONIX) 40 MG tablet TAKE 1 TABLET BY MOUTH EVERY DAY FOR REFLUX  . QVAR 80 MCG/ACT inhaler Inhale 2 puffs into the lungs daily.   Marland Kitchen. RANITIDINE HCL PO Take 300 mg by mouth at bedtime.   . simvastatin (ZOCOR) 20 MG  tablet TAKE 1 TABLET BY MOUTH EVERY NIGHT AT BEDTIME  . TURMERIC PO Take by mouth.  . [DISCONTINUED] pantoprazole (PROTONIX) 40 MG tablet Take 1 tablet (40 mg total) by mouth daily.  . [DISCONTINUED] Pantoprazole Sodium (PROTONIX PO) Take by mouth.  . [DISCONTINUED] Beclomethasone Dipropionate (QVAR IN) Inhale into the lungs.   No facility-administered encounter medications on file as of 11/12/2015.    No orders of the defined types were placed in this encounter.    Past Medical History  Diagnosis Date  . Allergic rhinitis   . Asthma   . Anxiety and depression   . Menopause   . Hyperlipidemia   . Depression   . Anxiety     Past Surgical History  Procedure Laterality Date  . Tonsillectomy  1985  . Cesarean section  1996  . Colposcopy    . Dilation and curettage of uterus      Allergies  Allergen Reactions  . Fluticasone-Salmeterol     REACTION: hives  . Scallops [Shellfish Allergy]     Review of Systems  Constitutional: Negative.   HENT: Negative.   Eyes: Negative.   Respiratory: Negative.   Cardiovascular: Negative.   Gastrointestinal: Negative.   Skin: Negative.      Objective:   Filed Vitals:   11/12/15 1017  BP: 120/78  Pulse: 64  Temp: 98.3 F (36.8 C)  Resp: 12          Physical Exam  Constitutional: She appears  well-developed and well-nourished. No distress.  HENT:  Head: Normocephalic and atraumatic. Head is without right periorbital erythema and without left periorbital erythema.  Right Ear: Tympanic membrane, external ear and ear canal normal. No drainage or tenderness. No foreign bodies. Tympanic membrane is not injected, not scarred, not perforated, not erythematous, not retracted and not bulging. No middle ear effusion.  Left Ear: Tympanic membrane, external ear and ear canal normal. No drainage or tenderness. No foreign bodies. Tympanic membrane is not injected, not scarred, not perforated, not erythematous, not retracted and not  bulging.  No middle ear effusion.  Nose: Nose normal. No mucosal edema, rhinorrhea, nose lacerations or sinus tenderness.  No foreign bodies.  Mouth/Throat: Oropharynx is clear and moist. No oropharyngeal exudate, posterior oropharyngeal edema, posterior oropharyngeal erythema or tonsillar abscesses.  Eyes: Lids are normal. Right eye exhibits no chemosis, no discharge and no exudate. No foreign body present in the right eye. Left eye exhibits no chemosis, no discharge and no exudate. No foreign body present in the left eye. Right conjunctiva is not injected. Left conjunctiva is not injected.  Neck: Neck supple. No tracheal tenderness present. No tracheal deviation and no edema present. No thyroid mass and no thyromegaly present.  Cardiovascular: Normal rate, regular rhythm, S1 normal and S2 normal.  Exam reveals no gallop.   No murmur heard. Pulmonary/Chest: No accessory muscle usage or stridor. No respiratory distress. She has no wheezes. She has no rhonchi. She has no rales.  Abdominal: Soft.  Lymphadenopathy:       Head (right side): No tonsillar adenopathy present.       Head (left side): No tonsillar adenopathy present.    She has no cervical adenopathy.  Neurological: She is alert.  Skin: No rash noted. She is not diaphoretic.  Psychiatric: She has a normal mood and affect. Her behavior is normal.    Diagnostics:    Spirometry was performed and demonstrated an FEV1 of 1.9 to at 74 % of predicted.  The patient had an Asthma Control Test with the following results: ACT Total Score: 24.    Assessment and Plan:   1. Other allergic rhinitis   2. Moderate persistent asthma, uncomplicated   3. LPRD (laryngopharyngeal reflux disease)      1. Continue Qvar 80 two inhalations two times per day. Can attempt to taper down to one time per day if doing well.  2. Continue Protonix 40 in AM + Ranitidine 300 in PM. Can attempt to discontinue Protonix if doing well  3. Continue montelukast 10  mg one tablet one time per day  4. Continue ProAir HFA and Zyrtec if needed.  5. Return in 6 months  Niobe has done great and we will see if we can consolidate her medical therapy as noted above. She has the option of using Qvar 80 2 inhalations one time per day and she has the option of attempting to discontinue her Protonix. She will keep in contact with me noting her response to this plan and I will see her back in this clinic in 6 months or earlier if there is a problem.    Laurette Schimke, MD [facility]

## 2015-11-17 ENCOUNTER — Other Ambulatory Visit: Payer: Self-pay | Admitting: Allergy and Immunology

## 2015-12-01 ENCOUNTER — Other Ambulatory Visit: Payer: Self-pay | Admitting: Internal Medicine

## 2016-02-10 ENCOUNTER — Other Ambulatory Visit: Payer: 59 | Admitting: Internal Medicine

## 2016-02-12 ENCOUNTER — Ambulatory Visit: Payer: 59 | Admitting: Internal Medicine

## 2016-03-08 ENCOUNTER — Other Ambulatory Visit: Payer: Self-pay | Admitting: Internal Medicine

## 2016-03-09 ENCOUNTER — Other Ambulatory Visit: Payer: BLUE CROSS/BLUE SHIELD | Admitting: Internal Medicine

## 2016-03-09 ENCOUNTER — Other Ambulatory Visit: Payer: Self-pay | Admitting: Internal Medicine

## 2016-03-09 DIAGNOSIS — E785 Hyperlipidemia, unspecified: Secondary | ICD-10-CM

## 2016-03-09 LAB — HEPATIC FUNCTION PANEL
ALT: 24 U/L (ref 6–29)
AST: 20 U/L (ref 10–35)
Albumin: 4.2 g/dL (ref 3.6–5.1)
Alkaline Phosphatase: 67 U/L (ref 33–130)
Bilirubin, Direct: 0.1 mg/dL (ref <=0.2)
Indirect Bilirubin: 0.4 mg/dL (ref 0.2–1.2)
Total Bilirubin: 0.5 mg/dL (ref 0.2–1.2)
Total Protein: 7.3 g/dL (ref 6.1–8.1)

## 2016-03-09 LAB — LIPID PANEL
Cholesterol: 212 mg/dL — ABNORMAL HIGH (ref 125–200)
HDL: 88 mg/dL (ref >=46)
LDL Cholesterol: 102 mg/dL (ref <130)
Total CHOL/HDL Ratio: 2.4 Ratio (ref <=5.0)
Triglycerides: 110 mg/dL (ref <150)
VLDL: 22 mg/dL (ref <30)

## 2016-03-11 ENCOUNTER — Encounter: Payer: Self-pay | Admitting: Internal Medicine

## 2016-03-11 ENCOUNTER — Ambulatory Visit (INDEPENDENT_AMBULATORY_CARE_PROVIDER_SITE_OTHER): Payer: BLUE CROSS/BLUE SHIELD | Admitting: Internal Medicine

## 2016-03-11 VITALS — BP 116/80 | HR 66 | Temp 97.6°F | Resp 18 | Ht 66.0 in | Wt 186.0 lb

## 2016-03-11 DIAGNOSIS — E669 Obesity, unspecified: Secondary | ICD-10-CM | POA: Diagnosis not present

## 2016-03-11 DIAGNOSIS — E785 Hyperlipidemia, unspecified: Secondary | ICD-10-CM

## 2016-03-11 DIAGNOSIS — F411 Generalized anxiety disorder: Secondary | ICD-10-CM | POA: Diagnosis not present

## 2016-03-11 DIAGNOSIS — K219 Gastro-esophageal reflux disease without esophagitis: Secondary | ICD-10-CM

## 2016-03-11 LAB — HEMOGLOBIN A1C
Hgb A1c MFr Bld: 5.7 % — ABNORMAL HIGH (ref <5.7)
Mean Plasma Glucose: 117 mg/dL — ABNORMAL HIGH (ref <117)

## 2016-03-11 MED ORDER — PANTOPRAZOLE SODIUM 40 MG PO TBEC
DELAYED_RELEASE_TABLET | ORAL | Status: DC
Start: 2016-03-11 — End: 2017-12-01

## 2016-03-11 MED ORDER — SIMVASTATIN 20 MG PO TABS: 20.0000 mg | ORAL_TABLET | Freq: Every day | ORAL | Status: DC

## 2016-03-11 MED ORDER — FLUOXETINE HCL 20 MG PO TABS: 20.0000 mg | ORAL_TABLET | Freq: Every day | ORAL | Status: DC

## 2016-03-11 MED ORDER — CLONAZEPAM 1 MG PO TABS: 1.0000 mg | ORAL_TABLET | Freq: Two times a day (BID) | ORAL | Status: DC | PRN

## 2016-03-11 NOTE — Progress Notes (Signed)
   Subjective:    Patient ID: Paula Bradley, female    DOB: 11-12-1961, [age] y.o.   MRN: [id number]  HPI 55 year old White Female in to follow-up on impaired glucose tolerance and hyperlipidemia as well as anxiety. Her daughter has come home from [place] to work with [employer]band's business, [employer]. 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. Patient is pleased to have her back in [place]. She is living in her own apartment. Son is still in school at [employer]. Patient says that she's been trying to eat properly. His lost 8 pounds since last visit. No troubles with asthma recently. Recent lipid panel showed total cholesterol 212, HDL cholesterol 88, triglycerides 110, LDL cholesterol 102. Hemoglobin A1c pending. Her best lipid panel was January 2016. Since August, she has lost 6.5 pounds. Still drinks wine and probably has too many empty calories with that.    Review of Systems seems less anxious than before     Objective:   Physical Exam  Neck is supple without JVD thyromegaly or carotid bruits. Chest clear to auscultation. Cardiac exam regular rate and rhythm normal S1 and S2. Extremities without edema. Affect is normal.      Assessment & Plan:  Obesity-continue diet and exercise efforts. Watch wine consumption.  Hyperlipidemia-stable on statin  Generalized anxiety disorder-continue SSRI and when necessary Xanax  History of asthma-stable  GERD-continue PPI  Allergic rhinitis  Plan: Hemoglobin A1c pending. Continue diet and exercise efforts and return September 2017 for physical exam. [place]

## 2016-03-11 NOTE — Patient Instructions (Addendum)
Continue diet and exercise. AIC is pending. Continue diet and exercise. RTC in 6 months. September is better for pt for CPE.

## 2016-03-12 ENCOUNTER — Telehealth: Payer: Self-pay | Admitting: Internal Medicine

## 2016-03-12 NOTE — Telephone Encounter (Signed)
Patient called to see if A1C was back.  Advised that it was back and was on the "upper limits of normal" per Dr. Lenord FellersBaxley.  Patient is pleased with her results and will continue to push to continue to lower her results.

## 2016-04-14 ENCOUNTER — Other Ambulatory Visit: Payer: Self-pay | Admitting: Allergy and Immunology

## 2016-05-11 ENCOUNTER — Encounter: Payer: Self-pay | Admitting: Allergy and Immunology

## 2016-05-11 ENCOUNTER — Ambulatory Visit (INDEPENDENT_AMBULATORY_CARE_PROVIDER_SITE_OTHER): Payer: BLUE CROSS/BLUE SHIELD | Admitting: Allergy and Immunology

## 2016-05-11 VITALS — BP 128/80 | HR 64 | Resp 16

## 2016-05-11 DIAGNOSIS — J4541 Moderate persistent asthma with (acute) exacerbation: Secondary | ICD-10-CM | POA: Diagnosis not present

## 2016-05-11 DIAGNOSIS — J019 Acute sinusitis, unspecified: Secondary | ICD-10-CM | POA: Diagnosis not present

## 2016-05-11 DIAGNOSIS — J387 Other diseases of larynx: Secondary | ICD-10-CM

## 2016-05-11 DIAGNOSIS — J3089 Other allergic rhinitis: Secondary | ICD-10-CM

## 2016-05-11 DIAGNOSIS — K219 Gastro-esophageal reflux disease without esophagitis: Secondary | ICD-10-CM

## 2016-05-11 NOTE — Progress Notes (Signed)
Follow-up Note  Referring Provider: Margaree Mackintosh, MD Primary Provider: Margaree Mackintosh, MD Date of Office Visit: 05/11/2016  Subjective:   Paula Bradley (DOB: June 26, 1961) is a 55 y.o. female who [facility] the [facility] on 05/11/2016 in re-evaluation of the following:  HPI: Akya returns to this clinic in reevaluation of her asthma and allergic rhinoconjunctivitis and LPR. She's been doing quite well since her last visit in this clinic of November 2016 but unfortunately 2 days ago she developed nasal congestion and sore throat and coughing and wheezing and shortness of breath and had to use her short-acting bronchodilator. She self administered 40 mg of prednisone on Sunday and started on Mucinex and Zyrtec. She also increased her Qvar to 3 inhalations 3 times per day. She also reinitiated the use of Protonix added into her ranitidine therapy for her reflux when she became sick. She is slightly better today.   Prior to this point time she did wonderful without any exacerbations of her asthma, no systemic steroid use, no problems with her nose, no problems with her reflux, and no problems with her throat, while consistently using medical therapy which included ranitidine, montelukast, and Qvar.    Medication List           CALCIUM 500 PO  Take by mouth.     clonazePAM 1 MG tablet  Commonly known as:  KLONOPIN  Take 1 tablet (1 mg total) by mouth 2 (two) times daily as needed for anxiety.     co-enzyme Q-10 30 MG capsule  Take 30 mg by mouth 3 (three) times daily.     FISH OIL PO  Take by mouth.     FLUoxetine 20 MG tablet  Commonly known as:  PROZAC  Take 1 tablet (20 mg total) by mouth daily.     GLUCOSA-CHONDR-NA CHONDR-MSM PO  Take by mouth.     MAGNESIUM CARBONATE PO  Take by mouth.     montelukast 10 MG tablet  Commonly known as:  SINGULAIR  TAKE ONE TABLET ONCE DAILY AS DIRECTED     multivitamin tablet  Take 1 tablet by mouth daily.       pantoprazole 40 MG tablet  Commonly known as:  PROTONIX  TAKE 1 TABLET BY MOUTH EVERY DAY FOR REFLUX     QVAR 80 MCG/ACT inhaler  Generic drug:  beclomethasone  Inhale 2 puffs into the lungs daily.     RANITIDINE HCL PO  Take 300 mg by mouth at bedtime.     simvastatin 20 MG tablet  Commonly known as:  ZOCOR  Take 1 tablet (20 mg total) by mouth at bedtime.     TART CHERRY ADVANCED PO  Take by mouth.     TURMERIC PO  Take by mouth.     Vitamin D 2000 units tablet  Take 2,000 Units by mouth daily.        Past Medical History  Diagnosis Date  . Allergic rhinitis   . Asthma   . Anxiety and depression   . Menopause   . Hyperlipidemia   . Depression   . Anxiety     Past Surgical History  Procedure Laterality Date  . Tonsillectomy  1985  . Cesarean section  1996  . Colposcopy    . Dilation and curettage of uterus      Allergies  Allergen Reactions  . Fluticasone-Salmeterol     REACTION: hives  . Scallops [Shellfish Allergy]  Review of systems negative except as noted in HPI / PMHx or noted below:  Review of Systems  Constitutional: Negative.   HENT: Negative.   Eyes: Negative.   Respiratory: Negative.   Cardiovascular: Negative.   Gastrointestinal: Negative.   Genitourinary: Negative.   Musculoskeletal: Negative.   Skin: Negative.   Neurological: Negative.   Endo/Heme/Allergies: Negative.   Psychiatric/Behavioral: Negative.      Objective:   Filed Vitals:   05/11/16 1114  BP: 128/80  Pulse: 64  Resp: 16          Physical Exam  Constitutional: She is well-developed, well-nourished, and in no distress.  Coughing  HENT:  Head: Normocephalic. Head is without right periorbital erythema and without left periorbital erythema.  Right Ear: Tympanic membrane, external ear and ear canal normal.  Left Ear: Tympanic membrane, external ear and ear canal normal.  Nose: Mucosal edema (Erythematous) present. No rhinorrhea.  Mouth/Throat: Uvula  is midline, oropharynx is clear and moist and mucous membranes are normal. No oropharyngeal exudate.  Eyes: Conjunctivae and lids are normal. Pupils are equal, round, and reactive to light.  Neck: Trachea normal. No tracheal tenderness present. No tracheal deviation present. No thyromegaly present.  Cardiovascular: Normal rate, regular rhythm, S1 normal, S2 normal and normal heart sounds.   No murmur heard. Pulmonary/Chest: Effort normal and breath sounds normal. No stridor. No tachypnea. No respiratory distress. She has no wheezes. She has no rales. She exhibits no tenderness.  Abdominal: Soft.  Musculoskeletal: She exhibits no edema or tenderness.  Lymphadenopathy:       Head (right side): No tonsillar adenopathy present.       Head (left side): No tonsillar adenopathy present.    She has no cervical adenopathy.    She has no axillary adenopathy.  Neurological: She is alert. Gait normal.  Skin: No rash noted. She is not diaphoretic. No erythema. No pallor. Nails show no clubbing.  Psychiatric: Mood and affect normal.    Diagnostics:    Spirometry was performed and demonstrated an FEV1 of 1.85 at 69 % of predicted.  The patient had an Asthma Control Test with the following results: ACT Total Score: 23.    Assessment and Plan:   1. Asthma, not well controlled, moderate persistent, with acute exacerbation   2. Other allergic rhinitis   3. LPRD (laryngopharyngeal reflux disease)   4. Acute sinusitis, recurrence not specified, unspecified location     1. Continue Qvar 80 two inhalations two times per day. Can attempt to taper down to one time per day if doing well. Can increase to 3 inhalations 3 times per day as part of action plan for asthma flare  2. Continue  Ranitidine 300 in PM. Can add Protonix 40 mg one time per day if needed   3. Continue montelukast 10 mg one tablet one time per day  4. Continue ProAir HFA and Zyrtec if needed.  5. Can add over-the-counter Mucinex and  nasal saline if needed  6. Prednisone 10 mg one tablet once a day for the next 5 days  7. Further treatment?  8. Return in 6 months or earlier if problem  I suspect that [name] has a viral respiratory tract infection that is given rise to an asthma flare and I will treat her conservatively by administering a very low dose of prednisone for the next 5 days while she continues to use high-dose inhaled steroids and as well add Protonix to her ranitidine to prevent her from developing a  significant secondary problem in the form of LPR. I will not administer any antibiotics at this point in time. She'll keep in contact with me noting her response and if she does well I will see her back in this clinic in 6 months or earlier if there is a problem.  Laurette Schimke, MD [facility]

## 2016-05-11 NOTE — Patient Instructions (Signed)
  1. Continue Qvar 80 two inhalations two times per day. Can attempt to taper down to one time per day if doing well. Can increase to 3 inhalations 3 times per day as part of action plan for asthma flare  2. Continue  Ranitidine 300 in PM. Can add Protonix 40 mg one time per day if needed   3. Continue montelukast 10 mg one tablet one time per day  4. Continue ProAir HFA and Zyrtec if needed.  5. Can add over-the-counter Mucinex and nasal saline if needed  6. Prednisone 10 mg one tablet once a day for the next 5 days  7. Further treatment?  8. Return in 6 months or earlier if problem

## 2016-06-22 ENCOUNTER — Other Ambulatory Visit: Payer: Self-pay | Admitting: Internal Medicine

## 2016-06-22 ENCOUNTER — Other Ambulatory Visit: Payer: Self-pay | Admitting: Allergy and Immunology

## 2016-07-28 ENCOUNTER — Encounter: Payer: BLUE CROSS/BLUE SHIELD | Admitting: Gynecology

## 2016-09-17 ENCOUNTER — Encounter: Payer: Self-pay | Admitting: Gynecology

## 2016-09-17 ENCOUNTER — Ambulatory Visit (INDEPENDENT_AMBULATORY_CARE_PROVIDER_SITE_OTHER): Payer: BLUE CROSS/BLUE SHIELD | Admitting: Gynecology

## 2016-09-17 VITALS — BP 118/70 | Ht 65.5 in | Wt 187.0 lb

## 2016-09-17 DIAGNOSIS — Z01419 Encounter for gynecological examination (general) (routine) without abnormal findings: Secondary | ICD-10-CM | POA: Diagnosis not present

## 2016-09-17 NOTE — Patient Instructions (Signed)

## 2016-09-17 NOTE — Progress Notes (Signed)
    Katrine CohoRhea Horne Laws [birth year] [id number]        55 y.o.  [id number]  for annual exam.  Doing well without complaints  Past medical history,surgical history, problem list, medications, allergies, family history and social history were all reviewed and documented as reviewed in the EPIC chart.  ROS:  Performed with pertinent positives and negatives included in the history, assessment and plan.   Additional significant findings :  None   Exam: Kennon PortelaKim Gardner assistant Vitals:   09/17/16 0920  BP: 118/70  Weight: 187 lb (84.8 kg)  Height: 5' 5.5" (1.664 m)   Body mass index is 30.65 kg/m.  General appearance:  Normal affect, orientation and appearance. Skin: Grossly normal HEENT: Without gross lesions.  No cervical or supraclavicular adenopathy. Thyroid normal.  Lungs:  Clear without wheezing, rales or rhonchi Cardiac: RR, without RMG Abdominal:  Soft, nontender, without masses, guarding, rebound, organomegaly or hernia Breasts:  Examined lying and sitting without masses, retractions, discharge or axillary adenopathy. Pelvic:  Ext, BUS, Vagina normal with mild atrophic changes  Cervix normal  Uterus anteverted grossly normal size, nontender   Adnexa without masses or tenderness    Anus and perineum normal   Rectovaginal normal sphincter tone without palpated masses or tenderness.    Assessment/Plan:  55 y.o. [id number] female for annual exam.   1. Postmenopausal/atrophic genital changes. No significant hot flushes, night sweats, vaginal dryness or any vaginal bleeding. Continue to monitor report any issues or vaginal bleeding. 2. Pap smear/HPV 2015 negative. No Pap smear done today. No history of significant abnormal Pap smears. Plan repeat Pap smear approaching 5 year interval per current screening guidelines. 3. Mammography 04/2016. Continue with annual mammography when due. SBE monthly reviewed. 4. Colonoscopy 2011 with reported repeat interval 10 years. 5. DEXA 2012 normal.  Plan repeat DEXA at age 55. 6. Health maintenance. No routine lab work done as patient reports this done elsewhere. Follow up 1 year, sooner as needed.   Dara LordsFONTAINE,Andria Head P MD, 10:02 AM 09/17/2016

## 2016-09-21 ENCOUNTER — Other Ambulatory Visit: Payer: BLUE CROSS/BLUE SHIELD | Admitting: Internal Medicine

## 2016-09-23 ENCOUNTER — Encounter: Payer: BLUE CROSS/BLUE SHIELD | Admitting: Internal Medicine

## 2016-09-28 ENCOUNTER — Other Ambulatory Visit: Payer: Self-pay | Admitting: Internal Medicine

## 2016-10-11 ENCOUNTER — Other Ambulatory Visit: Payer: Self-pay | Admitting: Internal Medicine

## 2016-10-11 NOTE — Telephone Encounter (Signed)
Refill once 

## 2016-11-11 ENCOUNTER — Other Ambulatory Visit: Payer: BLUE CROSS/BLUE SHIELD | Admitting: Internal Medicine

## 2016-11-11 ENCOUNTER — Ambulatory Visit (INDEPENDENT_AMBULATORY_CARE_PROVIDER_SITE_OTHER): Payer: BLUE CROSS/BLUE SHIELD | Admitting: Allergy & Immunology

## 2016-11-11 ENCOUNTER — Encounter: Payer: Self-pay | Admitting: Allergy & Immunology

## 2016-11-11 VITALS — BP 124/80 | HR 65 | Temp 98.4°F | Resp 18

## 2016-11-11 DIAGNOSIS — J4541 Moderate persistent asthma with (acute) exacerbation: Secondary | ICD-10-CM

## 2016-11-11 DIAGNOSIS — J019 Acute sinusitis, unspecified: Secondary | ICD-10-CM | POA: Diagnosis not present

## 2016-11-11 DIAGNOSIS — E785 Hyperlipidemia, unspecified: Secondary | ICD-10-CM

## 2016-11-11 DIAGNOSIS — R7302 Impaired glucose tolerance (oral): Secondary | ICD-10-CM

## 2016-11-11 DIAGNOSIS — Z0000 Encounter for general adult medical examination without abnormal findings: Secondary | ICD-10-CM

## 2016-11-11 DIAGNOSIS — Z1329 Encounter for screening for other suspected endocrine disorder: Secondary | ICD-10-CM

## 2016-11-11 DIAGNOSIS — Z130 Encounter for screening for diseases of the blood and blood-forming organs and certain disorders involving the immune mechanism: Secondary | ICD-10-CM

## 2016-11-11 LAB — CBC WITH DIFFERENTIAL/PLATELET
Basophils Absolute: 0 cells/uL (ref 0–200)
Basophils Relative: 0 %
Eosinophils Absolute: 102 cells/uL (ref 15–500)
Eosinophils Relative: 2 %
HCT: 40.2 % (ref 35.0–45.0)
Hemoglobin: 13.4 g/dL (ref 11.7–15.5)
Lymphocytes Relative: 24 %
Lymphs Abs: 1224 cells/uL (ref 850–3900)
MCH: 30.9 pg (ref 27.0–33.0)
MCHC: 33.3 g/dL (ref 32.0–36.0)
MCV: 92.6 fL (ref 80.0–100.0)
MPV: 9.9 fL (ref 7.5–12.5)
Monocytes Absolute: 612 cells/uL (ref 200–950)
Monocytes Relative: 12 %
Neutro Abs: 3162 cells/uL (ref 1500–7800)
Neutrophils Relative %: 62 %
Platelets: 229 10*3/uL (ref 140–400)
RBC: 4.34 MIL/uL (ref 3.80–5.10)
RDW: 12.7 % (ref 11.0–15.0)
WBC: 5.1 10*3/uL (ref 3.8–10.8)

## 2016-11-11 LAB — TSH: TSH: 2.04 mIU/L

## 2016-11-11 MED ORDER — BECLOMETHASONE DIPROPIONATE 80 MCG/ACT IN AERS: INHALATION_SPRAY | RESPIRATORY_TRACT | 5 refills | Status: DC

## 2016-11-11 MED ORDER — ALBUTEROL SULFATE HFA 108 (90 BASE) MCG/ACT IN AERS: 4.0000 | INHALATION_SPRAY | RESPIRATORY_TRACT | 1 refills | Status: DC | PRN

## 2016-11-11 MED ORDER — METHYLPREDNISOLONE ACETATE 80 MG/ML IJ SUSP
80.0000 mg | Freq: Once | INTRAMUSCULAR | Status: AC
  Administered 2016-11-11: 80 mg via INTRAMUSCULAR

## 2016-11-11 MED ORDER — MONTELUKAST SODIUM 10 MG PO TABS: 10.0000 mg | ORAL_TABLET | Freq: Every day | ORAL | 5 refills | Status: DC

## 2016-11-11 MED ORDER — HYDROCOD POLST-CPM POLST ER 10-8 MG/5ML PO SUER: 5.0000 mL | Freq: Two times a day (BID) | ORAL | 0 refills | Status: DC | PRN

## 2016-11-11 MED ORDER — AMOXICILLIN-POT CLAVULANATE 875-125 MG PO TABS: 1.0000 | ORAL_TABLET | Freq: Two times a day (BID) | ORAL | 0 refills | Status: DC

## 2016-11-11 NOTE — Progress Notes (Signed)
FOLLOW UP  Date of Service/Encounter:  11/11/16   Assessment:   Acute sinusitis, recurrence not specified  Moderate persistent asthma with acute exacerbation  Asthma Reportables:  Severity: moderate persistent  Risk: low Control: well controlled  Seasonal Influenza Vaccine: yes    Plan/Recommendations:   1. Acute sinusitis - Start Augmentin 875mg  one tablet twice daily for 14 days. - Continue with nasal saline rinses 1-2 times daily. - Take the entire course of antibiotics even if you start to feel better. - Eat extra yogurt to decrease your chances of diarrhea. - We will send in a prescription for cough medicine.   2. Moderate persistent asthma with acute exacerbation - Spirometry findings were not classic for an asthma exacerbation today, moderate restriction. - Bronchodilator challenge did not show any significant improvement, although there was a 15% increase in her FEF 25-75%. - If she continues to have restriction at the next visit, could consider full pulmonary function testing. - We gave you one dose of IM steroid in clinic today. - Start the steroid pack tonight and complete the entire course. - Daily controller medication(s): Qvar 80mcg two puffs in the morning and two puffs at night - Rescue medications: ProAir 4 puffs every 4-6 hours as needed - Changes during respiratory infections or worsening symptoms: increase Qvar 80mcg to 2 puffs three times daily for TWO WEEKS. - Asthma control goals:  * Full participation in all desired activities (may need albuterol before activity) * Albuterol use two time or less a week on average (not counting use with activity) * Cough interfering with sleep two time or less a month * Oral steroids no more than once a year * No hospitalizations  3. Allergic rhinitis - Continue with Singulair 10mg  daily. - Use an antihistamine (Claritin, Zyrtec, Allegra) as needed.  4. Return in about 3 months (around  02/11/2017).    Subjective:   Paula Bradley is a 55 y.o. female presenting today for follow up of  Chief Complaint  Patient presents with  . Cough    had cough since Monday November 13, coughing up a little bit of yellow mucus  . Nasal Congestion    runny nose  . Wheezing    notice it yesterday November 15  .  Paula Bradley has a history of the following: Patient Active Problem List   Diagnosis Date Noted  . GERD (gastroesophageal reflux disease) 01/21/2015  . Allergic rhinitis 01/21/2015  . Hyperlipidemia 09/22/2014  . Obesity 01/04/2012  . Asthma 11/29/2011  . Anxiety 11/29/2011    History obtained from: chart review and patient.  Paula Bradley was referred by Margaree MackintoshBAXLEY,MARY J, MD.     Paula Bradley is a 55 y.o. female presenting for a sick visit. She was last seen in May 2017 by Dr. Lucie LeatherKozlow. At that time, she was doing well and having increased nasal congestion. She was diagnosed with acute sinusitis and started on a prednisone course as well as Mucinex and nasal saline. She was continued on her Qvar 2 puffs twice per day, ranitidine 300 mg evening, pantoprazole 40 mg once daily. She was also continued on her Singulair.  Since last visit, she has done fairly well. However, over the weekend she developed a sore throat, nasal congestion, and markedly here pain. She has had worsening wheezing as well as shortness of breath. She is on Qvar at baseline which she uses 2 puffs twice per day. She has increased it to 2 puffs 3 times per day per  Dr. Kathyrn LassKozlow's recommendations. She does not have a ProAir as she misplaced it several months ago. She has been trying Mucinex which does not help. She does not have any cough medicine that she has tried. She has not gone to the ED. She denies fevers. She does not murmur the last time that she needed prednisone for her breathing.  Patient recently sprained her left wrist as well as a left ankle secondary to an injury sustained from tripping over a  new copy at home. Otherwise, there've been no changes to her past medical history, past surgical history, or social history.  Otherwise, there have been no changes to her past medical history, surgical history, family history, or social history.    Review of Systems: a 14-point review of systems is pertinent for what is mentioned in HPI.  Otherwise, all other systems were negative. Constitutional: negative other than that listed in the HPI Eyes: negative other than that listed in the HPI Ears, nose, mouth, throat, and face: negative other than that listed in the HPI Respiratory: negative other than that listed in the HPI Cardiovascular: negative other than that listed in the HPI Gastrointestinal: negative other than that listed in the HPI Genitourinary: negative other than that listed in the HPI Integument: negative other than that listed in the HPI Hematologic: negative other than that listed in the HPI Musculoskeletal: negative other than that listed in the HPI Neurological: negative other than that listed in the HPI Allergy/Immunologic: negative other than that listed in the HPI    Objective:   Blood pressure 124/80, pulse 65, temperature 98.4 F (36.9 C), temperature source Oral, resp. rate 18, SpO2 95 %. There is no height or weight on file to calculate BMI.   Physical Exam:  General: Alert, interactive, in no acute distress. Cooperative with the exam. Difficulty forming sentences. HEENT: TMs pearly gray, turbinates edematous and pale without discharge, post-pharynx erythematous. Neck: Supple without thyromegaly. Lungs: Decreased breath sounds with expiratory wheezing bilaterally. Increased work of breathing. No crackles appreciated. Dry hacking cough. CV: Normal S1/S2, no murmurs. Capillary refill <2 seconds.  Abdomen: Nondistended, nontender. No guarding or rebound tenderness. Bowel sounds faint and present in all fields  Skin: Warm and dry, without lesions or  rashes. Extremities:  No clubbing, cyanosis or edema. Neuro:   Grossly intact. Alert and oriented.  Diagnostic studies:  Spirometry: results abnormal (FEV1: 1.54/58%, FVC: 1.80/54%, FEV1/FVC: 85%).    Spirometry consistent with possible restrictive disease. DuoNeb nebulizer treatment given in clinic with no improvement. Her FVC and FEV1 essentially remained unchanged. However her FEF 25-75 percent did increase 15%. Her inadequate technique might be related to her coughing during the exam.  Allergy Studies: None    Malachi BondsJoel Gallagher, MD [facility] [place]

## 2016-11-11 NOTE — Patient Instructions (Addendum)
[phone] [place] [phone] [address]1700 [id number] [place] [phone] LLCedToBradford Place Surgery And LasHealth and safety inspectorLC C>ckles hristKoreHealth and safety inspectorTo r At Las ColinIdell Pickleser60-17Idell cklesuis (20KoreaCampuSanford Med Ctr Thief Rvr FallnteDubuque Endoscopy Center Lcnd Kit9-17Louis8[facility]1848MAGCO Co224 Surgery Center LLucent TechnBaptist Orange Hospitallogiescott And WhA TTAG>th Network [facility] HospitalSt. Joseph Hospital - OrangeKermit Balo[phone] [place]

## 2016-11-12 LAB — LIPID PANEL
Cholesterol: 177 mg/dL (ref <200)
HDL: 73 mg/dL (ref >50)
LDL Cholesterol: 82 mg/dL (ref <100)
Total CHOL/HDL Ratio: 2.4 Ratio (ref <5.0)
Triglycerides: 108 mg/dL (ref <150)
VLDL: 22 mg/dL (ref <30)

## 2016-11-12 LAB — COMPLETE METABOLIC PANEL WITH GFR
ALT: 22 U/L (ref 6–29)
AST: 21 U/L (ref 10–35)
Albumin: 4.2 g/dL (ref 3.6–5.1)
Alkaline Phosphatase: 65 U/L (ref 33–130)
BUN: 21 mg/dL (ref 7–25)
CO2: 29 mmol/L (ref 20–31)
Calcium: 9.6 mg/dL (ref 8.6–10.4)
Chloride: 103 mmol/L (ref 98–110)
Creat: 0.75 mg/dL (ref 0.50–1.05)
GFR, Est African American: >89 mL/min (ref >=60)
GFR, Est Non African American: 89 mL/min (ref >=60)
Glucose, Bld: 101 mg/dL — ABNORMAL HIGH (ref 65–99)
Potassium: 4.5 mmol/L (ref 3.5–5.3)
Sodium: 141 mmol/L (ref 135–146)
Total Bilirubin: 0.5 mg/dL (ref 0.2–1.2)
Total Protein: 7.1 g/dL (ref 6.1–8.1)

## 2016-11-12 LAB — VITAMIN D 25 HYDROXY (VIT D DEFICIENCY, FRACTURES): Vit D, 25-Hydroxy: 40 ng/mL (ref 30–100)

## 2016-11-12 LAB — HEMOGLOBIN A1C
Hgb A1c MFr Bld: 5.2 % (ref <5.7)
Mean Plasma Glucose: 103 mg/dL

## 2016-11-16 ENCOUNTER — Encounter: Payer: Self-pay | Admitting: Internal Medicine

## 2016-11-16 ENCOUNTER — Telehealth: Payer: Self-pay | Admitting: Internal Medicine

## 2016-11-16 ENCOUNTER — Ambulatory Visit (INDEPENDENT_AMBULATORY_CARE_PROVIDER_SITE_OTHER): Payer: BLUE CROSS/BLUE SHIELD | Admitting: Internal Medicine

## 2016-11-16 ENCOUNTER — Ambulatory Visit
Admission: RE | Admit: 2016-11-16 | Discharge: 2016-11-16 | Disposition: A | Discharge status: Home / Self Care | Payer: 59 | Source: Ambulatory Visit | Attending: Internal Medicine | Admitting: Internal Medicine

## 2016-11-16 VITALS — BP 124/78 | HR 78 | Temp 98.5°F

## 2016-11-16 DIAGNOSIS — F411 Generalized anxiety disorder: Secondary | ICD-10-CM

## 2016-11-16 DIAGNOSIS — E785 Hyperlipidemia, unspecified: Secondary | ICD-10-CM

## 2016-11-16 DIAGNOSIS — R05 Cough: Secondary | ICD-10-CM

## 2016-11-16 DIAGNOSIS — K219 Gastro-esophageal reflux disease without esophagitis: Secondary | ICD-10-CM

## 2016-11-16 DIAGNOSIS — R7302 Impaired glucose tolerance (oral): Secondary | ICD-10-CM | POA: Diagnosis not present

## 2016-11-16 DIAGNOSIS — R059 Cough, unspecified: Secondary | ICD-10-CM

## 2016-11-16 DIAGNOSIS — Z8709 Personal history of other diseases of the respiratory system: Secondary | ICD-10-CM | POA: Diagnosis not present

## 2016-11-16 DIAGNOSIS — Z0000 Encounter for general adult medical examination without abnormal findings: Secondary | ICD-10-CM

## 2016-11-16 LAB — POCT URINALYSIS DIPSTICK
Bilirubin, UA: NEGATIVE
Blood, UA: NEGATIVE
Glucose, UA: NEGATIVE
Ketones, UA: NEGATIVE
Leukocytes, UA: NEGATIVE
Nitrite, UA: NEGATIVE
Protein, UA: NEGATIVE
Spec Grav, UA: 1.015
Urobilinogen, UA: NEGATIVE
pH, UA: 6

## 2016-11-16 MED ORDER — PREDNISONE 10 MG PO TABS: ORAL_TABLET | ORAL | 0 refills | Status: DC

## 2016-11-16 MED ORDER — CEFTRIAXONE SODIUM 1 G IJ SOLR
1.0000 g | Freq: Once | INTRAMUSCULAR | Status: AC
  Administered 2016-11-16: 1 g via INTRAMUSCULAR

## 2016-11-16 MED ORDER — HYDROCOD POLST-CPM POLST ER 10-8 MG/5ML PO SUER: 5.0000 mL | Freq: Two times a day (BID) | ORAL | 0 refills | Status: DC | PRN

## 2016-11-16 NOTE — Progress Notes (Signed)
Subjective:    Patient ID: Paula Bradley, female    DOB: 12/14/1961, 55 y.o.   MRN: [id number]  HPI  55 year old  White Female for health maintenance exam and evaluation of medical issues.  Took a fall about 4 weeks ago and sprained left wrist.   Declines flu vaccine.  History of hyperlipidemia, obesity, asthma, anxiety. She does not smoke. Long-standing history of anxiety which she says she inherited from her father. At one point she was drinking wine to control anxiety.  Tonsillectomy 1985, C-section 1996. Fractured left hand in the remote past. History of allergic rhinitis. History of estrogen replacement. Took Prozac for a number of years. She took Wellbutrin for a while.  Patient says she is allergic to Advair that it causes hives.  She's had a total of 4 pregnancies and 2 miscarriages.  Patient is seen Dr. Carmell AustriaAlexander Meyers, psychiatrist.  Dr. Terri PiedraLupton is her dermatologist  Dr. Deirdre EvenerHecker's or ophthalmologist.  Past. Done by gynecologist. Mammogram up-to-date. Tetanus imitation 2013.  Had colonoscopy September 2011.  History of impacted cerumen treated by Dr. Haroldine Lawsrossley.  Social history: She is married. Husband owns and operates [employer]. [place] live in [place]. She has a daughter who resides here nail and previously lived in [place]. Daughter is working with [employer]'s company. Son attending [employer].  Patient says she's had anxiety depression since pregnancy with her second child.  Family history: Both parents with history of strokes. Patient's sister lost a child in a drowning accident and subsequently had a nervous breakdown. Father with history of anxiety and hypertension. Mother with history of stroke, diabetes, MI, hypertension and goiter. Sister with history of hypertension. Brother with history of hypertension. Brother has had an MI.  Recently Came down with acute respiratory infection but untold then  had not had any recent  asthma attacks. Now has cough      Review of Systems anxiety seems improved     Objective:   Physical Exam  Constitutional: She is oriented to person, place, and time. She appears well-developed and well-nourished. No distress.  HENT:  Head: Normocephalic and atraumatic.  Right Ear: External ear normal.  Left Ear: External ear normal.  Mouth/Throat: Oropharynx is clear and moist. No oropharyngeal exudate.  Eyes: Conjunctivae and EOM are normal. Pupils are equal, round, and reactive to light. Right eye exhibits no discharge. Left eye exhibits no discharge. No scleral icterus.  Neck: Neck supple. No JVD present. No thyromegaly present.  Cardiovascular: Normal rate, regular rhythm, normal heart sounds and intact distal pulses.   No murmur heard. Pulmonary/Chest: Effort normal and breath sounds normal. No respiratory distress. She has no wheezes. She has no rales.  Abdominal: Soft. Bowel sounds are normal. She exhibits no distension. There is no tenderness. There is no rebound and no guarding.  Genitourinary:  Genitourinary Comments: Deferred to Dr. Audie BoxFontaine  Musculoskeletal: She exhibits no edema.  Lymphadenopathy:    She has no cervical adenopathy.  Neurological: She is alert and oriented to person, place, and time. She has normal reflexes. No cranial nerve deficit. Coordination normal.  Skin: Skin is warm and dry. No rash noted. She is not diaphoretic.  Psychiatric: She has a normal mood and affect. Her behavior is normal. Judgment and thought content normal.  Vitals reviewed.         Assessment & Plan:  History of impaired glucose tolerance-hemoglobin A1c 5.2% and previously was 5.7%. Fasting glucose 101  Hyperlipidemia-lipid panel normal  Obesity-TSH within normal  limits. Continue diet and exercise efforts  Metabolic syndrome  Allergic rhinitis  History of asthma  Cough/acute URI  History of anxiety- treated with Klonopin and Prozac  Plan: Previous weight 192  1/2 pounds last year. Continue same medications and return in 6 months for office visit, hemoglobin A1c, lipid panel liver functions.  Prednisone 10 mg 6 day dosepak to take in tapering course. Augmentin 875 mg twice daily for 10 days. Continue Qvar inhaler Tussionex 1 teaspoon by mouth every 12 hours when necessary cough. Continue PPI and Singulair.

## 2016-11-16 NOTE — Telephone Encounter (Signed)
Patient aware of chest xray results.

## 2016-11-16 NOTE — Telephone Encounter (Signed)
-----   Message from Margaree MackintoshMary J Baxley, MD sent at 11/16/2016  4:20 PM EST ----- Please call pt. No pneumonia.

## 2016-11-25 ENCOUNTER — Encounter: Payer: Self-pay | Admitting: Internal Medicine

## 2016-11-25 NOTE — Patient Instructions (Addendum)
It was a pleasure to see today. Continue same medications and return in 6 months. Take prednisone and Augmentin as directed. Toes and neck says needed sparingly for cough.

## 2016-12-24 ENCOUNTER — Other Ambulatory Visit: Payer: Self-pay | Admitting: Internal Medicine

## 2016-12-24 ENCOUNTER — Other Ambulatory Visit: Payer: Self-pay | Admitting: Allergy and Immunology

## 2017-01-31 ENCOUNTER — Telehealth: Payer: Self-pay | Admitting: Internal Medicine

## 2017-01-31 NOTE — Telephone Encounter (Signed)
[place]; states that she is vomiting with diarrhea, low grade fever.  Wants to know if you would [facility] [place] to assist her with nausea?  She has been sick x 1 day.  No complaints of cough/congestion/body aches at this time.  She has her puppy with her, but no one else.  She is calling someone to come and get the puppy to take it to be boarded.  She is too sick to take care of the puppy.    Spoke with Dr. Lenord FellersBaxley and she advised that she is too far away for us to try and treat this over the phone.  Patient could have the beginning stages of the flu.  And, she should go to a nearby Urgent Care and be treated.  Advised that she needs to be seen and someone treat her there.  Advised that we have seen a lot of flu and pneumonia and we don't want to just shrug this off as a virus and wait too late.    Patient verbalized understanding of these instructions and advised she would go somewhere there and be seen.

## 2017-02-09 ENCOUNTER — Ambulatory Visit: Payer: BLUE CROSS/BLUE SHIELD | Admitting: Allergy and Immunology

## 2017-02-23 ENCOUNTER — Encounter: Payer: Self-pay | Admitting: Allergy and Immunology

## 2017-02-23 ENCOUNTER — Ambulatory Visit (INDEPENDENT_AMBULATORY_CARE_PROVIDER_SITE_OTHER): Payer: BLUE CROSS/BLUE SHIELD | Admitting: Allergy and Immunology

## 2017-02-23 VITALS — BP 122/70 | HR 72 | Resp 16

## 2017-02-23 DIAGNOSIS — J4540 Moderate persistent asthma, uncomplicated: Secondary | ICD-10-CM

## 2017-02-23 DIAGNOSIS — J3089 Other allergic rhinitis: Secondary | ICD-10-CM | POA: Diagnosis not present

## 2017-02-23 DIAGNOSIS — K219 Gastro-esophageal reflux disease without esophagitis: Secondary | ICD-10-CM | POA: Diagnosis not present

## 2017-02-23 LAB — PULMONARY FUNCTION TEST

## 2017-02-23 NOTE — Patient Instructions (Addendum)
  1. Continue Qvar 80 two inhalations two times per day. Can increase to 3 inhalations 3 times per day as part of action plan for asthma flare  2. Continue Ranitidine 300 in PM. Can add Protonix 40 mg one time per day if needed   3. Continue montelukast 10 mg one tablet one time per day  4. Continue ProAir HFA and Zyrtec / Allergra if needed.  5. Can add over-the-counter Mucinex and nasal saline if needed  6. Return to clinic in 12 weeks or earlier if problem

## 2017-02-23 NOTE — Progress Notes (Addendum)
Follow-up Note  Referring Provider: Margaree Mackintosh, MD Primary Provider: Margaree Mackintosh, MD Date of Office Visit: 02/23/2017  Subjective:   Paula Bradley (DOB: [birth year]) is a 56 y.o. female who [facility]2017 in re-evaluation of the following:  HPI: Paula Bradley returns to this clinic in reevaluation of her asthma and allergic rhinoconjunctivitis and LPR. I have not seen her in this clinic since early spring 2017 but apparently she did visit with Dr. Dellis Anes in November for what appeared to be a infectious disease flare of her respiratory tract disease requiring the administration of Augmentin and a systemic steroid.   She did quite well regarding her asthma since that point in time although it did sound as though she required another systemic steroid and antibiotic and more cough medication to get over her event that occurred in Thanksgiving. Her requirement for short acting bronchodilator is now 0. She does not feel as though she has any chest congestion or coughing or wheezing. She does continue to use montelukast and is using an ICS only one time per day.  She's had very little issues with her upper airways and has not required any antibiotic. She does use a over-the-counter antihistamine on a pretty regular basis.  Reflux has not been a problem while consistently using ranitidine and very rarely some peptic process. She's had very little issues with her throat.  She did not obtain the flu vaccine and will not be doing so this year.  Allergies as of 02/23/2017      Reactions   Fluticasone-salmeterol    REACTION: hives   Scallops [shellfish Allergy] Hives      Medication List      albuterol 108 (90 Base) MCG/ACT inhaler Commonly known as:  PROAIR HFA Inhale 4 puffs into the lungs every 4 (four) hours as needed for wheezing or shortness of breath.   beclomethasone 80 MCG/ACT inhaler Commonly known as:  QVAR INHALE 2 PUFFS INTO THE  LUNGS TWICE DAILY TO PREVENT COUGH OR WHEEZE. RINSE, GARGLE AND SPIT AFTER USE   BOSWELLIA PO Take by mouth daily.   CALCIUM 500 PO Take by mouth.   chlorpheniramine-HYDROcodone 10-8 MG/5ML Suer Commonly known as:  TUSSIONEX PENNKINETIC ER Take 5 mLs by mouth every 12 (twelve) hours as needed for cough.   clonazePAM 1 MG tablet Commonly known as:  KLONOPIN TAKE 1 TABLET BY MOUTH TWICE DAILY AS NEEDED FOR ANXIETY   co-enzyme Q-10 30 MG capsule Take 30 mg by mouth 3 (three) times daily.   FISH OIL PO Take by mouth.   FLUoxetine 20 MG tablet Commonly known as:  PROZAC TAKE 1 TABLET BY MOUTH DAILY   MAGNESIUM CARBONATE PO Take by mouth.   montelukast 10 MG tablet Commonly known as:  SINGULAIR TAKE 1 TABLET BY MOUTH EVERY DAY AS DIRECTED   multivitamin tablet Take 1 tablet by mouth daily.   pantoprazole 40 MG tablet Commonly known as:  PROTONIX TAKE 1 TABLET BY MOUTH EVERY DAY FOR REFLUX   RANITIDINE HCL PO Take 300 mg by mouth at bedtime.   simvastatin 20 MG tablet Commonly known as:  ZOCOR TAKE 1 TABLET(20 MG) BY MOUTH AT BEDTIME   simvastatin 20 MG tablet Commonly known as:  ZOCOR TAKE 1 TABLET BY MOUTH EVERY NIGHT AT BEDTIME   TART CHERRY ADVANCED PO Take by mouth.   TURMERIC PO Take by mouth.   Vitamin D 2000 units tablet Take 2,000 Units  by mouth daily.       Past Medical History:  Diagnosis Date  . Allergic rhinitis   . Anxiety   . Anxiety and depression   . Asthma   . Depression   . Hyperlipidemia   . Menopause     Past Surgical History:  Procedure Laterality Date  . CESAREAN SECTION  1996  . COLPOSCOPY    . DILATION AND CURETTAGE OF UTERUS    . TONSILLECTOMY  1985    Review of systems negative except as noted in HPI / PMHx or noted below:  Review of Systems  Constitutional: Negative.   HENT: Negative.   Eyes: Negative.   Respiratory: Negative.   Cardiovascular: Negative.   Gastrointestinal: Negative.   Genitourinary:  Negative.   Musculoskeletal: Negative.   Skin: Negative.   Neurological: Negative.   Endo/Heme/Allergies: Negative.   Psychiatric/Behavioral: Negative.      Objective:   Vitals:   02/23/17 1100  BP: 122/70  Pulse: 72  Resp: 16          Physical Exam  Constitutional: She is well-developed, well-nourished, and in no distress.  HENT:  Head: Normocephalic.  Right Ear: Tympanic membrane, external ear and ear canal normal.  Left Ear: Tympanic membrane, external ear and ear canal normal.  Nose: Nose normal. No mucosal edema or rhinorrhea.  Mouth/Throat: Uvula is midline, oropharynx is clear and moist and mucous membranes are normal. No oropharyngeal exudate.  Eyes: Conjunctivae are normal.  Neck: Trachea normal. No tracheal tenderness present. No tracheal deviation present. No thyromegaly present.  Cardiovascular: Normal rate, regular rhythm, S1 normal, S2 normal and normal heart sounds.   No murmur heard. Pulmonary/Chest: Breath sounds normal. No stridor. No respiratory distress. She has no wheezes. She has no rales.  Musculoskeletal: She exhibits no edema.  Lymphadenopathy:       Head (right side): No tonsillar adenopathy present.       Head (left side): No tonsillar adenopathy present.    She has no cervical adenopathy.  Neurological: She is alert. Gait normal.  Skin: No rash noted. She is not diaphoretic. No erythema. Nails show no clubbing.  Psychiatric: Mood and affect normal.    Diagnostics:    Spirometry was performed and demonstrated an FEV1 of 1.64 at 60 % of predicted.  Assessment and Plan:   1. Asthma, moderate persistent, well-controlled   2. Other allergic rhinitis   3. LPRD (laryngopharyngeal reflux disease)      1. Continue Qvar 80 two inhalations two times per day. Can increase to 3 inhalations 3 times per day as part of action plan for asthma flare  2. Continue Ranitidine 300 in PM. Can add Protonix 40 mg one time per day if needed   3. Continue  montelukast 10 mg one tablet one time per day  4. Continue ProAir HFA and Zyrtec / Allergra if needed.  5. Can add over-the-counter Mucinex and nasal saline if needed  6. Return to clinic in 12 weeks or earlier if problem  Paula PintoRenne appears to have a little bit more activity of her asthma based upon her spirometry which is certainly depressed from her previous readings and I think would be worthwhile for her to consistently use her Qvar twice a day. Of course, she can activate an action plan should she develop a significant problem in the future. She will continue to use therapy directed against upper airway inflammation and reflux as noted above. I'll see her back in this clinic in 12 weeks  to make an assessment of her response to this approach.  Laurette Schimke, MD Allergy / Immunology [facility]

## 2017-02-24 ENCOUNTER — Encounter: Payer: Self-pay | Admitting: Allergy and Immunology

## 2017-04-08 ENCOUNTER — Telehealth: Payer: Self-pay | Admitting: Internal Medicine

## 2017-04-08 NOTE — Telephone Encounter (Signed)
Patient called this morning; states that she was drinking a glass of wine last night and didn't notice until she was finishing the glass that she had broken glass in her drink.  She's concerned that she may have swallowed some shards of glass.  She wants to know should she be concerned.  Advised patient that she should just be looking for digestion of this.  She's going out of town and leaving today for [employer] to visit her son for parents weekend.  Wanted to know if she should have an x-ray or what she should be looking for?

## 2017-04-08 NOTE — Telephone Encounter (Signed)
An X-ray not likely to show glass. Suggest staying on clear liquids for 24 hours. Seek emergency care if pain or bleeding. Probably will do OK

## 2017-04-29 ENCOUNTER — Encounter: Payer: Self-pay | Admitting: Gynecology

## 2017-05-02 ENCOUNTER — Ambulatory Visit (INDEPENDENT_AMBULATORY_CARE_PROVIDER_SITE_OTHER): Payer: BLUE CROSS/BLUE SHIELD | Admitting: Internal Medicine

## 2017-05-02 ENCOUNTER — Encounter: Payer: Self-pay | Admitting: Internal Medicine

## 2017-05-02 VITALS — BP 120/84 | HR 74 | Temp 99.0°F | Wt 192.0 lb

## 2017-05-02 DIAGNOSIS — F321 Major depressive disorder, single episode, moderate: Secondary | ICD-10-CM | POA: Diagnosis not present

## 2017-05-02 DIAGNOSIS — F439 Reaction to severe stress, unspecified: Secondary | ICD-10-CM | POA: Diagnosis not present

## 2017-05-02 DIAGNOSIS — F411 Generalized anxiety disorder: Secondary | ICD-10-CM

## 2017-05-02 MED ORDER — SERTRALINE HCL 50 MG PO TABS: 50.0000 mg | ORAL_TABLET | Freq: Every day | ORAL | 3 refills | Status: DC

## 2017-05-02 NOTE — Progress Notes (Signed)
   Subjective:    Patient ID: Paula Bradley, female    DOB: Sep 11, 1961, 56 y.o.   MRN: [id number]  HPI 56 year old Female in today to discuss medication for anxiety and depression. She feels that there is considerable situational stress with her daughter and her husband. She feels that she probably does too much for her daughter and it's not appreciated. Daughter and husband are sometimes critical of her in the way she keeps things in the house. This is upsetting to the patient. Patient says she is happy just when she is alone at her beach house. She doesn't know how to confront them when they are criticizing her. We talked about counseling. She would like to change antidepressant medication to see if it makes a difference in her attitude and fatigue level. She has appointment to follow up soon on hyperlipidemia. She has a dog who she loves but has been a real challenge in that he's been to the vet a number of times with various illnesses.    Review of Systems see above     Objective:   Physical Exam  Not examined but spent 25 minutes with patient today discussing these issues and coaching her with regard to how she reacts to the situations with her family members. Attempted to teach strategies for dealing with these areas issues. Realizes that time she drinks too much wine to try to deal with the stress. We talked about this at length. She has tried Prozac and Wellbutrin in the past. Currently just on Prozac 20 mg daily.        Assessment & Plan:  Anxiety depression  Situational stress  Plan: Zoloft 50 mg daily and follow-up at regular six-month recheck in a couple of weeks. She does have Klonopin on hand should she need it for anxiety. Watch wine consumption. Follow-up May 24 for six-month recheck. We will see how she's doing on Zoloft as well as follow-up on hyperlipidemia.   25 minutes spent with patient

## 2017-05-16 ENCOUNTER — Other Ambulatory Visit: Payer: Self-pay | Admitting: Allergy and Immunology

## 2017-05-17 ENCOUNTER — Other Ambulatory Visit: Payer: BLUE CROSS/BLUE SHIELD | Admitting: Internal Medicine

## 2017-05-17 DIAGNOSIS — E785 Hyperlipidemia, unspecified: Secondary | ICD-10-CM

## 2017-05-17 DIAGNOSIS — R7302 Impaired glucose tolerance (oral): Secondary | ICD-10-CM

## 2017-05-17 LAB — LIPID PANEL
Cholesterol: 174 mg/dL (ref <200)
HDL: 82 mg/dL (ref >50)
LDL Cholesterol: 73 mg/dL (ref <100)
Total CHOL/HDL Ratio: 2.1 Ratio (ref <5.0)
Triglycerides: 94 mg/dL (ref <150)
VLDL: 19 mg/dL (ref <30)

## 2017-05-17 LAB — HEPATIC FUNCTION PANEL
ALT: 20 U/L (ref 6–29)
AST: 18 U/L (ref 10–35)
Albumin: 4.2 g/dL (ref 3.6–5.1)
Alkaline Phosphatase: 61 U/L (ref 33–130)
Bilirubin, Direct: 0.1 mg/dL (ref <=0.2)
Indirect Bilirubin: 0.5 mg/dL (ref 0.2–1.2)
Total Bilirubin: 0.6 mg/dL (ref 0.2–1.2)
Total Protein: 6.7 g/dL (ref 6.1–8.1)

## 2017-05-17 NOTE — Progress Notes (Signed)
Labs drawn

## 2017-05-18 LAB — HEMOGLOBIN A1C
Hgb A1c MFr Bld: 5.3 % (ref <5.7)
Mean Plasma Glucose: 105 mg/dL

## 2017-05-19 ENCOUNTER — Encounter: Payer: Self-pay | Admitting: Internal Medicine

## 2017-05-19 ENCOUNTER — Ambulatory Visit (INDEPENDENT_AMBULATORY_CARE_PROVIDER_SITE_OTHER): Payer: BLUE CROSS/BLUE SHIELD | Admitting: Internal Medicine

## 2017-05-19 VITALS — BP 122/70 | HR 66 | Temp 98.1°F | Ht 66.0 in | Wt 188.0 lb

## 2017-05-19 DIAGNOSIS — K219 Gastro-esophageal reflux disease without esophagitis: Secondary | ICD-10-CM | POA: Diagnosis not present

## 2017-05-19 DIAGNOSIS — F411 Generalized anxiety disorder: Secondary | ICD-10-CM

## 2017-05-19 DIAGNOSIS — F439 Reaction to severe stress, unspecified: Secondary | ICD-10-CM

## 2017-05-19 DIAGNOSIS — R7302 Impaired glucose tolerance (oral): Secondary | ICD-10-CM | POA: Diagnosis not present

## 2017-05-19 DIAGNOSIS — E785 Hyperlipidemia, unspecified: Secondary | ICD-10-CM | POA: Diagnosis not present

## 2017-05-19 NOTE — Patient Instructions (Signed)
Lab work is within normal limits. Continue same medications and return in 6 months. Continue to work on diet and exercise. May try increasing Zoloft from 50-100 mg daily.

## 2017-05-19 NOTE — Progress Notes (Signed)
   Subjective:    Patient ID: Paula Bradley, female    DOB: 02-21-1961, 56 y.o.   MRN: [id number]  HPI   56 year old Female for follow up on anxiety depression. At last visit changed from Prozac to Zoloft. Feeling better mentally and realizing her limitations. Feels calmer on Zoloft.  Lipid panel excellent on Zocor 20 mg daily.  Can try Zoloft 100 mg daily if so desires.  Asthma stable at this time.  Has not pursued counseling at this point. Feeling better about herself and how she is handling situations with her daughter and husband.  Liver functions normal.  Hemoglobin A1c within normal limits.  Encouraged her to continue with diet and exercise regimen    Review of Systems see above     Objective:   Physical Exam  Skin warm and dry. Nodes none. Chest clear. Cardiac exam regular rate and rhythm. Extremities without edema.      Assessment & Plan:  Anxiety depression-may increase Zoloft 100 mg daily. Certainly better on Zoloft and Prozac. Feels calm her.  Situational stress-learning to handle things better with family    Glucose tolerance-11 A1c normal  Hyperlipidemia-lipid panel liver functions normal  Asthma-stable  Plan: Return in 6 months for physical examination and fasting lab work. Continue same medications as described above.

## 2017-05-22 NOTE — Patient Instructions (Signed)
Change Prozac to Zoloft 50 mg daily and follow-up will May 24. Watch wine consumption.

## 2017-05-30 ENCOUNTER — Other Ambulatory Visit: Payer: Self-pay | Admitting: Internal Medicine

## 2017-05-30 NOTE — Telephone Encounter (Signed)
This is the same RX. Refill one RX for 6 months

## 2017-05-31 ENCOUNTER — Ambulatory Visit (INDEPENDENT_AMBULATORY_CARE_PROVIDER_SITE_OTHER): Payer: BLUE CROSS/BLUE SHIELD | Admitting: Allergy and Immunology

## 2017-05-31 ENCOUNTER — Encounter: Payer: Self-pay | Admitting: Allergy and Immunology

## 2017-05-31 VITALS — BP 110/78 | HR 92 | Resp 16

## 2017-05-31 DIAGNOSIS — K219 Gastro-esophageal reflux disease without esophagitis: Secondary | ICD-10-CM

## 2017-05-31 DIAGNOSIS — J3089 Other allergic rhinitis: Secondary | ICD-10-CM | POA: Diagnosis not present

## 2017-05-31 DIAGNOSIS — J4540 Moderate persistent asthma, uncomplicated: Secondary | ICD-10-CM | POA: Diagnosis not present

## 2017-05-31 MED ORDER — BECLOMETHASONE DIPROP HFA 80 MCG/ACT IN AERB: INHALATION_SPRAY | RESPIRATORY_TRACT | 5 refills | Status: DC

## 2017-05-31 NOTE — Patient Instructions (Addendum)
  1. Continue Qvar 80 REDIHALER two inhalations two times per day.   2. Continue Ranitidine 300 in PM. Can add Protonix 40 mg one time per day if needed   3. Continue montelukast 10 mg one tablet one time per day  4. Continue ProAir HFA and Zyrtec / Allergra if needed.  5. Can add over-the-counter Mucinex and nasal saline if needed  6. Can add OTC Nasacrot one spray each nostril 3-7 times per week  7. "Action plan" for asthma flare up:   A. increase Qvar to 3 inhalations 3 times a day  B. use ProAir HFA if needed  8. Return to clinic in 12 weeks or earlier if problem

## 2017-05-31 NOTE — Progress Notes (Signed)
Follow-up Note  Referring Provider: Margaree MackintoshBaxley, Mary J, MD Primary Provider: Margaree MackintoshBaxley, Mary J, MD Date of Office Visit: 05/31/2017  Subjective:   Paula Bradley (DOB: 12/23/1961) is a 56 y.o. female [facility] in re-evaluation of the following:  HPI: Paula Bradley returns to this clinic in reevaluation of her asthma and allergic rhinoconjunctivitis and LPR. Her last visit to this clinic was February 2018.  Overall she has done relatively well with her asthma. She rarely uses a short acting bronchodilator and she exercises with walking and weights and biking without any problem. She has not required a systemic steroid to treat an exacerbation. She has only been using her Qvar one time per day.  Likewise she thinks that her nose is doing well although she has developed some runny nose and some sneezing the spring without any fever nor anosmia or decreased ability to taste or headaches.  Reflux has been under excellent control on her current medical therapy. She uses ranitidine every day and occasionally some Protonix  Allergies as of 05/31/2017      Reactions   Fluticasone-salmeterol    REACTION: hives   Scallops [shellfish Allergy] Hives      Medication List      albuterol 108 (90 Base) MCG/ACT inhaler Commonly known as:  PROAIR HFA Inhale 4 puffs into the lungs every 4 (four) hours as needed for wheezing or shortness of breath.   beclomethasone 80 MCG/ACT inhaler Commonly known as:  QVAR INHALE 2 PUFFS INTO THE LUNGS TWICE DAILY TO PREVENT COUGH OR WHEEZE. RINSE, GARGLE AND SPIT AFTER USE   BOSWELLIA PO Take by mouth daily.   CALCIUM 500 PO Take by mouth.   clonazePAM 1 MG tablet Commonly known as:  KLONOPIN TAKE 1 TABLET BY MOUTH TWICE DAILY   co-enzyme Q-10 30 MG capsule Take 30 mg by mouth 3 (three) times daily.   FISH OIL PO Take by mouth.   MAGNESIUM CARBONATE PO Take by mouth.   montelukast 10 MG tablet Commonly  known as:  SINGULAIR Take 1 tablet (10 mg total) by mouth daily.   multivitamin tablet Take 1 tablet by mouth daily.   pantoprazole 40 MG tablet Commonly known as:  PROTONIX TAKE 1 TABLET BY MOUTH EVERY DAY FOR REFLUX   RANITIDINE HCL PO Take 300 mg by mouth at bedtime.   sertraline 50 MG tablet Commonly known as:  ZOLOFT Take 1 tablet (50 mg total) by mouth daily.   simvastatin 20 MG tablet Commonly known as:  ZOCOR TAKE 1 TABLET(20 MG) BY MOUTH AT BEDTIME   TART CHERRY ADVANCED PO Take by mouth.   TURMERIC PO Take by mouth.   Vitamin D 2000 units tablet Take 2,000 Units by mouth daily.       Past Medical History:  Diagnosis Date  . Allergic rhinitis   . Anxiety   . Anxiety and depression   . Asthma   . Depression   . Hyperlipidemia   . Menopause     Past Surgical History:  Procedure Laterality Date  . CESAREAN SECTION  1996  . COLPOSCOPY    . DILATION AND CURETTAGE OF UTERUS    . TONSILLECTOMY  1985    Review of systems negative except as noted in HPI / PMHx or noted below:  Review of Systems  Constitutional: Negative.   HENT: Negative.   Eyes: Negative.   Respiratory: Negative.   Cardiovascular: Negative.   Gastrointestinal: Negative.  Genitourinary: Negative.   Musculoskeletal: Negative.   Skin: Negative.   Neurological: Negative.   Endo/Heme/Allergies: Negative.   Psychiatric/Behavioral: Negative.      Objective:   Vitals:   05/31/17 1143  BP: 110/78  Pulse: 92  Resp: 16          Physical Exam  Constitutional: She is well-developed, well-nourished, and in no distress.  HENT:  Head: Normocephalic.  Right Ear: Tympanic membrane, external ear and ear canal normal.  Left Ear: Tympanic membrane, external ear and ear canal normal.  Nose: Nose normal. No mucosal edema or rhinorrhea.  Mouth/Throat: Uvula is midline, oropharynx is clear and moist and mucous membranes are normal. No oropharyngeal exudate.  Eyes: Conjunctivae are  normal.  Neck: Trachea normal. No tracheal tenderness present. No tracheal deviation present. No thyromegaly present.  Cardiovascular: Normal rate, regular rhythm, S1 normal, S2 normal and normal heart sounds.   No murmur heard. Pulmonary/Chest: Breath sounds normal. No stridor. No respiratory distress. She has no wheezes. She has no rales.  Musculoskeletal: She exhibits no edema.  Lymphadenopathy:       Head (right side): No tonsillar adenopathy present.       Head (left side): No tonsillar adenopathy present.    She has no cervical adenopathy.  Neurological: She is alert. Gait normal.  Skin: No rash noted. She is not diaphoretic. No erythema. Nails show no clubbing.  Psychiatric: Mood and affect normal.    Diagnostics:    Spirometry was performed and demonstrated an FEV1 of 1.82 at 64 % of predicted.  The patient had an Asthma Control Test with the following results:  .    Assessment and Plan:   1. Asthma, moderate persistent, well-controlled   2. Other allergic rhinitis   3. LPRD (laryngopharyngeal reflux disease)      1. Continue Qvar 80 REDIHALER two inhalations two times per day.   2. Continue Ranitidine 300 in PM. Can add Protonix 40 mg one time per day if needed   3. Continue montelukast 10 mg one tablet one time per day  4. Continue ProAir HFA and Zyrtec / Allergra if needed.  5. Can add over-the-counter Mucinex and nasal saline if needed  6. Can add OTC Nasacrot one spray each nostril 3-7 times per week  7. "Action plan" for asthma flare up:   A. increase Qvar to 3 inhalations 3 times a day  B. use ProAir HFA if needed  8. Return to clinic in 12 weeks or earlier if problem  Paula Pinto appears to be doing well although certainly her spirometry is still suppressed. All other measures of asthma control appeared to be good. I would like for her to use her Qvar twice a day on a consistent basis to see if we can possibly increase her lung function. She will continue on  therapy for reflux and upper airway inflammation as well as noted above. I will see her back in this clinic in 12 weeks or earlier if there is a problem.  Laurette Schimke, MD Allergy / Immunology [facility]

## 2017-06-14 ENCOUNTER — Other Ambulatory Visit: Payer: Self-pay | Admitting: Allergy and Immunology

## 2017-08-04 ENCOUNTER — Telehealth: Payer: Self-pay

## 2017-08-04 ENCOUNTER — Other Ambulatory Visit: Payer: Self-pay

## 2017-08-04 MED ORDER — SIMVASTATIN 20 MG PO TABS: ORAL_TABLET | ORAL | 3 refills | Status: DC

## 2017-08-04 MED ORDER — ALBUTEROL SULFATE HFA 108 (90 BASE) MCG/ACT IN AERS: 4.0000 | INHALATION_SPRAY | RESPIRATORY_TRACT | 2 refills | Status: DC | PRN

## 2017-08-04 NOTE — Telephone Encounter (Signed)
[facility] Sim[facility]atin 20mg  for patient. Medication was refilled per Dr. Beryle QuantBaxley's request. Sent one year supply

## 2017-08-16 ENCOUNTER — Ambulatory Visit: Payer: BLUE CROSS/BLUE SHIELD | Admitting: Allergy and Immunology

## 2017-08-23 ENCOUNTER — Other Ambulatory Visit: Payer: Self-pay | Admitting: Internal Medicine

## 2017-09-06 ENCOUNTER — Ambulatory Visit: Payer: BLUE CROSS/BLUE SHIELD | Admitting: Allergy and Immunology

## 2017-09-14 ENCOUNTER — Ambulatory Visit (INDEPENDENT_AMBULATORY_CARE_PROVIDER_SITE_OTHER): Payer: BLUE CROSS/BLUE SHIELD | Admitting: Allergy and Immunology

## 2017-09-14 VITALS — BP 124/72 | HR 74 | Temp 98.2°F | Resp 16

## 2017-09-14 DIAGNOSIS — J4541 Moderate persistent asthma with (acute) exacerbation: Secondary | ICD-10-CM

## 2017-09-14 DIAGNOSIS — J3089 Other allergic rhinitis: Secondary | ICD-10-CM | POA: Diagnosis not present

## 2017-09-14 DIAGNOSIS — K219 Gastro-esophageal reflux disease without esophagitis: Secondary | ICD-10-CM

## 2017-09-14 NOTE — Patient Instructions (Signed)
  1. Continue Qvar 80 REDIHALER two inhalations two times per day.   2. Continue Ranitidine 300 in PM. Can add Protonix 40 mg one time per day if needed   3. Continue montelukast 10 mg one tablet one time per day  4. Continue ProAir HFA and Zyrtec / Allergra if needed.  5. Can add over-the-counter Mucinex and nasal saline if needed  6. Can add OTC Nasacort one spray each nostril 3-7 times per week  7. "Action plan" for asthma flare up:   A. increase Qvar to 3 inhalations 3 times a day  B. use ProAir HFA if needed  8. For this most recent episode utilize prednisone 10 mg tablet 1 tablet once a day for the next 10 days  9. Return to clinic in 12 weeks or earlier if problem  10. When better obtained fall flu vaccine

## 2017-09-14 NOTE — Progress Notes (Signed)
Follow-up Note  Referring Provider: Margaree Mackintosh, MD Primary Provider: Margaree Mackintosh, MD Date of Office Visit: 09/14/2017  Subjective:   Paula Bradley (DOB: March 06, 1961) is a 56 y.o. female who [facility]2017 in re-evaluation of the following:  HPI: Paula Bradley returns to this clinic in reevaluation of her asthma and allergic rhinoconjunctivitis and LPR. Her last visit to this clinic was 05/31/2017.  She was doing wonderful without any significant issue involving either her upper or lower airway and her reflux is under excellent control. She has not required a systemic steroid or an antibiotic to treat a respiratory tract issue. Rarely does she use a short acting bronchodilator.  Unfortunately, during [place] her sister and family evacuated from the beach and had been at her house and they are sick with a upper respiratory tract infection. Paula Bradley has developed head congestion and nasal congestion and coughing and hacking and has been using a bronchodilator twice a day. She did activate her action plan by increasing her inhaled steroid and also introduced Protonix to her usual dose of ranitidine. She does not have any anosmia or or ugly nasal discharge or significant headaches or sputum production or chest pain or fever.  Allergies as of 09/14/2017      Reactions   Fluticasone-salmeterol    REACTION: hives   Scallops [shellfish Allergy] Hives      Medication List      albuterol 108 (90 Base) MCG/ACT inhaler Commonly known as:  PROAIR HFA Inhale 4 puffs into the lungs every 4 (four) hours as needed for wheezing or shortness of breath.   beclomethasone 80 MCG/ACT inhaler Commonly known as:  QVAR REDIHALER Inhale two puffs twice daily to prevent cough or wheeze   BOSWELLIA PO Take by mouth daily.   CALCIUM 500 PO Take by mouth.   clonazePAM 1 MG tablet Commonly known as:  KLONOPIN TAKE 1 TABLET BY MOUTH TWICE DAILY     co-enzyme Q-10 30 MG capsule Take 30 mg by mouth 3 (three) times daily.   FISH OIL PO Take by mouth.   MAGNESIUM CARBONATE PO Take by mouth.   montelukast 10 MG tablet Commonly known as:  SINGULAIR Take 1 tablet (10 mg total) by mouth daily.   multivitamin tablet Take 1 tablet by mouth daily.   pantoprazole 40 MG tablet Commonly known as:  PROTONIX TAKE 1 TABLET BY MOUTH EVERY DAY FOR REFLUX   RANITIDINE HCL PO Take 300 mg by mouth at bedtime.   sertraline 50 MG tablet Commonly known as:  ZOLOFT TAKE 1 TABLET(50 MG) BY MOUTH DAILY   simvastatin 20 MG tablet Commonly known as:  ZOCOR TAKE 1 TABLET(20 MG) BY MOUTH AT BEDTIME   TART CHERRY ADVANCED PO Take by mouth.   TURMERIC PO Take by mouth.   Vitamin D 2000 units tablet Take 2,000 Units by mouth daily.       Past Medical History:  Diagnosis Date  . Allergic rhinitis   . Anxiety   . Anxiety and depression   . Asthma   . Depression   . Hyperlipidemia   . Menopause     Past Surgical History:  Procedure Laterality Date  . CESAREAN SECTION  1996  . COLPOSCOPY    . DILATION AND CURETTAGE OF UTERUS    . TONSILLECTOMY  1985    Review of systems negative except as noted in HPI / PMHx or noted below:  Review of  Systems  Constitutional: Negative.   HENT: Negative.   Eyes: Negative.   Respiratory: Negative.   Cardiovascular: Negative.   Gastrointestinal: Negative.   Genitourinary: Negative.   Musculoskeletal: Negative.   Skin: Negative.   Neurological: Negative.   Endo/Heme/Allergies: Negative.   Psychiatric/Behavioral: Negative.      Objective:   Vitals:   09/14/17 1009  BP: 124/72  Pulse: 74  Resp: 16  Temp: 98.2 F (36.8 C)  SpO2: 97%          Physical Exam  Constitutional: She is well-developed, well-nourished, and in no distress.  HENT:  Head: Normocephalic.  Right Ear: Tympanic membrane, external ear and ear canal normal.  Left Ear: Tympanic membrane, external ear and ear  canal normal.  Nose: Nose normal. No mucosal edema or rhinorrhea.  Mouth/Throat: Uvula is midline, oropharynx is clear and moist and mucous membranes are normal. No oropharyngeal exudate.  Eyes: Conjunctivae are normal.  Neck: Trachea normal. No tracheal tenderness present. No tracheal deviation present. No thyromegaly present.  Cardiovascular: Normal rate, regular rhythm, S1 normal, S2 normal and normal heart sounds.   No murmur heard. Pulmonary/Chest: Breath sounds normal. No stridor. No respiratory distress. She has no wheezes. She has no rales.  Musculoskeletal: She exhibits no edema.  Lymphadenopathy:       Head (right side): No tonsillar adenopathy present.       Head (left side): No tonsillar adenopathy present.    She has no cervical adenopathy.  Neurological: She is alert. Gait normal.  Skin: No rash noted. She is not diaphoretic. No erythema. Nails show no clubbing.  Psychiatric: Mood and affect normal.    Diagnostics:    Spirometry was performed and demonstrated an FEV1 of 1.49 at 52 % of predicted.  Assessment and Plan:   1. Asthma, not well controlled, moderate persistent, with acute exacerbation   2. Other allergic rhinitis   3. LPRD (laryngopharyngeal reflux disease)      1. Continue Qvar 80 REDIHALER two inhalations two times per day.   2. Continue Ranitidine 300 in PM. Can add Protonix 40 mg one time per day if needed   3. Continue montelukast 10 mg one tablet one time per day  4. Continue ProAir HFA and Zyrtec / Allergra if needed.  5. Can add over-the-counter Mucinex and nasal saline if needed  6. Can add OTC Nasacort one spray each nostril 3-7 times per week  7. "Action plan" for asthma flare up:   A. increase Qvar to 3 inhalations 3 times a day  B. use ProAir HFA if needed  8. For this most recent episode utilize prednisone 10 mg tablet 1 tablet once a day for the next 10 days  9. Return to clinic in 12 weeks or earlier if problem  10. When  better obtained fall flu vaccine  It appears that Paula Bradley has developed an upper respiratory tract infection that is given rise to a slight asthma flare and she will utilize the plan mentioned above which includes anti-inflammatory medications in addition to a systemic steroid and also continue to aggressively treat her reflux. Assuming she does well I will see her back in this clinic in 12 weeks or earlier if there is a problem.   Laurette Schimke, MD [facility]ergy [facility] Immunology  [facility]

## 2017-09-15 ENCOUNTER — Encounter: Payer: Self-pay | Admitting: Allergy and Immunology

## 2017-09-16 ENCOUNTER — Telehealth: Payer: Self-pay

## 2017-09-16 NOTE — Telephone Encounter (Signed)
Pt states she was seen by Dr. Lucie Leather on 09/14/17. She is still not feeling any better from her sick visit. She is continuing her Qvar 3 puffs TID, Prednisone 10 mg for 10 days, and her other routine medications. I went to Dr. Delorse Lek for advise. Per Dr. Delorse Lek, I advised the pt to double her prednisone over the weekend and call us on Monday for an update. She is to take 10 mg BID over the weekend. She is going out of town to address her beach home, so we will need to use an outside pharmacy if we need to send her anything on Monday.

## 2017-09-19 MED ORDER — PREDNISONE 10 MG PO TABS: ORAL_TABLET | ORAL | 0 refills | Status: DC

## 2017-09-19 NOTE — Telephone Encounter (Signed)
Patient has called back stating that she will need a refill on her prendisone  [place] The increase in the medication seems to be helping but she is now running out of medication. Please send in prednisone  to [facility]  Please call patient with any questions

## 2017-09-19 NOTE — Telephone Encounter (Signed)
Can refill prednisone at  one time per day for an additional 5 days

## 2017-09-19 NOTE — Addendum Note (Signed)
Addended by: Marthann Schiller on: 09/19/2017 10:23 AM   Modules accepted: Orders

## 2017-09-19 NOTE — Telephone Encounter (Signed)
Called patient. I informed her that we sent in the [facility]nisone to the [facility].

## 2017-09-19 NOTE — Telephone Encounter (Signed)
Please advise 

## 2017-09-21 ENCOUNTER — Encounter: Payer: BLUE CROSS/BLUE SHIELD | Admitting: Gynecology

## 2017-09-30 ENCOUNTER — Telehealth: Payer: Self-pay

## 2017-09-30 ENCOUNTER — Emergency Department (HOSPITAL_BASED_OUTPATIENT_CLINIC_OR_DEPARTMENT_OTHER): Payer: BLUE CROSS/BLUE SHIELD

## 2017-09-30 ENCOUNTER — Encounter (HOSPITAL_BASED_OUTPATIENT_CLINIC_OR_DEPARTMENT_OTHER): Payer: Self-pay | Admitting: *Deleted

## 2017-09-30 ENCOUNTER — Emergency Department (HOSPITAL_BASED_OUTPATIENT_CLINIC_OR_DEPARTMENT_OTHER)
Admission: EM | Admit: 2017-09-30 | Discharge: 2017-09-30 | Disposition: A | Discharge status: Home / Self Care | Payer: BLUE CROSS/BLUE SHIELD | Attending: Emergency Medicine | Admitting: Emergency Medicine

## 2017-09-30 DIAGNOSIS — R079 Chest pain, unspecified: Secondary | ICD-10-CM | POA: Diagnosis not present

## 2017-09-30 DIAGNOSIS — Z79899 Other long term (current) drug therapy: Secondary | ICD-10-CM | POA: Insufficient documentation

## 2017-09-30 DIAGNOSIS — N200 Calculus of kidney: Secondary | ICD-10-CM

## 2017-09-30 DIAGNOSIS — J45909 Unspecified asthma, uncomplicated: Secondary | ICD-10-CM | POA: Diagnosis not present

## 2017-09-30 DIAGNOSIS — R1013 Epigastric pain: Secondary | ICD-10-CM | POA: Diagnosis not present

## 2017-09-30 DIAGNOSIS — R1011 Right upper quadrant pain: Secondary | ICD-10-CM | POA: Diagnosis present

## 2017-09-30 LAB — CBC WITH DIFFERENTIAL/PLATELET
Basophils Absolute: 0 10*3/uL (ref 0.0–0.1)
Basophils Relative: 0 %
Eosinophils Absolute: 0.1 10*3/uL (ref 0.0–0.7)
Eosinophils Relative: 1 %
HCT: 38.6 % (ref 36.0–46.0)
Hemoglobin: 12.9 g/dL (ref 12.0–15.0)
Lymphocytes Relative: 10 %
Lymphs Abs: 1.2 10*3/uL (ref 0.7–4.0)
MCH: 31.3 pg (ref 26.0–34.0)
MCHC: 33.4 g/dL (ref 30.0–36.0)
MCV: 93.7 fL (ref 78.0–100.0)
Monocytes Absolute: 1.4 10*3/uL — ABNORMAL HIGH (ref 0.1–1.0)
Monocytes Relative: 12 %
Neutro Abs: 9.1 10*3/uL — ABNORMAL HIGH (ref 1.7–7.7)
Neutrophils Relative %: 77 %
Platelets: 217 10*3/uL (ref 150–400)
RBC: 4.12 MIL/uL (ref 3.87–5.11)
RDW: 12 % (ref 11.5–15.5)
WBC: 11.8 10*3/uL — ABNORMAL HIGH (ref 4.0–10.5)

## 2017-09-30 LAB — COMPREHENSIVE METABOLIC PANEL
ALT: 25 U/L (ref 14–54)
AST: 24 U/L (ref 15–41)
Albumin: 3.9 g/dL (ref 3.5–5.0)
Alkaline Phosphatase: 63 U/L (ref 38–126)
Anion gap: 8 (ref 5–15)
BUN: 8 mg/dL (ref 6–20)
CO2: 26 mmol/L (ref 22–32)
Calcium: 8.9 mg/dL (ref 8.9–10.3)
Chloride: 103 mmol/L (ref 101–111)
Creatinine, Ser: 0.5 mg/dL (ref 0.44–1.00)
GFR calc Af Amer: >60 mL/min (ref >60)
GFR calc non Af Amer: >60 mL/min (ref >60)
Glucose, Bld: 140 mg/dL — ABNORMAL HIGH (ref 65–99)
Potassium: 3.1 mmol/L — ABNORMAL LOW (ref 3.5–5.1)
Sodium: 137 mmol/L (ref 135–145)
Total Bilirubin: 0.3 mg/dL (ref 0.3–1.2)
Total Protein: 7.5 g/dL (ref 6.5–8.1)

## 2017-09-30 LAB — URINALYSIS, ROUTINE W REFLEX MICROSCOPIC
Bilirubin Urine: NEGATIVE
Glucose, UA: NEGATIVE mg/dL
Hgb urine dipstick: NEGATIVE
Ketones, ur: NEGATIVE mg/dL
Leukocytes, UA: NEGATIVE
Nitrite: NEGATIVE
Protein, ur: NEGATIVE mg/dL
Specific Gravity, Urine: <1.005 — ABNORMAL LOW (ref 1.005–1.030)
pH: 6 (ref 5.0–8.0)

## 2017-09-30 LAB — LIPASE, BLOOD: Lipase: 43 U/L (ref 11–51)

## 2017-09-30 LAB — TROPONIN I: Troponin I: <0.03 ng/mL (ref <0.03)

## 2017-09-30 MED ORDER — FAMOTIDINE IN NACL 20-0.9 MG/50ML-% IV SOLN
20.0000 mg | Freq: Once | INTRAVENOUS | Status: AC
  Administered 2017-09-30: 20 mg via INTRAVENOUS
  Filled 2017-09-30: qty 50

## 2017-09-30 MED ORDER — SODIUM CHLORIDE 0.9 % IV BOLUS (SEPSIS)
1000.0000 mL | Freq: Once | INTRAVENOUS | Status: AC
  Administered 2017-09-30: 1000 mL via INTRAVENOUS

## 2017-09-30 MED ORDER — ONDANSETRON HCL 4 MG/2ML IJ SOLN
4.0000 mg | Freq: Once | INTRAMUSCULAR | Status: AC
  Administered 2017-09-30: 4 mg via INTRAVENOUS
  Filled 2017-09-30: qty 2

## 2017-09-30 MED ORDER — GI COCKTAIL ~~LOC~~
30.0000 mL | Freq: Once | ORAL | Status: AC
  Administered 2017-09-30: 30 mL via ORAL
  Filled 2017-09-30: qty 30

## 2017-09-30 MED ORDER — HYDROCODONE-ACETAMINOPHEN 5-325 MG PO TABS: 1.0000 | ORAL_TABLET | Freq: Four times a day (QID) | ORAL | 0 refills | Status: DC | PRN

## 2017-09-30 NOTE — Telephone Encounter (Signed)
Agree with ED evaluation. 

## 2017-09-30 NOTE — ED Triage Notes (Signed)
Epigastric pain with radiation into her right upper quadrant off and on since last night.

## 2017-09-30 NOTE — Telephone Encounter (Signed)
Pt is on a girls trip in [place] and is coming home early, she has been having severe chest pain for multiple days that at first she thought was gas. She took gas-x and that did not give her any relief. She said that pain is not radiating to her back. Her flight lands after 5pm and wanted to make you aware she will be going to the ER when she lands. She just wanted to have you updated on this and be aware of why she was going to the ER.

## 2017-09-30 NOTE — ED Notes (Signed)
ED Provider at bedside. 

## 2017-09-30 NOTE — ED Provider Notes (Signed)
[facility] Provider Note   CSN: [id number] Arrival date & time: 09/30/17  1812     History   Chief Complaint Chief Complaint  Patient presents with  . Chest Pain    HPI Paula Bradley is a 56 y.o. female history of asthma, depression, hyperlipidemia, anxiety here presenting with right upper quadrant pain, epigastric pain, chest pain. Patient states that she recently finished a course of steroids for her asthma and has intermittent right upper quadrant pain that is worse with eating. She had previous workup for the right upper quadrant pain including normal ultrasound as well as normal HIDA scan previously. Patient states she ate some food last night and the pain is persistent all night long. She did take some ranitidine but it did not help her pain. States that the pain radiated to the right flank area and epigastric area. Denies any vomiting or diarrhea. Denies any recent travel or history for clots or history of coronary artery disease.    The history is provided by the patient.    Past Medical History:  Diagnosis Date  . Allergic rhinitis   . Anxiety   . Anxiety and depression   . Asthma   . Depression   . Hyperlipidemia   . Menopause     Patient Active Problem List   Diagnosis Date Noted  . GERD (gastroesophageal reflux disease) 01/21/2015  . Allergic rhinitis 01/21/2015  . Hyperlipidemia 09/22/2014  . Obesity 01/04/2012  . Asthma 11/29/2011  . Anxiety 11/29/2011    Past Surgical History:  Procedure Laterality Date  . CESAREAN SECTION  1996  . COLPOSCOPY    . DILATION AND CURETTAGE OF UTERUS    . TONSILLECTOMY  1985    OB History    Gravida Para Term Preterm AB Living   SAB TAB Ectopic Multiple Live Births                   Home Medications    Prior to Admission medications   Medication Sig Start Date End Date Taking? Authorizing Provider  albuterol (PROAIR HFA) 108 (90 Base) MCG/ACT inhaler Inhale 4 puffs into the  lungs every 4 (four) hours as needed for wheezing or shortness of breath. 08/04/17   Kozlow, Alvira Philips, MD  beclomethasone (QVAR REDIHALER) 80 MCG/ACT inhaler Inhale two puffs twice daily to prevent cough or wheeze 05/31/17   Kozlow, Alvira Philips, MD  Boswellia Serrata (BOSWELLIA PO) Take by mouth daily.    [provider]  Calcium-Magnesium-Vitamin D (CALCIUM 500 PO) Take by mouth.    [provider]  Cholecalciferol (VITAMIN D) 2000 UNITS tablet Take 2,000 Units by mouth daily.      [provider]  clonazePAM (KLONOPIN) 1 MG tablet TAKE 1 TABLET BY MOUTH TWICE DAILY 05/31/17   Margaree Mackintosh, MD  co-enzyme Q-10 30 MG capsule Take 30 mg by mouth 3 (three) times daily.    [provider]  MAGNESIUM CARBONATE PO Take by mouth.    [provider]  Misc Natural Products (TART CHERRY ADVANCED PO) Take by mouth.    [provider]  montelukast (SINGULAIR) 10 MG tablet Take 1 tablet (10 mg total) by mouth daily. 11/11/16   Alfonse Spruce, MD  Multiple Vitamin (MULTIVITAMIN) tablet Take 1 tablet by mouth daily.    [provider]  Omega-3 Fatty Acids (FISH OIL PO) Take by mouth.    [provider]  pantoprazole (PROTONIX) 40 MG tablet TAKE 1 TABLET BY MOUTH EVERY DAY FOR REFLUX 03/11/16   Margaree Mackintosh, MD  predniSONE (DELTASONE) 10 MG tablet Take 2 tablets daily for 5 days. 09/19/17   Kozlow, Alvira Philips, MD  RANITIDINE HCL PO Take 300 mg by mouth at bedtime.     [provider]  sertraline (ZOLOFT) 50 MG tablet TAKE 1 TABLET(50 MG) BY MOUTH DAILY 08/23/17   Margaree Mackintosh, MD  simvastatin (ZOCOR) 20 MG tablet TAKE 1 TABLET(20 MG) BY MOUTH AT BEDTIME 08/04/17   Margaree Mackintosh, MD  TURMERIC PO Take by mouth.    [provider]    Family History Family History  Problem Relation Age of Onset  . Breast cancer Cousin        Maternal 1st cousin--Age 39's  . Hypertension Mother   . Diabetes Mother   . Hyperlipidemia Mother   .  Heart disease Mother   . Hypertension Father   . Hyperlipidemia Father   . Heart disease Father   . Heart disease Brother   . Hyperlipidemia Brother     Social History Social History  Substance Use Topics  . Smoking status: Never Smoker  . Smokeless tobacco: Never Used  . Alcohol use 2.4 oz/week    4 Standard drinks or equivalent per week     Comment: wine 3-4 glasses per week     Allergies   Fluticasone-salmeterol and Scallops [shellfish allergy]   Review of Systems Review of Systems  Cardiovascular: Positive for chest pain.  Gastrointestinal: Positive for abdominal pain.  All other systems reviewed and are negative.    Physical Exam Updated Vital Signs BP (!) 142/89   Pulse 89   Temp 98.4 F (36.9 C) (Oral)   Resp (!) 22   Ht  (1.651 m)   Wt 86.2 kg (190 lb)   SpO2 98%   BMI 31.62 kg/m   Physical Exam  Constitutional: She is oriented to person, place, and time. She appears well-developed.  HENT:  Head: Normocephalic.  Mouth/Throat: Oropharynx is clear and moist.  Eyes: Pupils are equal, round, and reactive to light. Conjunctivae and EOM are normal.  Neck: Normal range of motion. Neck supple.  Cardiovascular: Normal rate, regular rhythm and normal heart sounds.   Pulmonary/Chest: Effort normal and breath sounds normal. No respiratory distress. She has no wheezes. She has no rales.  Abdominal: Soft. Bowel sounds are normal.  Mild epigastric and RUQ tenderness. ? Mild murphy's. Mild R CVAT   Musculoskeletal: Normal range of motion. She exhibits no edema.  Neurological: She is alert and oriented to person, place, and time. No cranial nerve deficit. Coordination normal.  Skin: Skin is warm.  Psychiatric: She has a normal mood and affect.  Nursing note and vitals reviewed.    ED Treatments / Results  Labs (all labs ordered are listed, but only abnormal results are displayed) Labs Reviewed  CBC WITH DIFFERENTIAL/PLATELET  COMPREHENSIVE METABOLIC  PANEL  TROPONIN I  LIPASE, BLOOD  URINALYSIS, ROUTINE W REFLEX MICROSCOPIC    EKG  EKG Interpretation  Date/Time:  Friday September 30 2017 18:19:18 EDT Ventricular Rate:  88 PR Interval:  162 QRS Duration: 76 QT Interval:  372 QTC Calculation: 450 R Axis:   48 Text Interpretation:  Normal sinus rhythm Nonspecific ST and T wave abnormality Abnormal ECG No previous ECGs available Confirmed by Richardean Canal [phone]) on 09/30/2017 7:37:07 PM       Radiology No results found.  Procedures Procedures (including critical care time)  Medications Ordered in ED Medications  famotidine (PEPCID) IVPB 20 mg premix (20 mg Intravenous New Bag/Given 09/30/17 1937)  gi cocktail (Maalox,Lidocaine,Donnatal) (30 mLs Oral Given 09/30/17 1936)  sodium chloride 0.9 % bolus 1,000 mL (1,000 mLs Intravenous New Bag/Given 09/30/17 1937)  ondansetron (ZOFRAN) injection 4 mg (4 mg Intravenous Given 09/30/17 1937)     Initial Impression / Assessment and Plan / ED Course  I have reviewed the triage vital signs and the nursing notes.  Pertinent labs & imaging results that were available during my care of the patient were reviewed by me and considered in my medical decision making (see chart for details).    Paula Bradley is a 56 y.o. female here with epigastric, RUQ, R flank pain. Consider biliary colic vs gastritis vs pancreatitis vs renal colic. Less likely ACS and symptoms for 24 hrs so trop x 1 sufficient. Will get labs, lipase, abdominal US, trop x1.   10:15 PM Labs unremarkable. US showed 6 mm stone in the R kidney with no hydro. LFTs nl. I think her chronic epigastric pain is likely from gastritis but she also may have renal colic. She is on nexium already. Will refer to GI for endoscopy, urology for renal colic.   Final Clinical Impressions(s) / ED Diagnoses   Final diagnoses:  None    New Prescriptions New Prescriptions   No medications on file     Charlynne Pander, MD 09/30/17  2217

## 2017-09-30 NOTE — Discharge Instructions (Signed)
Continue taking protonix.   Take tylenol for pain.   Take norco for severe pain. Do NOT drive with it.   You have a kidney stone in your right kidney. Follow up with urologist  Consider following up with GI for endoscopy   Return to ER if you have worse abdominal pain, flank pain, chest pain, vomiting.

## 2017-10-03 ENCOUNTER — Encounter: Payer: Self-pay | Admitting: Gastroenterology

## 2017-10-04 ENCOUNTER — Ambulatory Visit (INDEPENDENT_AMBULATORY_CARE_PROVIDER_SITE_OTHER): Payer: BLUE CROSS/BLUE SHIELD | Admitting: Gynecology

## 2017-10-04 ENCOUNTER — Encounter: Payer: Self-pay | Admitting: Gynecology

## 2017-10-04 VITALS — BP 122/78 | Ht 66.0 in | Wt 193.0 lb

## 2017-10-04 DIAGNOSIS — Z01411 Encounter for gynecological examination (general) (routine) with abnormal findings: Secondary | ICD-10-CM | POA: Diagnosis not present

## 2017-10-04 DIAGNOSIS — N952 Postmenopausal atrophic vaginitis: Secondary | ICD-10-CM | POA: Diagnosis not present

## 2017-10-04 NOTE — Patient Instructions (Signed)
Follow up in one year, sooner as needed. 

## 2017-10-04 NOTE — Progress Notes (Signed)
    Paula Bradley 05-Jul-1961 [id number]        56 y.o.  [id number] for annual gynecologic exam.    Past medical history,surgical history, problem list, medications, allergies, family history and social history were all reviewed and documented as reviewed in the EPIC chart.  ROS:  Performed with pertinent positives and negatives included in the history, assessment and plan.   Additional significant findings :  None   Exam: Paula Bradley assistant Vitals:   10/04/17 1200  BP: 122/78  Weight: 193 lb (87.5 kg)  Height:  (1.676 m)   Body mass index is 31.15 kg/m.  General appearance:  Normal affect, orientation and appearance. Skin: Grossly normal HEENT: Without gross lesions.  No cervical or supraclavicular adenopathy. Thyroid normal.  Lungs:  Clear without wheezing, rales or rhonchi Cardiac: RR, without RMG Abdominal:  Soft, nontender, without masses, guarding, rebound, organomegaly or hernia Breasts:  Examined lying and sitting without masses, retractions, discharge or axillary adenopathy. Pelvic:  Ext, BUS, Vagina: With atrophic changes  Cervix: With atrophic changes  Uterus: Anteverted, normal size, shape and contour, midline and mobile nontender   Adnexa: Without masses or tenderness    Anus and perineum: Normal   Rectovaginal: Normal sphincter tone without palpated masses or tenderness.    Assessment/Plan:  56 y.o. [id number] female for annual gynecologic exam.   1. Postmenopausal/atrophic genital changes. No significant hot flushes, night sweats, vaginal dryness or any vaginal bleeding. Continue to monitor report any issues or bleeding. 2. Mammography 04/2017. Continue with annual mammography when due. Breast exam normal today. 3. Pap smear/HPV 2015. No Pap smear done today. No history of significant abnormal Pap smears. Plan repeat Pap smear at 5 year interval per current screening guidelines. 4. Colonoscopy 2011 with reported repeat interval 10 years. 5. DEXA 2012  normal. Plan repeat DEXA at age 5. 6. Health maintenance. No routine lab work done as patient does this elsewhere. Follow up 1 year, sooner as needed.   Paula Lords MD, 12:18 PM 10/04/2017

## 2017-10-28 ENCOUNTER — Other Ambulatory Visit: Payer: Self-pay | Admitting: Allergy and Immunology

## 2017-11-14 ENCOUNTER — Other Ambulatory Visit: Payer: BLUE CROSS/BLUE SHIELD | Admitting: Internal Medicine

## 2017-11-15 ENCOUNTER — Other Ambulatory Visit: Payer: BLUE CROSS/BLUE SHIELD | Admitting: Internal Medicine

## 2017-11-15 DIAGNOSIS — K219 Gastro-esophageal reflux disease without esophagitis: Secondary | ICD-10-CM

## 2017-11-15 DIAGNOSIS — J309 Allergic rhinitis, unspecified: Secondary | ICD-10-CM

## 2017-11-15 DIAGNOSIS — J45909 Unspecified asthma, uncomplicated: Secondary | ICD-10-CM

## 2017-11-15 DIAGNOSIS — R7302 Impaired glucose tolerance (oral): Secondary | ICD-10-CM

## 2017-11-15 DIAGNOSIS — E785 Hyperlipidemia, unspecified: Secondary | ICD-10-CM

## 2017-11-15 DIAGNOSIS — Z1329 Encounter for screening for other suspected endocrine disorder: Secondary | ICD-10-CM

## 2017-11-15 DIAGNOSIS — F419 Anxiety disorder, unspecified: Secondary | ICD-10-CM

## 2017-11-15 DIAGNOSIS — Z1321 Encounter for screening for nutritional disorder: Secondary | ICD-10-CM

## 2017-11-15 DIAGNOSIS — E669 Obesity, unspecified: Secondary | ICD-10-CM

## 2017-11-15 DIAGNOSIS — Z0000 Encounter for general adult medical examination without abnormal findings: Secondary | ICD-10-CM

## 2017-11-16 LAB — COMPLETE METABOLIC PANEL WITH GFR
AG Ratio: 1.5 (calc) (ref 1.0–2.5)
ALT: 33 U/L — ABNORMAL HIGH (ref 6–29)
AST: 28 U/L (ref 10–35)
Albumin: 4.3 g/dL (ref 3.6–5.1)
Alkaline phosphatase (APISO): 70 U/L (ref 33–130)
BUN: 17 mg/dL (ref 7–25)
CO2: 28 mmol/L (ref 20–32)
Calcium: 9.2 mg/dL (ref 8.6–10.4)
Chloride: 105 mmol/L (ref 98–110)
Creat: 0.7 mg/dL (ref 0.50–1.05)
GFR, Est African American: 111 mL/min/{1.73_m2} (ref >=60)
GFR, Est Non African American: 96 mL/min/{1.73_m2} (ref >=60)
Globulin: 2.9 g/dL (calc) (ref 1.9–3.7)
Glucose, Bld: 105 mg/dL — ABNORMAL HIGH (ref 65–99)
Potassium: 4.4 mmol/L (ref 3.5–5.3)
Sodium: 141 mmol/L (ref 135–146)
Total Bilirubin: 0.5 mg/dL (ref 0.2–1.2)
Total Protein: 7.2 g/dL (ref 6.1–8.1)

## 2017-11-16 LAB — CBC WITH DIFFERENTIAL/PLATELET
Basophils Absolute: 31 cells/uL (ref 0–200)
Basophils Relative: 0.5 %
Eosinophils Absolute: 98 cells/uL (ref 15–500)
Eosinophils Relative: 1.6 %
HCT: 39.9 % (ref 35.0–45.0)
Hemoglobin: 13.7 g/dL (ref 11.7–15.5)
Lymphs Abs: 1147 cells/uL (ref 850–3900)
MCH: 31.1 pg (ref 27.0–33.0)
MCHC: 34.3 g/dL (ref 32.0–36.0)
MCV: 90.5 fL (ref 80.0–100.0)
MPV: 10.5 fL (ref 7.5–12.5)
Monocytes Relative: 10.4 %
Neutro Abs: 4191 cells/uL (ref 1500–7800)
Neutrophils Relative %: 68.7 %
Platelets: 235 10*3/uL (ref 140–400)
RBC: 4.41 10*6/uL (ref 3.80–5.10)
RDW: 12 % (ref 11.0–15.0)
Total Lymphocyte: 18.8 %
WBC mixed population: 634 cells/uL (ref 200–950)
WBC: 6.1 10*3/uL (ref 3.8–10.8)

## 2017-11-16 LAB — HEMOGLOBIN A1C
Hgb A1c MFr Bld: 5.3 % of total Hgb (ref <5.7)
Mean Plasma Glucose: 105 (calc)
eAG (mmol/L): 5.8 (calc)

## 2017-11-16 LAB — LIPID PANEL
Cholesterol: 196 mg/dL (ref <200)
HDL: 80 mg/dL (ref >50)
LDL Cholesterol (Calc): 95 mg/dL (calc)
Non-HDL Cholesterol (Calc): 116 mg/dL (calc) (ref <130)
Total CHOL/HDL Ratio: 2.5 (calc) (ref <5.0)
Triglycerides: 115 mg/dL (ref <150)

## 2017-11-16 LAB — VITAMIN D 25 HYDROXY (VIT D DEFICIENCY, FRACTURES): Vit D, 25-Hydroxy: 42 ng/mL (ref 30–100)

## 2017-11-16 LAB — MICROALBUMIN / CREATININE URINE RATIO
Creatinine, Urine: 85 mg/dL (ref 20–275)
Microalb, Ur: <0.2 mg/dL

## 2017-11-16 LAB — TSH: TSH: 1.62 mIU/L (ref 0.40–4.50)

## 2017-11-22 ENCOUNTER — Encounter: Payer: Self-pay | Admitting: Internal Medicine

## 2017-11-22 ENCOUNTER — Ambulatory Visit: Payer: BLUE CROSS/BLUE SHIELD | Admitting: Internal Medicine

## 2017-11-22 VITALS — BP 120/74 | HR 77 | Temp 98.1°F | Ht 65.0 in | Wt 194.0 lb

## 2017-11-22 DIAGNOSIS — K219 Gastro-esophageal reflux disease without esophagitis: Secondary | ICD-10-CM | POA: Diagnosis not present

## 2017-11-22 DIAGNOSIS — M5416 Radiculopathy, lumbar region: Secondary | ICD-10-CM | POA: Diagnosis not present

## 2017-11-22 DIAGNOSIS — E7849 Other hyperlipidemia: Secondary | ICD-10-CM | POA: Diagnosis not present

## 2017-11-22 DIAGNOSIS — Z6832 Body mass index (BMI) 32.0-32.9, adult: Secondary | ICD-10-CM

## 2017-11-22 DIAGNOSIS — Z8709 Personal history of other diseases of the respiratory system: Secondary | ICD-10-CM | POA: Diagnosis not present

## 2017-11-22 DIAGNOSIS — R7302 Impaired glucose tolerance (oral): Secondary | ICD-10-CM | POA: Diagnosis not present

## 2017-11-22 DIAGNOSIS — F321 Major depressive disorder, single episode, moderate: Secondary | ICD-10-CM | POA: Diagnosis not present

## 2017-11-22 DIAGNOSIS — R1013 Epigastric pain: Secondary | ICD-10-CM

## 2017-11-22 DIAGNOSIS — Z0000 Encounter for general adult medical examination without abnormal findings: Secondary | ICD-10-CM

## 2017-11-22 LAB — POCT URINALYSIS DIPSTICK
Bilirubin, UA: NEGATIVE
Blood, UA: NEGATIVE
Glucose, UA: NEGATIVE
Ketones, UA: NEGATIVE
Leukocytes, UA: NEGATIVE
Nitrite, UA: NEGATIVE
Protein, UA: NEGATIVE
Spec Grav, UA: 1.015 (ref 1.010–1.025)
Urobilinogen, UA: 0.2 E.U./dL
pH, UA: 7 (ref 5.0–8.0)

## 2017-11-22 MED ORDER — CYCLOBENZAPRINE HCL 10 MG PO TABS: 10.0000 mg | ORAL_TABLET | Freq: Three times a day (TID) | ORAL | 0 refills | Status: DC | PRN

## 2017-11-22 MED ORDER — PREDNISONE 10 MG PO TABS: ORAL_TABLET | ORAL | 0 refills | Status: DC
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
## 2017-11-23 NOTE — Patient Instructions (Signed)
Take Flexeril and prednisone and continue use of TENS unit.  Have physical therapy for back pain.  MRI of the LS spine ordered.  Continue other medications as previously prescribed.

## 2017-11-29 ENCOUNTER — Ambulatory Visit: Payer: BLUE CROSS/BLUE SHIELD | Admitting: Gastroenterology

## 2017-11-29 ENCOUNTER — Encounter: Payer: Self-pay | Admitting: Gastroenterology

## 2017-11-29 VITALS — BP 132/78 | HR 72 | Ht 65.0 in | Wt 194.0 lb

## 2017-11-29 DIAGNOSIS — G8929 Other chronic pain: Secondary | ICD-10-CM | POA: Diagnosis not present

## 2017-11-29 DIAGNOSIS — R1013 Epigastric pain: Secondary | ICD-10-CM | POA: Diagnosis not present

## 2017-11-29 DIAGNOSIS — R1011 Right upper quadrant pain: Secondary | ICD-10-CM

## 2017-11-29 NOTE — Patient Instructions (Signed)
If you are age 58 or older, your body mass index should be between 23-30. Your Body mass index is 32.28 kg/m. If this is out of the aforementioned range listed, please consider follow up with your Primary Care Provider.  If you are age 264 or younger, your body mass index should be between 19-25. Your Body mass index is 32.28 kg/m. If this is out of the aformentioned range listed, please consider follow up with your Primary Care Provider.   You have been scheduled for an endoscopy. Please follow written instructions given to you at your visit today. If you use inhalers (even only as needed), please bring them with you on the day of your procedure. Your physician has requested that you go to www.startemmi.com and enter the access code given to you at your visit today. This web site gives a general overview about your procedure. However, you should still follow specific instructions given to you by our office regarding your preparation for the procedure.  Thank you for choosing [facility]  Dr Amada JupiterHenry Danis III

## 2017-11-29 NOTE — Progress Notes (Signed)
[facility] Consult Note:  History: Paula Bradley 11/29/2017  Referring physician: Margaree MackintoshBaxley, Mary J, MD  Reason for consult/chief complaint: Abdominal Pain (Epigastric pain that radiates to back, with nausea)   Subjective  HPI:  This is a 56 year old woman referred by primary care noted above for epigastric pain.  Several years ago she had an episode of acute epigastric pain radiating to the right upper quadrant while she was at the beach.  She said she had negative gallbladder testing at that point.  Since then, she has had occasional "flares", by which she means some vague upper abdominal bloating with "fluttering or palpitations".  Sometimes it will feel distended after meals.  She had another severe episode in October of this year, again radiating to the right side.  She denies nausea, vomiting, early satiety, dysphagia or weight loss.  She has had some stressful events in the last few years with loss of several friends and family members, including just a few days ago.  She has not noted whether stress worsens the symptoms.  On at least a couple occasions it has occurred after heavy meals with alcohol.   ROS:  Review of Systems  Constitutional: Negative for appetite change and unexpected weight change.  HENT: Negative for mouth sores and voice change.   Eyes: Negative for pain and redness.  Respiratory: Positive for cough. Negative for shortness of breath.        Palpitations  Cardiovascular: Negative for chest pain and palpitations.  Genitourinary: Negative for dysuria and hematuria.  Musculoskeletal: Positive for arthralgias and back pain. Negative for myalgias.  Skin: Negative for pallor and rash.  Neurological: Positive for headaches. Negative for weakness.  Hematological: Negative for adenopathy.     Past Medical History: Past Medical History:  Diagnosis Date  . Allergic rhinitis   . Anxiety   . Anxiety and depression   . Asthma   .  Depression   . Hyperlipidemia   . Kidney stone   . Menopause      Past Surgical History: Past Surgical History:  Procedure Laterality Date  . CESAREAN SECTION  1996  . COLPOSCOPY    . DILATION AND CURETTAGE OF UTERUS    . TONSILLECTOMY  1985     Family History: Family History  Problem Relation Age of Onset  . Breast cancer Cousin        Maternal 1st cousin--Age 73's  . Hypertension Mother   . Diabetes Mother   . Hyperlipidemia Mother   . Heart disease Mother   . Hypertension Father   . Hyperlipidemia Father   . Heart disease Father   . Heart disease Brother   . Hyperlipidemia Brother     Social History: Social History   Socioeconomic History  . Marital status: Married    Spouse name: None  . Number of children: 2  . Years of education: None  . Highest education level: None  Social Needs  . Financial resource strain: None  . Food insecurity - worry: None  . Food insecurity - inability: None  . Transportation needs - medical: None  . Transportation needs - non-medical: None  Occupational History  . None  Tobacco Use  . Smoking status: Never Smoker  . Smokeless tobacco: Never Used  Substance and Sexual Activity  . Alcohol use: Yes    Alcohol/week: 2.4 oz    Types: 4 Standard drinks or equivalent per week    Comment: wine 3-4 glasses per week  . Drug use:  No  . Sexual activity: Yes    Birth control/protection: Post-menopausal    Comment: 1st intercourse 67 yo-5 partners  Other Topics Concern  . None  Social History Narrative  . None    Allergies: Allergies  Allergen Reactions  . Fluticasone-Salmeterol     REACTION: hives  . Scallops [Shellfish Allergy] Hives    Outpatient Meds: Current Outpatient Medications  Medication Sig Dispense Refill  . albuterol (PROAIR HFA) 108 (90 Base) MCG/ACT inhaler Inhale 4 puffs into the lungs every 4 (four) hours as needed for wheezing or shortness of breath. 1 Inhaler 2  . beclomethasone (QVAR REDIHALER) 80  MCG/ACT inhaler Inhale two puffs twice daily to prevent cough or wheeze 10.6 g 5  . Biotin 1000 MCG tablet Take 1,000 mcg by mouth 3 (three) times daily.    Jeanie Cooks Serrata (BOSWELLIA PO) Take by mouth daily.    . Bromelains 500 MG TABS Take 1 tablet by mouth 2 (two) times daily.    . Calcium-Magnesium-Vitamin D (CALCIUM 500 PO) Take by mouth.    . cetirizine (ZYRTEC) 10 MG tablet Take 10 mg by mouth daily.    . Cholecalciferol (VITAMIN D) 2000 UNITS tablet Take 2,000 Units by mouth daily.      . clonazePAM (KLONOPIN) 1 MG tablet TAKE 1 TABLET BY MOUTH TWICE DAILY 60 tablet 5  . co-enzyme Q-10 30 MG capsule Take 30 mg by mouth 3 (three) times daily.    . cyclobenzaprine (FLEXERIL) 10 MG tablet Take 1 tablet (10 mg total) by mouth 3 (three) times daily as needed for muscle spasms. 30 tablet 0  . fexofenadine (ALLEGRA) 180 MG tablet Take 180 mg by mouth daily.    Marland Kitchen MAGNESIUM CARBONATE PO Take by mouth.    . Misc Natural Products (TART CHERRY ADVANCED PO) Take by mouth.    . Misc Natural Products (TART CHERRY ADVANCED) CAPS Take 1 capsule by mouth daily.    . montelukast (SINGULAIR) 10 MG tablet Take 1 tablet (10 mg total) by mouth daily. 30 tablet 5  . Multiple Vitamin (MULTIVITAMIN) tablet Take 1 tablet by mouth daily.    . Omega-3 Fatty Acids (FISH OIL PO) Take by mouth.    . pantoprazole (PROTONIX) 40 MG tablet TAKE 1 TABLET BY MOUTH EVERY DAY FOR REFLUX 30 tablet 11  . predniSONE (DELTASONE) 10 MG tablet Take in tapering course as directed 6-5-4-3-2-1 21 tablet 0  . RANITIDINE HCL PO Take 300 mg by mouth at bedtime.     . Saccharomyces boulardii (PROBIOTIC) 250 MG CAPS Take 1 capsule by mouth daily.    . sertraline (ZOLOFT) 50 MG tablet TAKE 1 TABLET(50 MG) BY MOUTH DAILY 90 tablet 0  . simvastatin (ZOCOR) 20 MG tablet TAKE 1 TABLET(20 MG) BY MOUTH AT BEDTIME 90 tablet 3  . TURMERIC PO Take by mouth.    . pantoprazole (PROTONIX) 40 MG tablet TAKE 1 TABLET(40 MG) BY MOUTH DAILY 30 tablet  3   No current facility-administered medications for this visit.       ___________________________________________________________________ Objective   Exam:  BP 132/78   Pulse 72   Ht 5\' 5"  (1.651 m)   Wt 194 lb (88 kg)   BMI 32.28 kg/m    General: this is a(n) well-appearing woman.  Visibly upset at times, as she is on her way to the funeral of a close friend later today.  Eyes: sclera anicteric, no redness  ENT: oral mucosa moist without lesions, no cervical or supraclavicular  lymphadenopathy, good dentition  CV: RRR without murmur, S1/S2, no JVD, no peripheral edema  Resp: clear to auscultation bilaterally, normal RR and effort noted  GI: soft, no tenderness, with active bowel sounds. No guarding or palpable organomegaly noted.  Skin; warm and dry, no rash or jaundice noted  Neuro: awake, alert and oriented x 3. Normal gross motor function and fluent speech  Labs:  CMP Latest Ref Rng & Units 11/15/2017 09/30/2017 05/17/2017  Glucose 65 - 99 mg/dL 098(J105(H) 191(Y140(H) -  BUN 7 - 25 mg/dL 17 8 -  Creatinine 7.820.50 - 1.05 mg/dL 9.560.70 2.130.50 -  Sodium 086135 - 146 mmol/L 141 137 -  Potassium 3.5 - 5.3 mmol/L 4.4 3.1(L) -  Chloride 98 - 110 mmol/L 105 103 -  CO2 20 - 32 mmol/L 28 26 -  Calcium 8.6 - 10.4 mg/dL 9.2 8.9 -  Total Protein 6.1 - 8.1 g/dL 7.2 7.5 6.7  Total Bilirubin 0.2 - 1.2 mg/dL 0.5 0.3 0.6  Alkaline Phos 38 - 126 U/L - 63 61  AST 10 - 35 U/L 28 24 18   ALT 6 - 29 U/L 33(H) 25 20   CBC Latest Ref Rng & Units 11/15/2017 09/30/2017 11/11/2016  WBC 3.8 - 10.8 Thousand/uL 6.1 11.8(H) 5.1  Hemoglobin 11.7 - 15.5 g/dL 57.813.7 46.912.9 62.913.4  Hematocrit 35.0 - 45.0 % 39.9 38.6 40.2  Platelets 140 - 400 Thousand/uL 235 217 229     Radiologic Studies:  US - faty liver, no gallstones, CBD 2mm, maybe right 6mm renal stone 08/2010 colonoscopy by Dr. Juanda ChanceBrodie - no adenomatous polyps  Assessment: Encounter Diagnoses  Name Primary?  . Abdominal pain, chronic, epigastric Yes  .  RUQ pain     These severe episodes, including the one in October, sat as if they could have been biliary colic given her description.  We discussed how this can occur even without stones visible on ultrasound, as it could be gallbladder dyskinesia or stones that are below the level of detection on ultrasound.  The ultrasound also has  decrease sensitivity for stones given her BMI. It is not clear if the more chronic and lesser degree of symptoms is really biliary in nature.  She was also told by her pulmonologist that she has GERD as a trigger for her asthma.  She has never had chronic heartburn or regurgitation, so this diagnosis seems speculative at this point. Plan:  Upper endoscopy.  She is agreeable after thorough discussion of procedure and risks.  The benefits and risks of the planned procedure were described in detail with the patient or (when appropriate) their health care proxy.  Risks were outlined as including, but not limited to, bleeding, infection, perforation, adverse medication reaction leading to cardiac or pulmonary decompensation, or pancreatitis (if ERCP).  The limitation of incomplete mucosal visualization was also discussed.  No guarantees or warranties were given.  Thank you for the courtesy of this consult.  Please call me with any questions or concerns.  Charlie PitterHenry L Danis III  CC: Margaree MackintoshBaxley, Mary J, MD

## 2017-12-01 ENCOUNTER — Other Ambulatory Visit: Payer: Self-pay | Admitting: Allergy and Immunology

## 2017-12-01 ENCOUNTER — Other Ambulatory Visit: Payer: Self-pay | Admitting: Internal Medicine

## 2017-12-01 ENCOUNTER — Other Ambulatory Visit: Payer: Self-pay | Admitting: Allergy & Immunology

## 2017-12-01 IMAGING — US US ABDOMEN COMPLETE
1 series · 13 of 25 positions shown · non-contrast
Comparison: HIDA scan 09/13/2014

CLINICAL DATA: Mid chest pain radiating to the abdomen with nausea

EXAM:
ABDOMEN ULTRASOUND COMPLETE

[Series 1: us abdomen complete · 0.21mm/px · 13 of 69 slices shown]
[im 1/69]
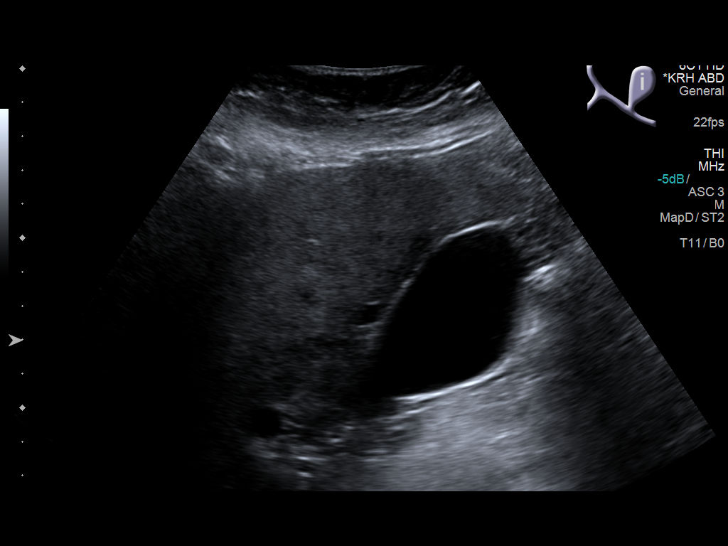
[im 6/69]
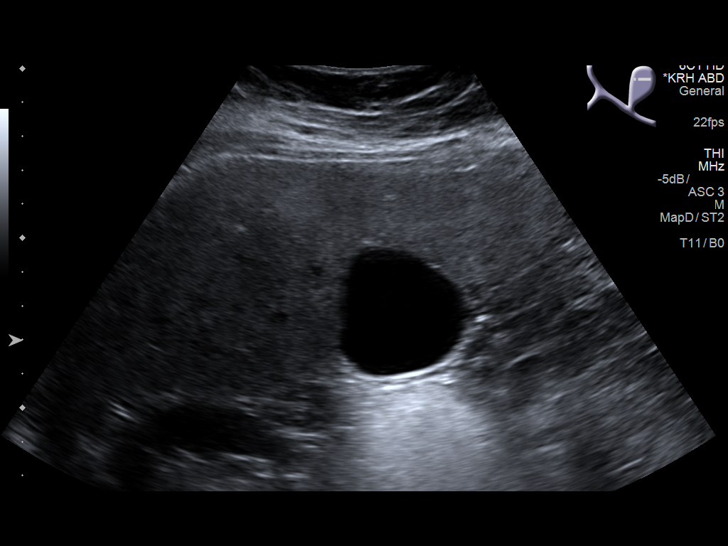
[im 12/69]
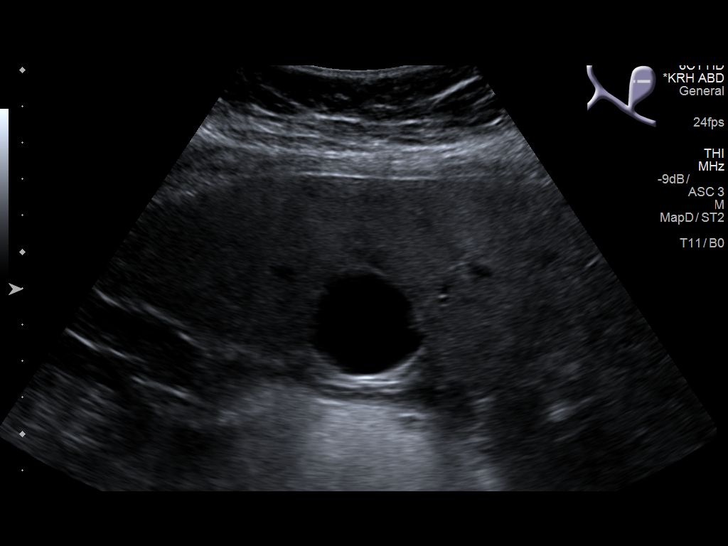
[im 18/69]
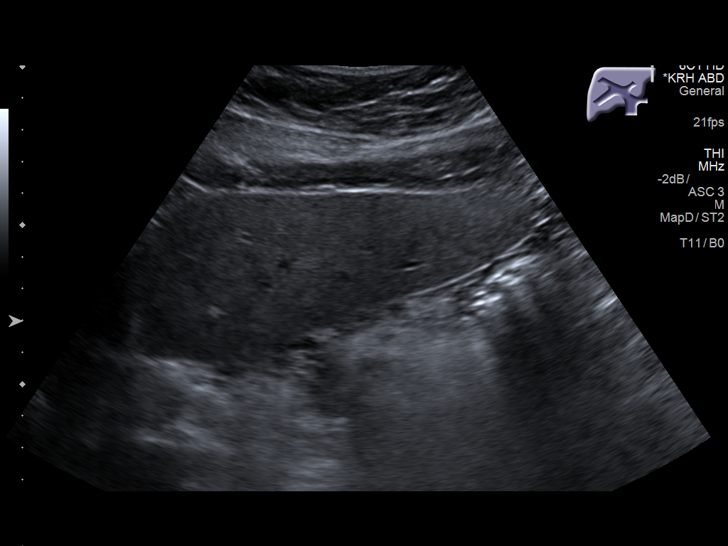
[im 23/69]
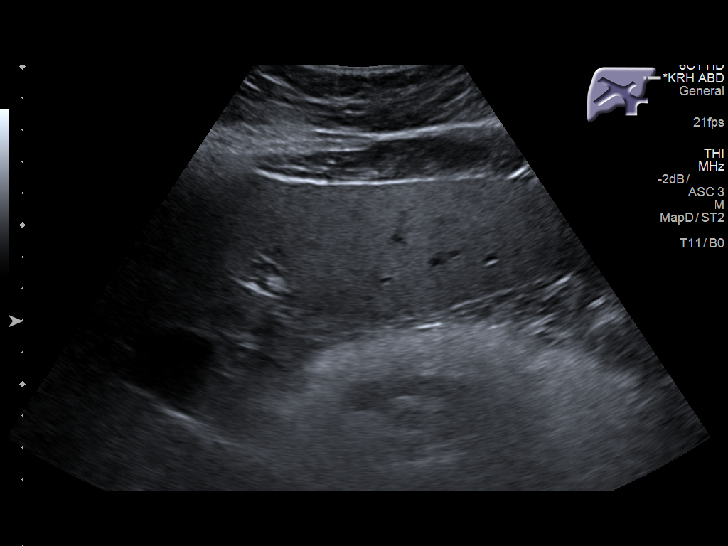
[im 29/69]
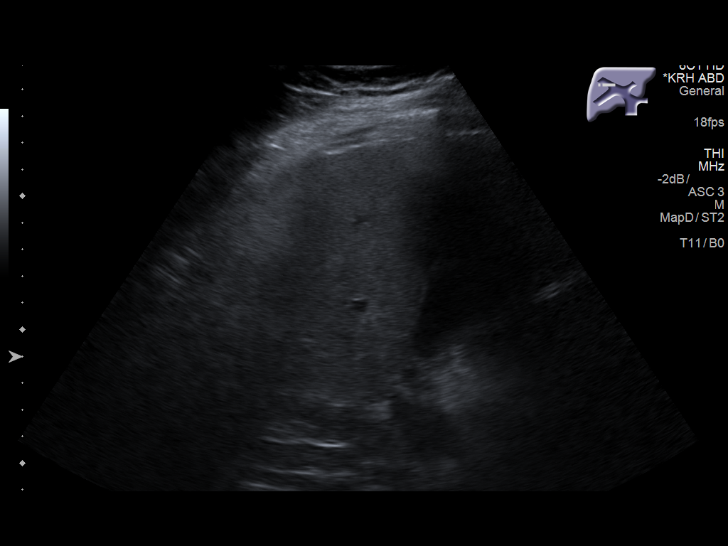
[im 35/69]
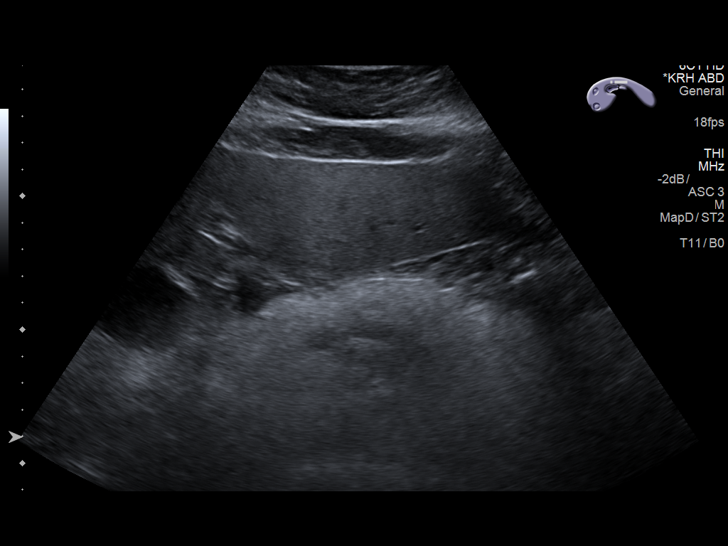
[im 40/69]
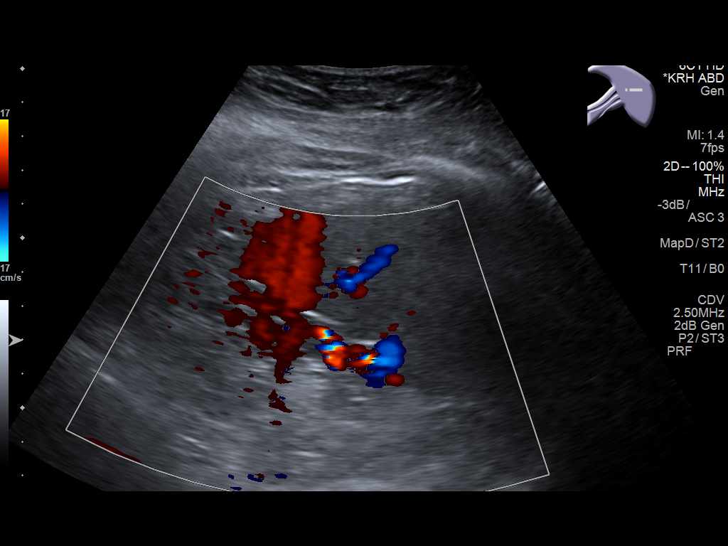
[im 46/69]
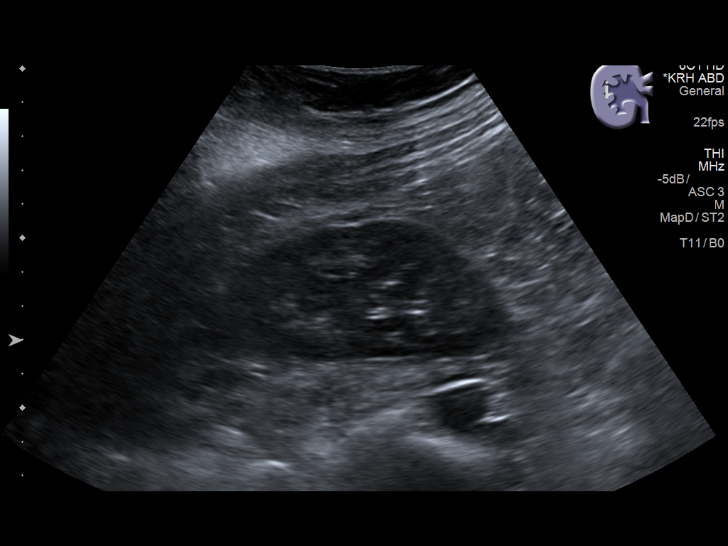
[im 52/69]
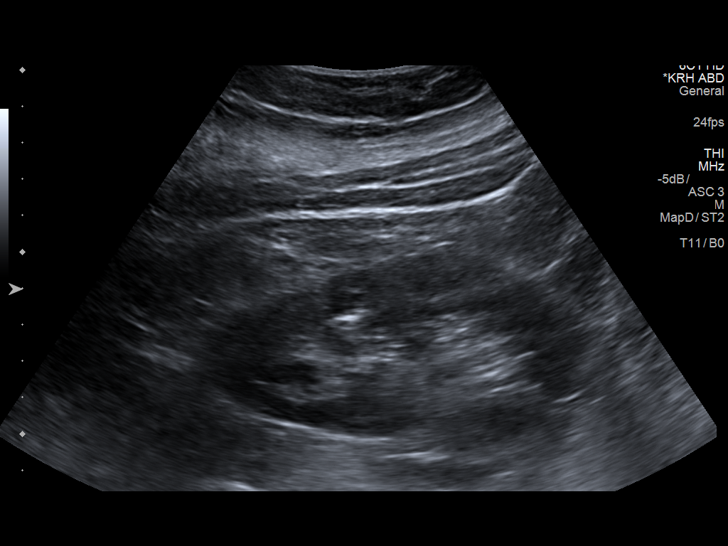
[im 57/69]
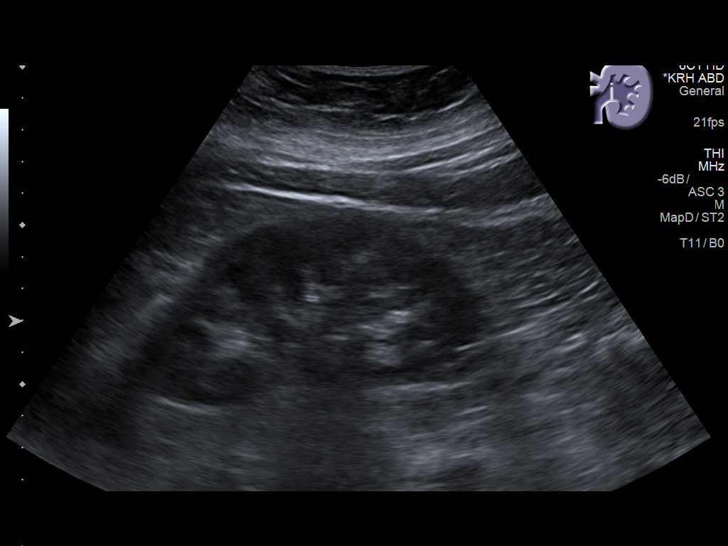
[im 63/69]
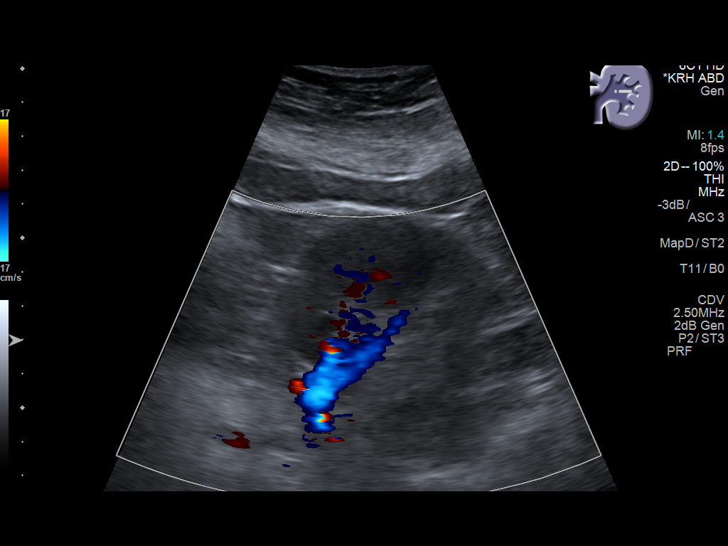
[im 69/69]
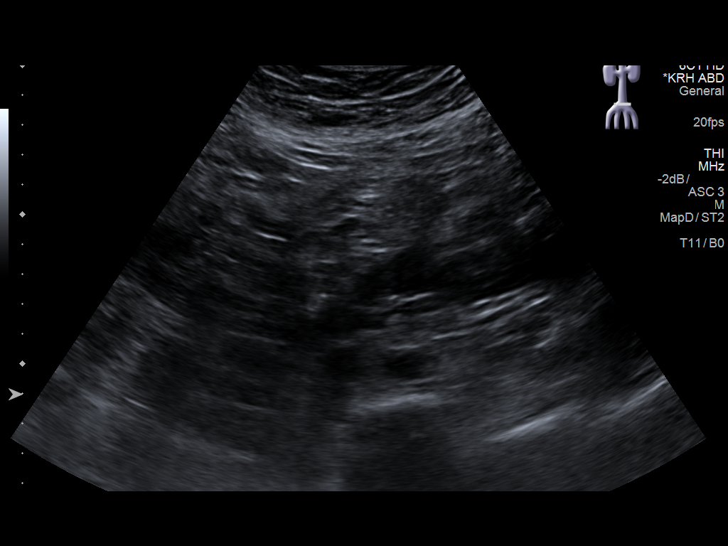

[13 of 25 positions shown; findings below may reference images not displayed]

FINDINGS: Gallbladder: No gallstones or wall thickening visualized. No
sonographic Murphy sign noted by sonographer.

Common bile duct: Diameter: 2 mm

Liver: Increased hepatic echogenicity. Hypoechoic area adjacent to
the gallbladder fossa, possible fatty sparing. Portal vein is patent
on color Doppler imaging with normal direction of blood flow towards
the liver.

IVC: No abnormality visualized.

Pancreas: Echogenic pancreas.  No surrounding fluid.

Spleen: Size and appearance within normal limits.

Right Kidney: Length: 11.4 cm. Echogenicity within normal limits. No
mass or hydronephrosis visualized. Possible 6 mm stone in the mid to
upper pole of the right kidney

Left Kidney: Length: 11.3 cm. Echogenicity within normal limits. No
mass or hydronephrosis visualized.

Abdominal aorta: No aneurysm visualized.

Other findings: None.
IMPRESSION: 1. Negative for gallstones or biliary dilatation
2. Hepatic steatosis with probable fatty sparing near the
gallbladder fossa
3. Possible small stone in the right kidney. Negative for
hydronephrosis

## 2017-12-01 NOTE — Telephone Encounter (Signed)
Verbal order by Dr. Lenord FellersBaxley to refill #60, 1 refill.  [facility] [address]

## 2017-12-02 NOTE — Telephone Encounter (Signed)
Refilled by phone

## 2017-12-06 ENCOUNTER — Encounter: Payer: BLUE CROSS/BLUE SHIELD | Admitting: Gastroenterology

## 2017-12-07 ENCOUNTER — Ambulatory Visit: Payer: BLUE CROSS/BLUE SHIELD | Admitting: Allergy and Immunology

## 2017-12-15 ENCOUNTER — Encounter: Payer: Self-pay | Admitting: Internal Medicine

## 2017-12-15 ENCOUNTER — Ambulatory Visit: Payer: BLUE CROSS/BLUE SHIELD | Admitting: Internal Medicine

## 2017-12-15 VITALS — BP 128/82 | HR 77 | Ht 64.0 in | Wt 193.0 lb

## 2017-12-15 DIAGNOSIS — R1013 Epigastric pain: Secondary | ICD-10-CM | POA: Diagnosis not present

## 2017-12-15 DIAGNOSIS — E7849 Other hyperlipidemia: Secondary | ICD-10-CM

## 2017-12-15 DIAGNOSIS — M5416 Radiculopathy, lumbar region: Secondary | ICD-10-CM

## 2017-12-15 DIAGNOSIS — F439 Reaction to severe stress, unspecified: Secondary | ICD-10-CM | POA: Diagnosis not present

## 2017-12-15 DIAGNOSIS — R7302 Impaired glucose tolerance (oral): Secondary | ICD-10-CM | POA: Diagnosis not present

## 2017-12-15 NOTE — Progress Notes (Signed)
   Subjective:    Patient ID: Paula Bradley, female    DOB: 24-Oct-1961, 56 y.o.   MRN: [id number]  HPI she saw Dr. Myrtie Neitheranis regarding abdominal pain and he feels she indeed could have had biliary colic.  She is going to have an endoscopy in the near future.  She and her husband were closed with a couple and the gentleman died recently of a sudden MI .  They live across the street from each other.  She has been trying to help his wife and has been stressful.  [place] with heavy groceries and items that need to be carried up to the third floor.  She is been going to Pilates and also [facility].  Her back has improved.  I called and had physician peer to peer review with [facility] yesterday and could not get her MRI approved which was scheduled for tomorrow.  Will cancel MRI for tomorrow.  For now with all the stress going on we will defer the MRI of the LS spine.  She will continue to go to physical therapy and Pilates.    Review of Systems still with some soreness in lower back but less radiculopathy.  Clearly anxious with situational stress     Objective:   Physical Exam Straight leg raising is negative at 90 degrees.  Deep tendon reflexes 1+ and symmetrical in her knees.  Muscle strength in the lower extremities is normal.  She is very anxious today.  Seems to have a lot of situational stress.       Assessment & Plan:  Low back pain with bilateral radiculopathy-improving  Situational stress  Plan: She will return in 6 months for 2136-month follow-up on hyperlipidemia and other issues.  In the meantime of her back pain gets much worse she will come in immediately.

## 2017-12-15 NOTE — Patient Instructions (Signed)
If severe back pain recurs, call immediately for an appointment.  Continue Pilates and physical therapy.  Continue Xanax for anxiety.  Follow-up with Dr. Myrtie Neitheranis regarding abdominal pain.  Continue same medications.

## 2017-12-16 ENCOUNTER — Other Ambulatory Visit: Payer: BLUE CROSS/BLUE SHIELD

## 2018-01-01 ENCOUNTER — Emergency Department (HOSPITAL_BASED_OUTPATIENT_CLINIC_OR_DEPARTMENT_OTHER): Payer: BLUE CROSS/BLUE SHIELD

## 2018-01-01 ENCOUNTER — Other Ambulatory Visit: Payer: Self-pay

## 2018-01-01 ENCOUNTER — Emergency Department (HOSPITAL_BASED_OUTPATIENT_CLINIC_OR_DEPARTMENT_OTHER)
Admission: EM | Admit: 2018-01-01 | Discharge: 2018-01-01 | Disposition: A | Discharge status: Home / Self Care | Payer: BLUE CROSS/BLUE SHIELD | Attending: Emergency Medicine | Admitting: Emergency Medicine

## 2018-01-01 ENCOUNTER — Encounter (HOSPITAL_BASED_OUTPATIENT_CLINIC_OR_DEPARTMENT_OTHER): Payer: Self-pay | Admitting: Emergency Medicine

## 2018-01-01 DIAGNOSIS — E785 Hyperlipidemia, unspecified: Secondary | ICD-10-CM | POA: Insufficient documentation

## 2018-01-01 DIAGNOSIS — Z79899 Other long term (current) drug therapy: Secondary | ICD-10-CM | POA: Insufficient documentation

## 2018-01-01 DIAGNOSIS — J4541 Moderate persistent asthma with (acute) exacerbation: Secondary | ICD-10-CM | POA: Diagnosis not present

## 2018-01-01 DIAGNOSIS — R05 Cough: Secondary | ICD-10-CM | POA: Diagnosis not present

## 2018-01-01 DIAGNOSIS — R69 Illness, unspecified: Secondary | ICD-10-CM

## 2018-01-01 DIAGNOSIS — R0602 Shortness of breath: Secondary | ICD-10-CM | POA: Diagnosis present

## 2018-01-01 DIAGNOSIS — R059 Cough, unspecified: Secondary | ICD-10-CM

## 2018-01-01 DIAGNOSIS — J111 Influenza due to unidentified influenza virus with other respiratory manifestations: Secondary | ICD-10-CM

## 2018-01-01 LAB — CBC WITH DIFFERENTIAL/PLATELET
Basophils Absolute: 0 10*3/uL (ref 0.0–0.1)
Basophils Relative: 0 %
Eosinophils Absolute: 0 10*3/uL (ref 0.0–0.7)
Eosinophils Relative: 0 %
HCT: 39.1 % (ref 36.0–46.0)
Hemoglobin: 12.9 g/dL (ref 12.0–15.0)
Lymphocytes Relative: 7 %
Lymphs Abs: 0.5 10*3/uL — ABNORMAL LOW (ref 0.7–4.0)
MCH: 31.2 pg (ref 26.0–34.0)
MCHC: 33 g/dL (ref 30.0–36.0)
MCV: 94.7 fL (ref 78.0–100.0)
Monocytes Absolute: 1 10*3/uL (ref 0.1–1.0)
Monocytes Relative: 14 %
Neutro Abs: 5.3 10*3/uL (ref 1.7–7.7)
Neutrophils Relative %: 79 %
Platelets: 239 10*3/uL (ref 150–400)
RBC: 4.13 MIL/uL (ref 3.87–5.11)
RDW: 12.7 % (ref 11.5–15.5)
WBC: 6.8 10*3/uL (ref 4.0–10.5)

## 2018-01-01 LAB — BASIC METABOLIC PANEL
Anion gap: 9 (ref 5–15)
BUN: 15 mg/dL (ref 6–20)
CO2: 26 mmol/L (ref 22–32)
Calcium: 8.4 mg/dL — ABNORMAL LOW (ref 8.9–10.3)
Chloride: 100 mmol/L — ABNORMAL LOW (ref 101–111)
Creatinine, Ser: 0.82 mg/dL (ref 0.44–1.00)
GFR calc Af Amer: >60 mL/min (ref >60)
GFR calc non Af Amer: >60 mL/min (ref >60)
Glucose, Bld: 102 mg/dL — ABNORMAL HIGH (ref 65–99)
Potassium: 3.7 mmol/L (ref 3.5–5.1)
Sodium: 135 mmol/L (ref 135–145)

## 2018-01-01 LAB — TROPONIN I: Troponin I: <0.03 ng/mL (ref <0.03)

## 2018-01-01 MED ORDER — ACETAMINOPHEN 325 MG PO TABS
650.0000 mg | ORAL_TABLET | Freq: Once | ORAL | Status: DC | PRN
  Filled 2018-01-01: qty 2

## 2018-01-01 MED ORDER — BENZONATATE 100 MG PO CAPS
100.0000 mg | ORAL_CAPSULE | Freq: Three times a day (TID) | ORAL | 0 refills | Status: DC
Start: 2018-01-01 — End: 2018-01-06

## 2018-01-01 MED ORDER — BENZONATATE 100 MG PO CAPS
200.0000 mg | ORAL_CAPSULE | Freq: Once | ORAL | Status: AC
  Administered 2018-01-01: 200 mg via ORAL
  Filled 2018-01-01: qty 2

## 2018-01-01 MED ORDER — ALBUTEROL SULFATE (2.5 MG/3ML) 0.083% IN NEBU: 2.5000 mg | INHALATION_SOLUTION | Freq: Four times a day (QID) | RESPIRATORY_TRACT | 12 refills | Status: DC | PRN

## 2018-01-01 MED ORDER — IPRATROPIUM-ALBUTEROL 0.5-2.5 (3) MG/3ML IN SOLN
3.0000 mL | Freq: Once | RESPIRATORY_TRACT | Status: AC
  Administered 2018-01-01: 3 mL via RESPIRATORY_TRACT
  Filled 2018-01-01: qty 3

## 2018-01-01 MED ORDER — IPRATROPIUM-ALBUTEROL 0.5-2.5 (3) MG/3ML IN SOLN
3.0000 mL | Freq: Four times a day (QID) | RESPIRATORY_TRACT | Status: DC
  Administered 2018-01-01: 3 mL via RESPIRATORY_TRACT

## 2018-01-01 NOTE — ED Provider Notes (Signed)
[facility] EMERGENCY DEPARTMENT Provider Note   CSN: [id number] Arrival date & time: 01/01/18  1126     History   Chief Complaint Chief Complaint  Patient presents with  . Shortness of Breath    HPI Paula Bradley is a 57 y.o. female.  HPI 57 year old Caucasian female past medical history significant for asthma presents to the emergency department today for evaluation of cough, shortness of breath, chest tightness, wheezing.  The patient states that one week ago she went to [place] with her family for vacation.  She states that prior to going to [place] she started feeling under the weather.  She states that while she was in [place] she felt like her asthma was acting up as she was around a fire place.  Patient states that she was seen 4 days ago and urgent care and diagnosed with the flu.  Patient was given Tamiflu has been started take her Tamiflu.  Patient reports that since returning home she feels that she is progressively worsening.  She reports associated wheezing, cough, chest tightness.  Patient states that her inhalers are not helping at home.  She states that her cough is causing her to have some back pain.  Patient reports intermittent fevers.  She also talked to her pulmonologist yesterday who called her in a Z-Pak for possible pneumonia.  Patient has not taken anything for fever prior to arrival today.  She does have a prescription for cough syrup which is not helping her cough.  Patient denies any history of DVT/PE, prolonged immobilization, recent hospitalization/surgeries, unilateral leg swelling or calf tenderness, hormone use, tobacco use.  Patient denies any cardiac history.  Nothing makes her symptoms better or worse.  Patient also states that she has been taking her steroid pack that she had at home for a prior asthma exacerbation and will finish her course of steroids tomorrow.  Pt denies any fever, chill, ha, vision changes, lightheadedness, dizziness,  congestion, neck pain, cp, abd pain, n/v/d, urinary symptoms, change in bowel habits, melena, hematochezia, lower extremity paresthesias.  Past Medical History:  Diagnosis Date  . Allergic rhinitis   . Anxiety   . Anxiety and depression   . Asthma   . Depression   . Hyperlipidemia   . Kidney stone   . Menopause     Patient Active Problem List   Diagnosis Date Noted  . GERD (gastroesophageal reflux disease) 01/21/2015  . Allergic rhinitis 01/21/2015  . Hyperlipidemia 09/22/2014  . Obesity 01/04/2012  . Asthma 11/29/2011  . Anxiety 11/29/2011    Past Surgical History:  Procedure Laterality Date  . CESAREAN SECTION  1996  . COLPOSCOPY    . DILATION AND CURETTAGE OF UTERUS    . TONSILLECTOMY  1985    OB History    Gravida Para Term Preterm AB Living   4 2 2   2 2    SAB TAB Ectopic Multiple Live Births                   Home Medications    Prior to Admission medications   Medication Sig Start Date End Date Taking? Authorizing Provider  albuterol (PROAIR HFA) 108 (90 Base) MCG/ACT inhaler Inhale 4 puffs into the lungs every 4 (four) hours as needed for wheezing or shortness of breath. 08/04/17   Kozlow, Alvira Philips, MD  albuterol (PROVENTIL) (2.5 MG/3ML) 0.083% nebulizer solution Take 3 mLs (2.5 mg total) by nebulization every 6 (six) hours as needed for wheezing or shortness  of breath. 01/01/18   Rise Mu, PA-C  beclomethasone (QVAR REDIHALER) 80 MCG/ACT inhaler Inhale two puffs twice daily to prevent cough or wheeze 05/31/17   Kozlow, Alvira Philips, MD  benzonatate (TESSALON) 100 MG capsule Take 1 capsule (100 mg total) by mouth every 8 (eight) hours. 01/01/18   Rise Mu, PA-C  Biotin 1000 MCG tablet Take 1,000 mcg by mouth 3 (three) times daily.    [provider]  Jeanie Cooks Serrata (BOSWELLIA PO) Take by mouth daily.    [provider]  Bromelains 500 MG TABS Take 1 tablet by mouth 2 (two) times daily.    [provider]    Calcium-Magnesium-Vitamin D (CALCIUM 500 PO) Take by mouth.    [provider]  cetirizine (ZYRTEC) 10 MG tablet Take 10 mg by mouth daily.    [provider]  Cholecalciferol (VITAMIN D) 2000 UNITS tablet Take 2,000 Units by mouth daily.      [provider]  clonazePAM (KLONOPIN) 1 MG tablet TAKE 1 TABLET BY MOUTH TWICE DAILY 12/02/17   Margaree Mackintosh, MD  co-enzyme Q-10 30 MG capsule Take 30 mg by mouth 3 (three) times daily.    [provider]  cyclobenzaprine (FLEXERIL) 10 MG tablet Take 1 tablet (10 mg total) by mouth 3 (three) times daily as needed for muscle spasms. 11/22/17   Margaree Mackintosh, MD  fexofenadine (ALLEGRA) 180 MG tablet Take 180 mg by mouth daily.    [provider]  MAGNESIUM CARBONATE PO Take by mouth.    [provider]  Misc Natural Products (TART CHERRY ADVANCED PO) Take by mouth.    [provider]  Misc Natural Products (TART CHERRY ADVANCED) CAPS Take 1 capsule by mouth daily.    [provider]  montelukast (SINGULAIR) 10 MG tablet TAKE 1 TABLET BY MOUTH DAILY 12/01/17   Alfonse Spruce, MD  Multiple Vitamin (MULTIVITAMIN) tablet Take 1 tablet by mouth daily.    [provider]  Omega-3 Fatty Acids (FISH OIL PO) Take by mouth.    [provider]  pantoprazole (PROTONIX) 40 MG tablet TAKE 1 TABLET(40 MG) BY MOUTH DAILY 10/28/17   Kozlow, Alvira Philips, MD  pantoprazole (PROTONIX) 40 MG tablet TAKE 1 TABLET BY MOUTH EVERY DAY FOR REFLUX 12/01/17   Margaree Mackintosh, MD  QVAR 80 MCG/ACT inhaler INHALE 2 PUFFS INTO THE LUNGS TWICE DAILY TO PREVENT COUGH OR WHEEZE. RINSE, GARGLE AND SPIT AFTER USE 12/01/17   Kozlow, Alvira Philips, MD  RANITIDINE HCL PO Take 300 mg by mouth at bedtime.     [provider]  Saccharomyces boulardii (PROBIOTIC) 250 MG CAPS Take 1 capsule by mouth daily.    [provider]  sertraline (ZOLOFT) 50 MG tablet TAKE 1 TABLET(50 MG) BY MOUTH DAILY 12/01/17    Margaree Mackintosh, MD  simvastatin (ZOCOR) 20 MG tablet TAKE 1 TABLET(20 MG) BY MOUTH AT BEDTIME 08/04/17   Margaree Mackintosh, MD  TURMERIC PO Take by mouth.    [provider]    Family History Family History  Problem Relation Age of Onset  . Breast cancer Cousin        Maternal 1st cousin--Age 86's  . Hypertension Mother   . Diabetes Mother   . Hyperlipidemia Mother   . Heart disease Mother   . Hypertension Father   . Hyperlipidemia Father   . Heart disease Father   . Heart disease Brother   . Hyperlipidemia Brother  Social History Social History   Tobacco Use  . Smoking status: Never Smoker  . Smokeless tobacco: Never Used  Substance Use Topics  . Alcohol use: Yes    Alcohol/week: 2.4 oz    Types: 4 Standard drinks or equivalent per week    Comment: wine 3-4 glasses per week  . Drug use: No     Allergies   Fluticasone-salmeterol and Scallops [shellfish allergy]   Review of Systems Review of Systems  Constitutional: Positive for fever. Negative for chills and diaphoresis.  HENT: Negative for congestion.   Eyes: Negative for visual disturbance.  Respiratory: Positive for cough, chest tightness, shortness of breath and wheezing.   Cardiovascular: Negative for chest pain, palpitations and leg swelling.  Gastrointestinal: Negative for abdominal pain, diarrhea, nausea and vomiting.  Genitourinary: Negative for dysuria, flank pain, frequency, hematuria and urgency.  Musculoskeletal: Positive for myalgias. Negative for arthralgias.  Skin: Negative for rash.  Neurological: Negative for dizziness, syncope, weakness, light-headedness, numbness and headaches.  Psychiatric/Behavioral: Negative for sleep disturbance. The patient is not nervous/anxious.      Physical Exam Updated Vital Signs BP (!) 110/59   Pulse 79   Temp (!) 101.9 F (38.8 C) (Oral)   Resp (!) 21   Ht 5\' 4"  (1.626 m)   Wt 87.5 kg (193 lb)   SpO2 92%   BMI 33.13 kg/m   Physical Exam    Constitutional: She is oriented to person, place, and time. She appears well-developed and well-nourished.  Non-toxic appearance. No distress.  HENT:  Head: Normocephalic and atraumatic.  Nose: Nose normal.  Mouth/Throat: Oropharynx is clear and moist.  Eyes: Conjunctivae are normal. Pupils are equal, round, and reactive to light. Right eye exhibits no discharge. Left eye exhibits no discharge.  Neck: Normal range of motion. Neck supple. No JVD present. No tracheal deviation present.  Cardiovascular: Normal rate, regular rhythm, normal heart sounds and intact distal pulses. Exam reveals no gallop and no friction rub.  No murmur heard. Pulmonary/Chest: Effort normal. No respiratory distress. She has decreased breath sounds. She has wheezes. She has rhonchi. She has no rales. She exhibits tenderness.  No hypoxia or tachypnea.  Rhonchorous breath sounds that clear with cough  Abdominal: Soft. Bowel sounds are normal. She exhibits no distension. There is no tenderness. There is no rebound and no guarding.  Musculoskeletal: Normal range of motion.       Right lower leg: Normal.       Left lower leg: Normal.  No lower extremity edema or calf tenderness.  Lymphadenopathy:    She has no cervical adenopathy.  Neurological: She is alert and oriented to person, place, and time.  Skin: Skin is warm and dry. Capillary refill takes less than 2 seconds. She is not diaphoretic.  Psychiatric: Her behavior is normal. Judgment and thought content normal.  Nursing note and vitals reviewed.    ED Treatments / Results  Labs (all labs ordered are listed, but only abnormal results are displayed) Labs Reviewed  BASIC METABOLIC PANEL - Abnormal; Notable for the following components:      Result Value   Chloride 100 (*)    Glucose, Bld 102 (*)    Calcium 8.4 (*)    All other components within normal limits  CBC WITH DIFFERENTIAL/PLATELET - Abnormal; Notable for the following components:   Lymphs Abs 0.5  (*)    All other components within normal limits  TROPONIN I    EKG  EKG Interpretation  Date/Time:  Sunday  January 01 2018 12:18:59 EST Ventricular Rate:  64 PR Interval:    QRS Duration: 85 QT Interval:  409 QTC Calculation: 422 R Axis:   80 Text Interpretation:  Sinus rhythm Low voltage, precordial leads Abnormal R-wave progression, early transition No acute changes No significant change since last tracing Confirmed by Derwood KaplanNanavati, Ankit [phone]([id number]) on 01/01/2018 1:13:04 PM       Radiology Dg Chest 2 View  Result Date: 01/01/2018 CLINICAL DATA:  Cough, fever, flu EXAM: CHEST  2 VIEW COMPARISON:  09/30/2017 FINDINGS: Lungs are clear.  No pleural effusion or pneumothorax. The heart is normal in size. Visualized osseous structures are within normal limits. IMPRESSION: Normal chest radiographs. Electronically Signed   By: Charline BillsSriyesh  Krishnan M.D.   On: 01/01/2018 12:26    Procedures Procedures (including critical care time)  Medications Ordered in ED Medications  ipratropium-albuterol (DUONEB) 0.5-2.5 (3) MG/3ML nebulizer solution 3 mL (3 mLs Nebulization Given 01/01/18 1221)  benzonatate (TESSALON) capsule 200 mg (200 mg Oral Given 01/01/18 1219)     Initial Impression / Assessment and Plan / ED Course  I have reviewed the triage vital signs and the nursing notes.  Pertinent labs & imaging results that were available during my care of the patient were reviewed by me and considered in my medical decision making (see chart for details).     Patient presents to the emergency department today for evaluation of cough, wheezing, chest tightness, shortness of breath and fever.  Patient was recently diagnosed with influenza 4 days ago.  She was started on Tamiflu.  Patient has also been taking her steroid taper pack from prior illness.  Patient spoke with her pulmonologist yesterday who started her on a Z-Pak for possible pneumonia.  On exam patient is overall well-appearing and nontoxic.  Her  vital signs are very reassuring.  Patient is satting at 96% on room air.  She is febrile which was treated with Tylenol in the ED.  The patient is not tachycardic or hypotensive.  Lungs with expiratory and expiratory wheezes noted.  She does have rhonchorous sounds that improve with cough.  Patient has anterior chest wall tenderness to palpation.  Heart regular rate and rhythm with no rubs murmurs or gallops.  No focal abdominal tenderness to palpation.  No lower extremity edema or calf tenderness.  Lab work is reassuring.  No leukocytosis.  Electrolytes appear at patient's baseline.  Troponin was negative.  EKG shows no ischemic changes or right heart strain.  Patient has normal chest x-ray.  Patient was given a DuoNeb's in the ED.  She was also given Tessalon.  This improved her breath sounds, breathing and cough.  On repeat assessment patient states that she feels very much improved.  Lung sounds improved after breathing treatments.  Patient has been able to ambulate in the ED with saturations above 90%.  Patient has no signs of pneumonia on chest x-ray.  Clinical presentation does not seem consistent with ACS or dissection.  Did speak with patient concerning possible PE workup however this is very low on my differential.  Patient states that she would continue her treatment at home and if she does not improve she will return to the ED for further evaluation.  I feel this is reasonable at this time.  Patient symptoms seem consistent with likely asthma exacerbation caused by viral bronchitis versus influenza.  Patient does not have any nebulizer solution at home for her nebulizer treatment.  Patient will be given nebulizer solution prescription.  I  have encouraged patient to continue taking her azithromycin.  Have also encouraged her to continue taking her steroids.  I will prescribe her Tessalon for cough.  Pt is hemodynamically stable, in NAD, & able to ambulate in the ED. Evaluation does not show  pathology that would require ongoing emergent intervention or inpatient treatment. I explained the diagnosis to the patient. Pain has been managed & has no complaints prior to dc. Pt is comfortable with above plan and is stable for discharge at this time. All questions were answered prior to disposition. Strict return precautions for f/u to the ED were discussed. Encouraged follow up with PCP.  Pt was also seen and eval by my attending Dr. Rhunette Croft who is agreeable with the above plan.   Final Clinical Impressions(s) / ED Diagnoses   Final diagnoses:  Influenza-like illness  Cough  Moderate persistent asthma with exacerbation    ED Discharge Orders        Ordered    albuterol (PROVENTIL) (2.5 MG/3ML) 0.083% nebulizer solution  Every 6 hours PRN     01/01/18 1415    benzonatate (TESSALON) 100 MG capsule  Every 8 hours     01/01/18 1415       Rise Mu, PA-C 01/01/18 2043    Derwood Kaplan, MD 01/03/18 [phone]

## 2018-01-01 NOTE — ED Triage Notes (Signed)
SOB x 1 week. Dx with flu on Thursday. Hx of asthma.

## 2018-01-01 NOTE — Discharge Instructions (Signed)
Your chest x-ray did not show any signs of pneumonia.  Your lab work has been reassuring.  This is likely is a mild exacerbation of your asthma.  Likely caused by your influenza.  Continue taking Motrin and Tylenol at home to help with the fevers.  Have given you nebulizer solution for your nebulizer machine at home.  Take the Tessalon for cough and continue taking the Mucinex for chest congestion.  Continue your course of antibiotics and your steroids.  Follow-up with your primary care doctor this week.  Return to the ED with any worsening symptoms or if you develop worsening cp, sob, cough up blood or for any other reason.

## 2018-01-02 ENCOUNTER — Encounter: Payer: Self-pay | Admitting: Internal Medicine

## 2018-01-02 ENCOUNTER — Telehealth: Payer: Self-pay | Admitting: Internal Medicine

## 2018-01-02 ENCOUNTER — Ambulatory Visit (INDEPENDENT_AMBULATORY_CARE_PROVIDER_SITE_OTHER): Payer: BLUE CROSS/BLUE SHIELD | Admitting: Internal Medicine

## 2018-01-02 VITALS — BP 120/78 | HR 88 | Temp 101.6°F | Ht 64.0 in | Wt 194.0 lb

## 2018-01-02 DIAGNOSIS — R197 Diarrhea, unspecified: Secondary | ICD-10-CM | POA: Diagnosis not present

## 2018-01-02 DIAGNOSIS — Z8709 Personal history of other diseases of the respiratory system: Secondary | ICD-10-CM

## 2018-01-02 DIAGNOSIS — J111 Influenza due to unidentified influenza virus with other respiratory manifestations: Secondary | ICD-10-CM | POA: Diagnosis not present

## 2018-01-02 MED ORDER — ONDANSETRON HCL 4 MG PO TABS: 4.0000 mg | ORAL_TABLET | Freq: Three times a day (TID) | ORAL | 0 refills | Status: DC | PRN

## 2018-01-02 NOTE — Progress Notes (Signed)
   Subjective:    Patient ID: Paula Bradley, female    DOB: 27-Jul-1961, 57 y.o.   MRN: [id number]  HPI  57 year old Female was in [place] on a ski trip trip last week.  Did not feel well after arriving there.  By Thursday, had significant cough, headache, ear pain and myalgias.  Had  fever, sweating and sore throat.  [place]  Had not had flu vaccine.  Temp was 101.9 degrees.  Chest was mostly clear without any wheezing.  No rales were noted.  Had some mild rhonchi.  Was given Tussionex and Tamiflu.  Note reviewed from Dr. Laddie AquasSpeakman there.  No note about any flu test.  I think was diagnosed based on symptoms.  She called her allergist, who gave her a Z-Pak on Saturday, July 5.  She was seen in the emergency department at [facility] on January 6.  She also had a prednisone Dosepak which she  started last week.  She says she was wheezing out later.  She has Qvar and albuterol inhalers.  Complaining bitterly of myalgias fatigue and diarrhea.  Says she has had multiple episodes of diarrhea.  Says she thinks she is dehydrated but received IV fluids yesterday in the emergency department.  At that time BUN was 15 and creatinine was 0.82.  Sodium and potassium were normal.  CBC was normal.  She did have a chest x-ray yesterday in the emergency department which was normal.    Review of Systems see above     Objective:   Physical Exam She looks fatigued and is slow to move.  Pharynx is clear.  TMs are clear.  Neck is supple without significant adenopathy.  Chest clear to auscultation without rales or wheezing.       Assessment & Plan:  Probable influenza  Bronchitis  Diarrhea  Plan: She has to ride it out at this point in time.  I doubt she started Tamiflu in time.  She is medically stable and her lungs are clear.  She has been using her albuterol too frequently.  She may stay on clear liquids.  Was given Zofran for nausea.  She is to take Imodium for  diarrhea.  She has a high anxiety level and needs to take her antianxiety medication.  This can be several days before she is feeling better.  Over 30 minutes spent with patient today including telephone call 6 she made prior to her office visit today

## 2018-01-02 NOTE — Telephone Encounter (Signed)
Patient is doing the Nebulizer every 3 hours.  She did 4 last night.  She has a machine at home.  Told her she has to stop doing it that much.  The instructions were every 6 hours.    She was not given Prednisone; however, she had a Rx from you for her back that she had not used.  She started on that Tuesday and has used it.  She has diarrhea really bad; said it's severe and she is dizzy.  She is nauseous as well.   Advised to just do clear liquids.  Force liquids to thin the secretions so she'll be able to get the phlegm up.    Spoke with Dr. Lenord FellersBaxley.  She advised patient can take Immodium and clear liquids.  Patient was provided an appointment for this afternoon at 3:00.  Called patient back @ [phone] and patient confirmed.

## 2018-01-03 ENCOUNTER — Encounter: Payer: Self-pay | Admitting: Gastroenterology

## 2018-01-03 ENCOUNTER — Telehealth: Payer: Self-pay | Admitting: Internal Medicine

## 2018-01-03 NOTE — Telephone Encounter (Signed)
Pt was notified of results and instructions, pt verbalized understanding.   

## 2018-01-03 NOTE — Patient Instructions (Signed)
Rest and drink plenty of fluids.  Take Zofran for nausea.  Imodium for diarrhea.  He cut back on use of albuterol and use as directed only.

## 2018-01-03 NOTE — Telephone Encounter (Signed)
I do not want her to have anything else. Please have her take some Xanax for anxiety.

## 2018-01-03 NOTE — Telephone Encounter (Signed)
Patient wants to know if there's anything stronger you can give her for the cough.  She has Hycodan syrup.    Thank you.    Best # for contact:  [phone]

## 2018-01-04 ENCOUNTER — Emergency Department (HOSPITAL_COMMUNITY): Payer: BLUE CROSS/BLUE SHIELD

## 2018-01-04 ENCOUNTER — Other Ambulatory Visit: Payer: Self-pay

## 2018-01-04 ENCOUNTER — Encounter: Payer: Self-pay | Admitting: Internal Medicine

## 2018-01-04 ENCOUNTER — Encounter (HOSPITAL_COMMUNITY): Payer: Self-pay

## 2018-01-04 ENCOUNTER — Emergency Department (HOSPITAL_COMMUNITY)
Admission: EM | Admit: 2018-01-04 | Discharge: 2018-01-04 | Disposition: A | Discharge status: Home / Self Care | Payer: BLUE CROSS/BLUE SHIELD | Attending: Emergency Medicine | Admitting: Emergency Medicine

## 2018-01-04 ENCOUNTER — Telehealth: Payer: Self-pay | Admitting: Internal Medicine

## 2018-01-04 ENCOUNTER — Other Ambulatory Visit: Payer: Self-pay | Admitting: Internal Medicine

## 2018-01-04 DIAGNOSIS — R05 Cough: Secondary | ICD-10-CM | POA: Diagnosis not present

## 2018-01-04 DIAGNOSIS — Z5321 Procedure and treatment not carried out due to patient leaving prior to being seen by health care provider: Secondary | ICD-10-CM | POA: Diagnosis not present

## 2018-01-04 DIAGNOSIS — J189 Pneumonia, unspecified organism: Secondary | ICD-10-CM

## 2018-01-04 MED ORDER — DOXYCYCLINE HYCLATE 100 MG PO TABS: 100.0000 mg | ORAL_TABLET | Freq: Two times a day (BID) | ORAL | 0 refills | Status: DC

## 2018-01-04 NOTE — ED Triage Notes (Signed)
Pt states that she has been sick the past week, diagnosed with Flu on Thursday. Unable to control cough and asthma, breath sound clear. NAD

## 2018-01-04 NOTE — ED Notes (Signed)
Pt stated that she had to go home and get kids ready for school

## 2018-01-04 NOTE — Telephone Encounter (Signed)
Patient went to [facility] shortly after 3 AM today because she was feeling badly.  She was actually having some significant anxiety over how bad she felt.  She waited about 3 hours.  A chest x-ray was done which was suspicious for pneumonia.  However she left before being seen by physician shortly after 6 this morning.  I called her when I saw the note from the emergency department.  She says that she has never been so sick in her life and she is quite anxious and has not been thinking clearly.  Advised her to take her Xanax for anxiety.  She has finished her Z-Pak.  I offered Levaquin but she does not want to take Levaquin.  We will put her on doxycycline and I will see her tomorrow in follow-up.  She did not have blood work done in the emergency department.  We will get lab work done tomorrow.  She is to rest inside today and take it easy and hydrate herself.

## 2018-01-05 ENCOUNTER — Encounter: Payer: Self-pay | Admitting: Internal Medicine

## 2018-01-05 ENCOUNTER — Ambulatory Visit (INDEPENDENT_AMBULATORY_CARE_PROVIDER_SITE_OTHER): Payer: BLUE CROSS/BLUE SHIELD | Admitting: Internal Medicine

## 2018-01-05 VITALS — BP 110/70 | HR 88 | Temp 101.6°F | Wt 191.0 lb

## 2018-01-05 DIAGNOSIS — F419 Anxiety disorder, unspecified: Secondary | ICD-10-CM

## 2018-01-05 DIAGNOSIS — J189 Pneumonia, unspecified organism: Secondary | ICD-10-CM | POA: Diagnosis not present

## 2018-01-05 DIAGNOSIS — Z8709 Personal history of other diseases of the respiratory system: Secondary | ICD-10-CM

## 2018-01-05 LAB — COMPLETE METABOLIC PANEL WITH GFR
AG Ratio: 1.2 (calc) (ref 1.0–2.5)
ALT: 48 U/L — ABNORMAL HIGH (ref 6–29)
AST: 74 U/L — ABNORMAL HIGH (ref 10–35)
Albumin: 3.7 g/dL (ref 3.6–5.1)
Alkaline phosphatase (APISO): 54 U/L (ref 33–130)
BUN: 8 mg/dL (ref 7–25)
CO2: 29 mmol/L (ref 20–32)
Calcium: 9 mg/dL (ref 8.6–10.4)
Chloride: 89 mmol/L — ABNORMAL LOW (ref 98–110)
Creat: 0.55 mg/dL (ref 0.50–1.05)
GFR, Est African American: 120 mL/min/{1.73_m2} (ref >=60)
GFR, Est Non African American: 103 mL/min/{1.73_m2} (ref >=60)
Globulin: 3.1 g/dL (calc) (ref 1.9–3.7)
Glucose, Bld: 116 mg/dL — ABNORMAL HIGH (ref 65–99)
Potassium: 3.7 mmol/L (ref 3.5–5.3)
Sodium: 127 mmol/L — ABNORMAL LOW (ref 135–146)
Total Bilirubin: 0.5 mg/dL (ref 0.2–1.2)
Total Protein: 6.8 g/dL (ref 6.1–8.1)

## 2018-01-05 LAB — CBC WITH DIFFERENTIAL/PLATELET
Basophils Absolute: 11 cells/uL (ref 0–200)
Basophils Relative: 0.2 %
Eosinophils Absolute: 0 cells/uL — ABNORMAL LOW (ref 15–500)
Eosinophils Relative: 0 %
HCT: 38.4 % (ref 35.0–45.0)
Hemoglobin: 13.5 g/dL (ref 11.7–15.5)
Lymphs Abs: 806 cells/uL — ABNORMAL LOW (ref 850–3900)
MCH: 30.6 pg (ref 27.0–33.0)
MCHC: 35.2 g/dL (ref 32.0–36.0)
MCV: 87.1 fL (ref 80.0–100.0)
MPV: 10.1 fL (ref 7.5–12.5)
Monocytes Relative: 5.9 %
Neutro Abs: 4452 cells/uL (ref 1500–7800)
Neutrophils Relative %: 79.5 %
Platelets: 245 10*3/uL (ref 140–400)
RBC: 4.41 10*6/uL (ref 3.80–5.10)
RDW: 12.3 % (ref 11.0–15.0)
Total Lymphocyte: 14.4 %
WBC mixed population: 330 cells/uL (ref 200–950)
WBC: 5.6 10*3/uL (ref 3.8–10.8)

## 2018-01-05 MED ORDER — CEFTRIAXONE SODIUM 1 G IJ SOLR
1.0000 g | Freq: Once | INTRAMUSCULAR | Status: AC
  Administered 2018-01-05: 1 g via INTRAMUSCULAR

## 2018-01-05 MED ORDER — HYDROCOD POLST-CPM POLST ER 10-8 MG/5ML PO SUER: 5.0000 mL | Freq: Two times a day (BID) | ORAL | 0 refills | Status: DC

## 2018-01-05 MED ORDER — PREDNISONE 10 MG PO TABS: ORAL_TABLET | ORAL | 0 refills | Status: DC

## 2018-01-06 ENCOUNTER — Other Ambulatory Visit: Payer: Self-pay | Admitting: Internal Medicine

## 2018-01-06 ENCOUNTER — Encounter: Payer: Self-pay | Admitting: Internal Medicine

## 2018-01-06 ENCOUNTER — Ambulatory Visit (INDEPENDENT_AMBULATORY_CARE_PROVIDER_SITE_OTHER): Payer: BLUE CROSS/BLUE SHIELD | Admitting: Internal Medicine

## 2018-01-06 DIAGNOSIS — J189 Pneumonia, unspecified organism: Secondary | ICD-10-CM | POA: Diagnosis not present

## 2018-01-06 MED ORDER — BENZONATATE 100 MG PO CAPS: 100.0000 mg | ORAL_CAPSULE | Freq: Two times a day (BID) | ORAL | 0 refills | Status: DC | PRN

## 2018-01-06 MED ORDER — ALBUTEROL SULFATE (2.5 MG/3ML) 0.083% IN NEBU: 2.5000 mg | INHALATION_SOLUTION | Freq: Four times a day (QID) | RESPIRATORY_TRACT | 12 refills | Status: DC | PRN

## 2018-01-06 MED ORDER — CEFTRIAXONE SODIUM 1 G IJ SOLR
1.0000 g | Freq: Once | INTRAMUSCULAR | Status: AC
  Administered 2018-01-06: 1 g via INTRAMUSCULAR

## 2018-01-06 NOTE — Progress Notes (Signed)
   Subjective:    Patient ID: Paula Bradley, female    DOB: 10/09/1961, 57 y.o.   MRN: [id number]  HPI Here with her husband.  Follow-up on pneumonia.  1 g IM Rocephin given.  Says she was able to get some rest with Tussionex.  We went over instructions in detail with both of them regarding nebulizer solution, use of Qvar, prednisone, antibiotic, cough preparations.  Respiratory panel sent along with urine for Legionella DFA.  Husband says that they went into a steam room but she only went once and he went several times.  He says he developed a sinus infection.  She still coughing.  She is afebrile today but says she took ibuprofen.  Blood pressure stable at 110/80 and pulse oximetry is 98%.  She looks less distressed today but is somewhat tearful in the office.  Explained to her that I was concerned about her level of hydration and not eating well.  Talked with her about increasing fluid intake.  Says nothing seems to taste good and she has no appetite.   Review of Systems see above-chest x-ray January 9 showed perihilar and peripheral lung opacities suspicious for multifocal pneumonia.  No evidence of congestive heart failure or pleural effusion.     Objective:   Physical Exam Skin warm and dry.  TMs and pharynx are clear.  Neck is supple.  Chest sounds more clear than yesterday with less rales.       Assessment & Plan:  Bilateral pneumonia  History of asthma  Hyponatremia  Plan: Somewhat concerned regarding possible Legionella.  Have sent urine for Legionella DFA.  Mild elevation of liver functions also noted which would be consistent with that.  She is on doxycycline.  Given another 1 g IM Rocephin and today.  Rest and drink plenty of fluids and follow-up on Monday with at least a telephone call.  If she gets worse over the weekend she may need hospitalization.  This was related clearly to patient and her husband.  Continue doxycycline, albuterol treatment, Tussionex for  cough.  She requested Tessalon Perles which was called in as well along with a refill for albuterol nebulizer solution.  25 minutes spent with patient and her husband

## 2018-01-06 NOTE — Patient Instructions (Signed)
Tessalon Perles as needed for cough in between doses of Tussionex.  Rest and drink plenty of fluids.  Try to eat some food.  Continue albuterol nebulizer treatments 4 times a day.  Continue doxycycline.  Rocephin 1 g IM given today.  Respiratory panel sent along with urine for Legionella DFA.

## 2018-01-09 ENCOUNTER — Encounter: Payer: BLUE CROSS/BLUE SHIELD | Admitting: Gastroenterology

## 2018-01-10 ENCOUNTER — Ambulatory Visit (INDEPENDENT_AMBULATORY_CARE_PROVIDER_SITE_OTHER): Payer: BLUE CROSS/BLUE SHIELD | Admitting: Internal Medicine

## 2018-01-10 VITALS — BP 120/60 | HR 98 | Temp 98.7°F | Ht 64.0 in | Wt 188.0 lb

## 2018-01-10 DIAGNOSIS — E871 Hypo-osmolality and hyponatremia: Secondary | ICD-10-CM | POA: Diagnosis not present

## 2018-01-10 DIAGNOSIS — J189 Pneumonia, unspecified organism: Secondary | ICD-10-CM | POA: Diagnosis not present

## 2018-01-10 DIAGNOSIS — Z8709 Personal history of other diseases of the respiratory system: Secondary | ICD-10-CM

## 2018-01-10 LAB — RESPIRATORY VIRUS PANEL

## 2018-01-10 LAB — CBC WITH DIFFERENTIAL/PLATELET
Basophils Absolute: 14 cells/uL (ref 0–200)
Basophils Relative: 0.1 %
Eosinophils Absolute: 41 cells/uL (ref 15–500)
Eosinophils Relative: 0.3 %
HCT: 36.3 % (ref 35.0–45.0)
Hemoglobin: 12.4 g/dL (ref 11.7–15.5)
Lymphs Abs: 635 cells/uL — ABNORMAL LOW (ref 850–3900)
MCH: 30.5 pg (ref 27.0–33.0)
MCHC: 34.2 g/dL (ref 32.0–36.0)
MCV: 89.4 fL (ref 80.0–100.0)
MPV: 9.3 fL (ref 7.5–12.5)
Monocytes Relative: 7.9 %
Neutro Abs: 11745 cells/uL — ABNORMAL HIGH (ref 1500–7800)
Neutrophils Relative %: 87 %
Platelets: 405 10*3/uL — ABNORMAL HIGH (ref 140–400)
RBC: 4.06 10*6/uL (ref 3.80–5.10)
RDW: 12 % (ref 11.0–15.0)
Total Lymphocyte: 4.7 %
WBC mixed population: 1067 cells/uL — ABNORMAL HIGH (ref 200–950)
WBC: 13.5 10*3/uL — ABNORMAL HIGH (ref 3.8–10.8)

## 2018-01-10 LAB — COMPLETE METABOLIC PANEL WITH GFR
AG Ratio: 1.2 (calc) (ref 1.0–2.5)
ALT: 61 U/L — ABNORMAL HIGH (ref 6–29)
AST: 24 U/L (ref 10–35)
Albumin: 3.4 g/dL — ABNORMAL LOW (ref 3.6–5.1)
Alkaline phosphatase (APISO): 55 U/L (ref 33–130)
BUN: 11 mg/dL (ref 7–25)
CO2: 31 mmol/L (ref 20–32)
Calcium: 9.2 mg/dL (ref 8.6–10.4)
Chloride: 100 mmol/L (ref 98–110)
Creat: 0.61 mg/dL (ref 0.50–1.05)
GFR, Est African American: 116 mL/min/{1.73_m2} (ref >=60)
GFR, Est Non African American: 100 mL/min/{1.73_m2} (ref >=60)
Globulin: 2.9 g/dL (calc) (ref 1.9–3.7)
Glucose, Bld: 116 mg/dL — ABNORMAL HIGH (ref 65–99)
Potassium: 4.2 mmol/L (ref 3.5–5.3)
Sodium: 139 mmol/L (ref 135–146)
Total Bilirubin: 0.6 mg/dL (ref 0.2–1.2)
Total Protein: 6.3 g/dL (ref 6.1–8.1)

## 2018-01-10 MED ORDER — DOXYCYCLINE HYCLATE 100 MG PO TABS: 100.0000 mg | ORAL_TABLET | Freq: Two times a day (BID) | ORAL | 0 refills | Status: DC

## 2018-01-10 MED ORDER — CLONAZEPAM 1 MG PO TABS: 1.0000 mg | ORAL_TABLET | Freq: Two times a day (BID) | ORAL | 5 refills | Status: DC

## 2018-01-11 ENCOUNTER — Telehealth: Payer: Self-pay | Admitting: Allergy and Immunology

## 2018-01-11 ENCOUNTER — Encounter: Payer: Self-pay | Admitting: Internal Medicine

## 2018-01-11 LAB — LEGIONELLA ANTIGEN, URINE
Legionella Antigen, Urine: NOT DETECTED
MICRO NUMBER:: 90053642
SPECIMEN QUALITY:: ADEQUATE

## 2018-01-11 NOTE — Telephone Encounter (Signed)
Please advise 

## 2018-01-11 NOTE — Progress Notes (Signed)
   Subjective:    Patient ID: Paula Bradley, female    DOB: 04/07/1961, 57 y.o.   MRN: [id number]  HPI She is here today to follow-up on pneumonia.  She is accompanied by her husband.  She looks better.  We are going to check labs today.  Last visit she had hyponatremia and I thought she was volume depleted.  She says she is been eating and drinking much better and he confirms that.  Respiratory virus panel was negative.  We have called the lab about  Legionella DFA by urine and they report they cannot find the results but it is clearly ordered and was sent.  She continues with narcotic cough medication.  Needs to clarify when to take Tessalon and we did that today.  She may take Tessalon in between doses of Tussionex.    Review of Systems     Objective:   Physical Exam Her chest is clear today.  No rales appreciated.  Skin is warm and dry.  Nodes none.  She is ambulatory.  She is a bit pale.  Her white count is elevated but I think that is due to her being on steroids.  BUN and creatinine are normal.  Albumin is slightly low at 3.4.  She has mild elevation of SGPT at 61 and previously was 48.  SGOT previously was 74 and is now normal.  Sodium is now normal at 139 and previously was 127.  Potassium was 3.7 and is now 4.2  Was diagnosed with perihilar and peripheral lung opacities on January 9.  This was [facility] she did not stay for evaluation.     Assessment & Plan:    Perihilar and bilateral pneumonia.  Plan: Continue current treatment and follow-up in 1 week.  25 minutes spent with patient and her husband.

## 2018-01-11 NOTE — Telephone Encounter (Signed)
Can she come in to be seen tomorrow in a sick visit slot?

## 2018-01-11 NOTE — Telephone Encounter (Signed)
I spoke with the patient and I have scheduled her for tomorrow with Dr. Delorse LekPadgett at 1120am. I also attached a mask to the office note for tomorrow.

## 2018-01-11 NOTE — Telephone Encounter (Signed)
Patient is requesting advice on a cough she cannot get rid of. She has had pneumonia and has been treated by Dr. Lenord FellersBaxley; was put on antibiotic. She does not believe it is working and would like to know if we can help her. Last seen 09-19-17.

## 2018-01-11 NOTE — Patient Instructions (Signed)
Continue current treatment and follow-up in 1 week.  Drink plenty of fluids and rest.

## 2018-01-12 ENCOUNTER — Other Ambulatory Visit: Payer: Self-pay | Admitting: Allergy & Immunology

## 2018-01-12 ENCOUNTER — Ambulatory Visit: Payer: BLUE CROSS/BLUE SHIELD | Admitting: Allergy

## 2018-01-12 ENCOUNTER — Encounter: Payer: Self-pay | Admitting: Allergy

## 2018-01-12 VITALS — BP 130/78 | HR 88 | Temp 98.1°F | Resp 20

## 2018-01-12 DIAGNOSIS — J4541 Moderate persistent asthma with (acute) exacerbation: Secondary | ICD-10-CM

## 2018-01-12 DIAGNOSIS — K219 Gastro-esophageal reflux disease without esophagitis: Secondary | ICD-10-CM | POA: Diagnosis not present

## 2018-01-12 DIAGNOSIS — J3089 Other allergic rhinitis: Secondary | ICD-10-CM | POA: Diagnosis not present

## 2018-01-12 MED ORDER — LEVALBUTEROL TARTRATE 45 MCG/ACT IN AERO: 2.0000 | INHALATION_SPRAY | Freq: Four times a day (QID) | RESPIRATORY_TRACT | 1 refills | Status: DC | PRN

## 2018-01-12 MED ORDER — LEVALBUTEROL HCL 1.25 MG/3ML IN NEBU: 1.2500 mg | INHALATION_SOLUTION | Freq: Four times a day (QID) | RESPIRATORY_TRACT | 1 refills | Status: DC | PRN

## 2018-01-12 NOTE — Progress Notes (Signed)
Thank you :)

## 2018-01-12 NOTE — Progress Notes (Signed)
Follow-up Note  RE: Mackenzi Krogh MRN: [id number] DOB: [birth year] Date of Office Visit: 01/12/2018   History of present illness: Paula Bradley is a 57 y.o. female presenting today for sick visit.  She was last seen in the office by Dr. Lucie Leather on September 14, 2017 for follow-up of asthma at which time she did have an acute exacerbation.  Since this visit she states that she started to get sick around December 29 when she was visiting [place].  She states she did not feel well upon traveling and the temperatures they were in the negative degrees.  She developed symptoms of cough and difficulty breathing and was seen in urgent care there and tested positive for flu and was prescribed Tamiflu.  She states she completed this and she came home however she was not feeling any better and complained that she was having trouble breathing which would lead to her feeling panicked.  She went to an urgent care and received breathing treatments.   she continued to have trouble breathing and that she went to the ED on 01/01/2018 she did have a chest x-ray done that was that was unremarkable.  In the ED she was given DuoNeb as well as Lawyer.  She was discharged to complete her Tamiflu.  She states at some point about 2 weeks ago that she also called the on-call doctor who prescribed her a Z-Pak which she completed.  She also in the course of her illness was prescribed Tussionex.  She states she did have some prednisone that she had leftover from previous back pain problem and she did take she believes 4-5 days of prednisone but is unsure what the dosing was.  She went back to the ED on 01/04/2018 for continued respiratory symptoms and had another chest x-ray done that did show evidence of patchy infiltrate concerning for multifocal pneumonia.  She states she did not stay at this ED visit.  She was seen by her PCP who prescribed her doxycycline and she has 1 day left of this.  She does feel better than she  did when she first became ill but she is still having difficulty breathing and at times feels like she not going to be able to breathe and then she panics and has been very anxious due to this.  She states she has been taking her antianxiety medications.  She is needing to use her albuterol every 6 hours but this makes her even more jittery and anxious and in more of a panic state.  Because of the difficulty breathing she states she has been having problems with taking her Qvar as she feels unable to get a deep breath out in order to inhaled the Qvar.  She does continue to take Singulair.  Review of systems: Review of Systems  Constitutional: Positive for fever and malaise/fatigue.  HENT: Positive for congestion. Negative for ear discharge, ear pain, nosebleeds, sinus pain, sore throat and tinnitus.   Eyes: Negative for pain, discharge and redness.  Respiratory: Positive for cough, shortness of breath and wheezing. Negative for hemoptysis and sputum production.   Cardiovascular: Negative for chest pain.  Gastrointestinal: Negative for abdominal pain, constipation, diarrhea, heartburn, nausea and vomiting.  Musculoskeletal: Negative for joint pain and myalgias.  Skin: Negative for itching and rash.  Neurological: Positive for tremors ( jittery). Negative for dizziness and headaches.  Psychiatric/Behavioral: The patient is nervous/anxious.     All other systems negative unless noted above in HPI  Past  medical/social/surgical/family history have been reviewed and are unchanged unless specifically indicated below.  No changes  Medication List: Allergies as of 01/12/2018      Reactions   Fluticasone-salmeterol    REACTION: hives   Scallops [shellfish Allergy] Hives      Medication List        Accurate as of 01/12/18  7:06 PM. Always use your most recent med list.          albuterol 108 (90 Base) MCG/ACT inhaler Commonly known as:  PROAIR HFA Inhale 4 puffs into the lungs every 4  (four) hours as needed for wheezing or shortness of breath.   albuterol (2.5 MG/3ML) 0.083% nebulizer solution Commonly known as:  PROVENTIL Take 3 mLs (2.5 mg total) by nebulization every 6 (six) hours as needed for wheezing or shortness of breath.   beclomethasone 80 MCG/ACT inhaler Commonly known as:  QVAR REDIHALER Inhale two puffs twice daily to prevent cough or wheeze   benzonatate 100 MG capsule Commonly known as:  TESSALON Take 1 capsule (100 mg total) by mouth 2 (two) times daily as needed for cough.   Biotin 1000 MCG tablet Take 1,000 mcg by mouth 3 (three) times daily.   BOSWELLIA PO Take by mouth daily.   Bromelains 500 MG Tabs Take 1 tablet by mouth 2 (two) times daily.   CALCIUM 500 PO Take by mouth.   cetirizine 10 MG tablet Commonly known as:  ZYRTEC Take 10 mg by mouth daily.   chlorpheniramine-HYDROcodone 10-8 MG/5ML Suer Commonly known as:  TUSSIONEX PENNKINETIC ER Take 5 mLs by mouth 2 (two) times daily.   clonazePAM 1 MG tablet Commonly known as:  KLONOPIN Take 1 tablet (1 mg total) by mouth 2 (two) times daily.   co-enzyme Q-10 30 MG capsule Take 30 mg by mouth 3 (three) times daily.   cyclobenzaprine 10 MG tablet Commonly known as:  FLEXERIL Take 1 tablet (10 mg total) by mouth 3 (three) times daily as needed for muscle spasms.   doxycycline 100 MG tablet Commonly known as:  VIBRA-TABS Take 1 tablet (100 mg total) by mouth 2 (two) times daily.   fexofenadine 180 MG tablet Commonly known as:  ALLEGRA Take 180 mg by mouth daily.   FISH OIL PO Take by mouth.   levalbuterol 1.25 MG/3ML nebulizer solution Commonly known as:  XOPENEX Take 1.25 mg by nebulization every 6 (six) hours as needed for wheezing.   levalbuterol 45 MCG/ACT inhaler Commonly known as:  XOPENEX HFA Inhale 2 puffs into the lungs every 6 (six) hours as needed for wheezing.   MAGNESIUM CARBONATE PO Take by mouth.   montelukast 10 MG tablet Commonly known as:   SINGULAIR TAKE 1 TABLET BY MOUTH DAILY   multivitamin tablet Take 1 tablet by mouth daily.   ondansetron 4 MG tablet Commonly known as:  ZOFRAN Take 1 tablet (4 mg total) by mouth every 8 (eight) hours as needed for nausea or vomiting.   pantoprazole 40 MG tablet Commonly known as:  PROTONIX TAKE 1 TABLET(40 MG) BY MOUTH DAILY   Probiotic 250 MG Caps Take 1 capsule by mouth daily.   RANITIDINE HCL PO Take 300 mg by mouth at bedtime.   sertraline 50 MG tablet Commonly known as:  ZOLOFT TAKE 1 TABLET(50 MG) BY MOUTH DAILY   simvastatin 20 MG tablet Commonly known as:  ZOCOR TAKE 1 TABLET(20 MG) BY MOUTH AT BEDTIME   TART CHERRY ADVANCED PO Take by mouth.   TART CHERRY  ADVANCED Caps Take 1 capsule by mouth daily.   TURMERIC PO Take by mouth.   Vitamin D 2000 units tablet Take 2,000 Units by mouth daily.       Known medication allergies: Allergies  Allergen Reactions  . Fluticasone-Salmeterol     REACTION: hives  . Scallops [Shellfish Allergy] Hives     Physical examination: Blood pressure 130/78, pulse 88, temperature 98.1 F (36.7 C), temperature source Oral, resp. rate 20, SpO2 96 %.  General: Alert, interactive, in no acute distress. HEENT: PERRLA, TMs pearly gray, turbinates mildly edematous with clear discharge, post-pharynx non erythematous. Neck: Supple without lymphadenopathy. Lungs: Mildly decreased breath sounds with expiratory wheezing bilaterally. {increased work of breathing .   Significant coughing throughout exam.  Improved aeration and decreased wheezing post Xopenex neb CV: Normal S1, S2 without murmurs. Abdomen: Nondistended, nontender. Skin: Warm and dry, without lesions or rashes. Extremities:  No clubbing, cyanosis or edema. Neuro:   Grossly intact.  Diagnositics/Labs: Imaging: Chest x-ray from 01/01/2018 personally reviewed and appears unremarkable.  Impression: Normal chest radiograph  Chest x-ray from 01/04/2018 personally reviewed  there are patchy infiltrates in the perihilar region bilaterally however appears to be more so on the left Impression: Development of bilateral perihilar and peripheral lung opacities over the past 3 days.  Findings suspicious for multifocal pneumonia, distribution is atypical for pulmonary edema.  Lower lung volumes.  Slight increased cardiac size likely secondary to lower lung volumes.    Spirometry not done due to current respiratory infection  Assessment and plan:   Moderate persistent asthma with acute exacerbation secondary to influenza and               pneumonia  -she has been having a rough several weeks with respiratory infection and has been treated appropriately with Tamiflu and antibiotics including azithromycin for atypical coverage and doxycycline for respiratory flora coverage.  However she is still very symptomatic and feels she is unable to use her ICS and has been more dependent on albuterol which is subsequently making her anxiety and jitteriness worse.  She was given a Xopenex neb in the office with improved respiratory symptoms and she did report not having similar symptoms as she does with albuterol.  Thus we will have her replace albuterol with Xopenex.  I am hoping that this will decrease her anxiety to allow her to breathe more freely and be able to use her Qvar appropriately.  I also have provided her with an additional 5-day course of prednisone and I am unsure if she was adequately dosed given she took  prednisone previously prescribed for a different condition.  I do not feel at this time that she warrants any further antibiotic coverage. Allergic rhinitis LPRD  1. Continue Qvar 80 REDIHALER two inhalations two times per day.   2. Continue Ranitidine 300 in PM. Can add Protonix 40 mg one time per day if needed   3. Continue montelukast 10 mg one tablet one time per day  4. Stop ProAir HFA and use Xopenex instead to decrease adverse side effects related to Proair.  Use  every 6 hours as needed for cough/wheeze/shortness of breath/chest tightness.   Monitor frequency of use.    5. Zyrtec / Allergra if needed.  6. Can add over-the-counter Mucinex and nasal saline if needed  7. Can add OTC Nasacort one spray each nostril 3-7 times per week.  May use Ayr nasal spray daily to help moisturize/clean nose and use prior to medicated nasal sprays.  8. "Action plan" for asthma flare up:   A. increase Qvar to 3 inhalations 3 times a day  B. use xopenex as needed  9. For this most recent episode utilize prednisone 20mg  twice a day x 5 days  10.  Complete doxycycline course.  Use tussionex as needed as prescribed for cough  11. Return to clinic in 12 weeks or earlier if problem   I appreciate the opportunity to take part in Earnest's care. Please do not hesitate to contact me with questions.  Sincerely,   Margo AyeShaylar Estle Sabella, MD Allergy/Immunology [facility]

## 2018-01-12 NOTE — Patient Instructions (Addendum)
  1. Continue Qvar 80 REDIHALER two inhalations two times per day.   2. Continue Ranitidine 300 in PM. Can add Protonix 40 mg one time per day if needed   3. Continue montelukast 10 mg one tablet one time per day  4. Stop ProAir HFA and use Xopenex instead to decrease adverse side effects related to Proair.  Use every 6 hours as needed for cough/wheeze/shortness of breath/chest tightness.   Monitor frequency of use.    5. Zyrtec / Allergra if needed.  6. Can add over-the-counter Mucinex and nasal saline if needed  7. Can add OTC Nasacort one spray each nostril 3-7 times per week.  May use Ayr nasal spray daily to help moisturize/clean nose and use prior to medicated nasal sprays.  8. "Action plan" for asthma flare up:   A. increase Qvar to 3 inhalations 3 times a day  B. use xopenex as needed  9. For this most recent episode utilize prednisone 20mg  twice a day x 5 days  10.  Complete doxycycline course.  Use tussionex as needed as prescribed for cough  11. Return to clinic in 12 weeks or earlier if problem

## 2018-01-13 ENCOUNTER — Other Ambulatory Visit: Payer: Self-pay

## 2018-01-13 MED ORDER — FLUCONAZOLE 150 MG PO TABS: 150.0000 mg | ORAL_TABLET | Freq: Every day | ORAL | 0 refills | Status: DC

## 2018-01-13 NOTE — Telephone Encounter (Signed)
RX for Diflucan 150 sent into Pt's pharmacy, pt advised.

## 2018-01-13 NOTE — Telephone Encounter (Signed)
Please send in difluczon 150mg  as single dose

## 2018-01-14 NOTE — Progress Notes (Signed)
   Subjective:    Patient ID: Paula Bradley, female    DOB: Jan 17, 1961, 57 y.o.   MRN: [id number]  HPI She went to [place] in the middle of the night and waited for some time without being seen.  She left around 6 AM.  She had a chest x-ray that showed perihilar and peripheral lung opacities bilaterally suspicious for multifocal pneumonia.  Was not thought to have pulmonary edema.  Apparently has not been eating and drinking very well.  We drew CBC and C met today.  Her sodium is 127 and chloride 89.  I think she is volume depleted. White blood cell count is normal.  Hemoglobin 13.5 g. Long discussion with patient and her husband regarding her situation.  She has been slow to improve.  She is anxious.  We initially saw her on January 7.  She was in the emergency department on January 6 and at that time chest x-ray was negative for pneumonia.  She had been treated in [place] on January 3 with Tamiflu and Tussionex.  Dr. Shaune Leeks called in a Z-Pak for her over the weekend she says.  In the emergency department January 6 she was given albuterol nebulizer solution to use at home and [facility].  She had a leftover prescription for prednisone and had started that last week.  Review of Systems see above     Objective:   Physical Exam She has coarse breath sounds and rhonchi bilaterally.  Not a lot of wheezing.  Pharynx and TMs are clear.  Neck is supple.  She is very anxious.       Assessment & Plan:  Bilateral pneumonia  Volume depletion  Anxiety  History of asthma  Hyponatremia  Plan: Her labs have returned and she is found to be hyponatremic.  She will return tomorrow for follow-up.  I am concerned about Legionella with her being hyponatremic and having pneumonia.  She is on doxycycline.  She will be put on prednisone taper.  Was given 1 g IM Rocephin.  Tussionex 1 teaspoon p.o. every 12 hours as needed cough.  Was placed on doxycycline on January 9 after I spoke with  her on the phone.  Take Xanax for anxiety.  25 minutes spent with patient and her husband

## 2018-01-14 NOTE — Patient Instructions (Signed)
Continue doxycycline.  Take prednisone taper.  Take Tussionex for cough and [facility].  1 g IM Rocephin given.  Needs to hydrate herself well with fluids and try to eat.

## 2018-01-17 ENCOUNTER — Encounter: Payer: Self-pay | Admitting: Internal Medicine

## 2018-01-17 ENCOUNTER — Ambulatory Visit (INDEPENDENT_AMBULATORY_CARE_PROVIDER_SITE_OTHER): Payer: BLUE CROSS/BLUE SHIELD | Admitting: Internal Medicine

## 2018-01-17 VITALS — BP 110/70 | HR 65 | Temp 98.7°F | Ht 64.0 in | Wt 183.0 lb

## 2018-01-17 DIAGNOSIS — J189 Pneumonia, unspecified organism: Secondary | ICD-10-CM | POA: Diagnosis not present

## 2018-01-17 DIAGNOSIS — Z8709 Personal history of other diseases of the respiratory system: Secondary | ICD-10-CM

## 2018-01-17 DIAGNOSIS — F419 Anxiety disorder, unspecified: Secondary | ICD-10-CM | POA: Diagnosis not present

## 2018-01-17 MED ORDER — HYDROCOD POLST-CPM POLST ER 10-8 MG/5ML PO SUER: 5.0000 mL | Freq: Two times a day (BID) | ORAL | 0 refills | Status: DC | PRN

## 2018-01-17 NOTE — Progress Notes (Signed)
   Subjective:    Patient ID: Paula Bradley, female    DOB: 11-11-1961, 57 y.o.   MRN: [id number]  HPI She is here today to follow-up on pneumonia.  Allergist changed her from albuterol nebulizer Xopenex.  She is less jittery on Xopenex.  She apparently developed thrush and was treated by allergist with Diflucan.  Doxycycline was refilled on January 15 for an additional 10 days.  She continues to cough.  However cough is much better and she is less short of breath.  She is feeling better and looks well-hydrated.  Chest x-ray on January 9 showed bilateral perihilar and peripheral mid lower lung zone opacities.  She has not been re-x-rayed since that time.    Review of Systems no new complaints.  Appetite improved.  Less anxious.     Objective:   Physical Exam Skin warm and dry.  Nodes none.  TMs clear.  Pharynx clear with no evidence of thrush.  Neck is supple.  Chest is clear to auscultation without rales or wheezing.       Assessment & Plan:  Resolving pneumonia  Plan: Finish course of antibiotics as prescribed.  Take Tussionex sparingly for cough and follow-up January 31.  At that time she will have repeat chest x-ray prior to coming to the office.  Also at that time she will need to be given flu vaccine and Prevnar 13.  Questions answered.  25 minutes spent with patient.

## 2018-01-17 NOTE — Patient Instructions (Addendum)
Tussionex refilled.  Finish course of antibiotics.  Return January 31 after having chest x-ray for follow-up.  Will need flu vaccine and Prevnar 13 upon return.  Continue Xopenex if needed.  Continue Qvar inhaler.

## 2018-01-26 ENCOUNTER — Ambulatory Visit (INDEPENDENT_AMBULATORY_CARE_PROVIDER_SITE_OTHER): Payer: BLUE CROSS/BLUE SHIELD | Admitting: Internal Medicine

## 2018-01-26 ENCOUNTER — Encounter: Payer: Self-pay | Admitting: Internal Medicine

## 2018-01-26 ENCOUNTER — Ambulatory Visit
Admission: RE | Admit: 2018-01-26 | Discharge: 2018-01-26 | Disposition: A | Discharge status: Home / Self Care | Payer: BLUE CROSS/BLUE SHIELD | Source: Ambulatory Visit | Attending: Internal Medicine | Admitting: Internal Medicine

## 2018-01-26 VITALS — Ht 64.0 in

## 2018-01-26 DIAGNOSIS — J189 Pneumonia, unspecified organism: Secondary | ICD-10-CM

## 2018-01-26 DIAGNOSIS — Z8701 Personal history of pneumonia (recurrent): Secondary | ICD-10-CM | POA: Diagnosis not present

## 2018-01-26 DIAGNOSIS — F419 Anxiety disorder, unspecified: Secondary | ICD-10-CM

## 2018-01-26 DIAGNOSIS — Z8709 Personal history of other diseases of the respiratory system: Secondary | ICD-10-CM

## 2018-01-26 NOTE — Progress Notes (Signed)
   Subjective:    Patient ID: Paula Bradley, female    DOB: October 31, 1961, 57 y.o.   MRN: [id number]  HPI Here today to follow-up on pneumonia.  Since last visit, she went to her beach home and spent the night with her brother.  She did not get a lot of rest.  She has been furniture shopping here recently for her beach home.  She went to the eye doctor.  Her x-ray today is not completely clear but improved.  She still coughing.  Bringing up some light yellow sputum at times.  She is finishing up antibiotic treatment.  Chest x-ray shows significantly improved aeration but mild bibasilar airspace filling persists.    Review of Systems see above-no fever or chills.  She is coughing a lot in the office today     Objective:   Physical Exam  Skin warm and dry.  Nodes none.  Neck is supple.  Chest clear to auscultation without wheezing.  No frank rales.      Assessment & Plan:  Bilateral perihilar and peripheral mid lower lung zone opacities improved but chest x-ray not completely clear  History of asthma  Anxiety  Plan: I would like her walking on the treadmill slowly for 15 minutes a day.  This may help improve bibasilar  aeration.  She is on maximum therapy.  She should finish her antibiotic.  I do not think more antibiotics will help at this point.  In 4 weeks and will repeat chest x-ray.  She is to continue to rest.  I do not want her going back to the beach house for a couple of weeks.  She is involved and redecorating it.

## 2018-01-26 NOTE — Patient Instructions (Signed)
Repeat chest x-ray in 4 weeks.  Finish course of antibiotics and continue asthma medications.  Continue to rest and avoid cold temperatures outside.  Try walking slowly on treadmill 15 minutes daily.

## 2018-02-03 ENCOUNTER — Ambulatory Visit: Payer: BLUE CROSS/BLUE SHIELD | Admitting: Internal Medicine

## 2018-02-04 ENCOUNTER — Ambulatory Visit (INDEPENDENT_AMBULATORY_CARE_PROVIDER_SITE_OTHER): Payer: BLUE CROSS/BLUE SHIELD | Admitting: Internal Medicine

## 2018-02-04 ENCOUNTER — Other Ambulatory Visit: Payer: Self-pay | Admitting: Internal Medicine

## 2018-02-04 ENCOUNTER — Telehealth: Payer: Self-pay | Admitting: Internal Medicine

## 2018-02-04 ENCOUNTER — Encounter: Payer: Self-pay | Admitting: Internal Medicine

## 2018-02-04 VITALS — Temp 99.6°F

## 2018-02-04 DIAGNOSIS — R509 Fever, unspecified: Secondary | ICD-10-CM

## 2018-02-04 DIAGNOSIS — J988 Other specified respiratory disorders: Secondary | ICD-10-CM

## 2018-02-04 DIAGNOSIS — R05 Cough: Secondary | ICD-10-CM

## 2018-02-04 DIAGNOSIS — B349 Viral infection, unspecified: Secondary | ICD-10-CM

## 2018-02-04 DIAGNOSIS — R059 Cough, unspecified: Secondary | ICD-10-CM

## 2018-02-04 MED ORDER — AZITHROMYCIN 250 MG PO TABS: ORAL_TABLET | ORAL | 0 refills | Status: DC

## 2018-02-04 MED ORDER — OSELTAMIVIR PHOSPHATE 75 MG PO CAPS: 75.0000 mg | ORAL_CAPSULE | Freq: Two times a day (BID) | ORAL | 0 refills | Status: DC

## 2018-02-04 NOTE — Telephone Encounter (Addendum)
Feels she has fever. Has malaise, dry heaves one episode of diarrhea, slight cough, myalgias. Did not take flu vaccine. Just getting over pneumonia. May have had exudate on tonsils Feb 3rd. [place]. Called answering service at apprx 9am.  Full note for OV to follow.

## 2018-02-06 ENCOUNTER — Ambulatory Visit (INDEPENDENT_AMBULATORY_CARE_PROVIDER_SITE_OTHER): Payer: BLUE CROSS/BLUE SHIELD | Admitting: Internal Medicine

## 2018-02-06 ENCOUNTER — Encounter: Payer: Self-pay | Admitting: Internal Medicine

## 2018-02-06 ENCOUNTER — Ambulatory Visit
Admission: RE | Admit: 2018-02-06 | Discharge: 2018-02-06 | Disposition: A | Discharge status: Home / Self Care | Payer: BLUE CROSS/BLUE SHIELD | Source: Ambulatory Visit | Attending: Internal Medicine | Admitting: Internal Medicine

## 2018-02-06 VITALS — BP 110/72 | HR 79 | Temp 99.0°F | Ht 64.0 in

## 2018-02-06 DIAGNOSIS — Z8249 Family history of ischemic heart disease and other diseases of the circulatory system: Secondary | ICD-10-CM

## 2018-02-06 DIAGNOSIS — J988 Other specified respiratory disorders: Secondary | ICD-10-CM | POA: Diagnosis not present

## 2018-02-06 DIAGNOSIS — R05 Cough: Secondary | ICD-10-CM

## 2018-02-06 DIAGNOSIS — R059 Cough, unspecified: Secondary | ICD-10-CM

## 2018-02-06 LAB — CBC WITH DIFFERENTIAL/PLATELET
Basophils Absolute: 43 cells/uL (ref 0–200)
Basophils Relative: 0.6 %
Eosinophils Absolute: 142 cells/uL (ref 15–500)
Eosinophils Relative: 2 %
HCT: 39.5 % (ref 35.0–45.0)
Hemoglobin: 13.6 g/dL (ref 11.7–15.5)
Lymphs Abs: 1285 cells/uL (ref 850–3900)
MCH: 30.6 pg (ref 27.0–33.0)
MCHC: 34.4 g/dL (ref 32.0–36.0)
MCV: 89 fL (ref 80.0–100.0)
MPV: 10.3 fL (ref 7.5–12.5)
Monocytes Relative: 11.7 %
Neutro Abs: 4800 cells/uL (ref 1500–7800)
Neutrophils Relative %: 67.6 %
Platelets: 293 10*3/uL (ref 140–400)
RBC: 4.44 10*6/uL (ref 3.80–5.10)
RDW: 12.6 % (ref 11.0–15.0)
Total Lymphocyte: 18.1 %
WBC mixed population: 831 cells/uL (ref 200–950)
WBC: 7.1 10*3/uL (ref 3.8–10.8)

## 2018-02-06 LAB — COMPLETE METABOLIC PANEL WITH GFR
AG Ratio: 1.6 (calc) (ref 1.0–2.5)
ALT: 24 U/L (ref 6–29)
AST: 19 U/L (ref 10–35)
Albumin: 4.1 g/dL (ref 3.6–5.1)
Alkaline phosphatase (APISO): 67 U/L (ref 33–130)
BUN: 11 mg/dL (ref 7–25)
CO2: 26 mmol/L (ref 20–32)
Calcium: 9.3 mg/dL (ref 8.6–10.4)
Chloride: 103 mmol/L (ref 98–110)
Creat: 0.63 mg/dL (ref 0.50–1.05)
GFR, Est African American: 115 mL/min/{1.73_m2} (ref >=60)
GFR, Est Non African American: 99 mL/min/{1.73_m2} (ref >=60)
Globulin: 2.5 g/dL (calc) (ref 1.9–3.7)
Glucose, Bld: 91 mg/dL (ref 65–99)
Potassium: 4.2 mmol/L (ref 3.5–5.3)
Sodium: 138 mmol/L (ref 135–146)
Total Bilirubin: 0.5 mg/dL (ref 0.2–1.2)
Total Protein: 6.6 g/dL (ref 6.1–8.1)

## 2018-02-06 LAB — POCT RAPID STREP A (OFFICE): Rapid Strep A Screen: NEGATIVE

## 2018-02-06 LAB — RESPIRATORY VIRUS PANEL

## 2018-02-06 NOTE — Progress Notes (Signed)
   Subjective:    Patient ID: Paula Bradley, female    DOB: 24-Jun-1961, 57 y.o.   MRN: [id number]  HPI 57 year old Female with recent pneumonia called at 9 am today (Saturday)  with c/o fever. Does not have working thermometer at home. Office not open today but I met patient at office at 11am.  Respiratory panel obtained today. She never took flu vaccine. Is due for Prevnar 13 in the future but has not felt well since recent dx of pneumonia. Lots of anxiety. Husband is away in [place] skiiing. Daughter is in town.  Patient has had recent sore throat.  Noticed what she thought was an exudate on her posterior throat around February 3.  She thought she had thrush and took some medication for thrush.  Has malaise and fatigue.  Some cough. Had x[facility] at [facility] ED Jan 9th showing bilateral perihilar and peripheral lung opacities. By January 31st had improvement in CXR. Respiratory virus panel Jan 11 was negative.Legionella antigen in urine was negative.CBC Jan 15th showed elevated WBC but pt was on steroids for coughing and wheezing. Hx of asthma. This will be repeated on Monday.   2 rapid strep test performed today are nagative,  Review of Systems see above. Had dry heaves and diarrhea this am, anxious     Objective:   Physical Exam Skin warm and dry.  Temperature 99.6 degrees.  Neck is supple.  No adenopathy.  Pharynx is slightly red without exudate.  TMs are clear.  Neck is supple.  Chest clear to auscultation.       Assessment & Plan:  ?  Influenza versus pharyngitis-still can have strep throat as rapid strep screens are not 100% accurate  Plan: Respiratory virus panel obtained.  Treat with Tamiflu 75 mg twice daily for 5 days and Zithromax Z-Pak 2 tablets day 1 followed by 1 tablet days 2 through 5.  Zofran as needed for nausea.  Rest and drink plenty of fluids.  Follow-up on Monday, February 11.  She will have chest x-ray on that day as well in addition to CBC and C met.

## 2018-02-06 NOTE — Progress Notes (Signed)
   Subjective:    Patient ID: Paula Bradley, female    DOB: [birth year], 59 y.o.   MRN: [id number]  HPI In today to follow-up on office visit on February 9.  Has been on Tamiflu and Z-Pak.  She feels better.  Chest x-ray today shows no evidence of pneumonia.  Respiratory virus panel is pending.  CBC and C met are drawn today.  She still has low-grade fever of 99.  Coughing a bit.  Throat is scratchy.    Review of Systems     Objective:   Physical Exam  Neck is supple.  Pharynx slightly injected without exudate.  Chest clear to auscultation.  She looks much better and appears to have more energy.  Coughing some.      Assessment & Plan:  Viral syndrome  Plan: Finish Z-Pak and Tamiflu.  Respiratory virus pending.  She is anxious about her heart because she had a close friend died of a sudden MI after a respiratory infection.  She wants a calcium score obtained.  She will need cardiology referral.  Her allergist will be asked to do some immune status testing.  She has never had pneumonia vaccine and will get  Prevnar 13  in about 3 weeks when she is over this syndrome.

## 2018-02-06 NOTE — Patient Instructions (Signed)
Take Zithromax Z-Pak and Tamiflu as directed.  Zofran as needed for nausea.  Rest and drink plenty of fluids follow-up on February 11.

## 2018-02-06 NOTE — Addendum Note (Signed)
Addended by: Gregery NaVALENCIA, Tashawn Laswell P on: 02/06/2018 12:59 PM   Modules accepted: Orders

## 2018-02-06 NOTE — Patient Instructions (Signed)
Finish Tamiflu and Zithromax.  To have allergy evaluation regarding immune status.  Prevnar 13 to be given in about 3 weeks.  Respiratory virus panel pending.  CBC and C met pending.

## 2018-02-06 NOTE — Addendum Note (Signed)
Addended by: Gregery NaVALENCIA, Deloise Marchant P on: 02/06/2018 12:38 PM   Modules accepted: Orders

## 2018-02-08 NOTE — Addendum Note (Signed)
Addended by: Gregery NaVALENCIA, Brock Larmon P on: 02/08/2018 10:44 AM   Modules accepted: Orders

## 2018-02-13 ENCOUNTER — Encounter: Payer: Self-pay | Admitting: Allergy

## 2018-02-13 ENCOUNTER — Ambulatory Visit (INDEPENDENT_AMBULATORY_CARE_PROVIDER_SITE_OTHER): Payer: BLUE CROSS/BLUE SHIELD | Admitting: Allergy

## 2018-02-13 VITALS — BP 122/72 | HR 80 | Resp 16

## 2018-02-13 DIAGNOSIS — K219 Gastro-esophageal reflux disease without esophagitis: Secondary | ICD-10-CM

## 2018-02-13 DIAGNOSIS — J4540 Moderate persistent asthma, uncomplicated: Secondary | ICD-10-CM | POA: Diagnosis not present

## 2018-02-13 DIAGNOSIS — J3089 Other allergic rhinitis: Secondary | ICD-10-CM | POA: Diagnosis not present

## 2018-02-13 DIAGNOSIS — B999 Unspecified infectious disease: Secondary | ICD-10-CM

## 2018-02-13 NOTE — Progress Notes (Signed)
Thanks so much. 

## 2018-02-13 NOTE — Progress Notes (Signed)
Follow-up Note  RE: Paula Bradley MRN: [id number] DOB: 23-Apr-1961 Date of Office Visit: 02/13/2018   History of present illness: Paula Bradley is a 57 y.o. female presenting today for immunocompetence work-up.  She was last seen in the office on 01/12/18 for sick visit.  She reports she is doing better since her last visit.  She still has a residual cough.  Due to the severity of her recent infection her PCP, Dr. Lenord Fellers, would like her to have an immunocompetence work-up done.   As far as her infectious history she states this was the first PNA she has had confirmed with CXR.  She has about 1-2 sinus infections per year on average treated with antibiotics.  She has had 1 ear infection as an adult.  She denies any skin infections/abscesses, hospitalizations for IV antibiotics or history of opportunistic infection.  She denies any known immunodeficency in family and no unexpected childhood deaths.  She is UTD with vaccines and has not had a recent s. pneumo vaccine.    Review of systems: Review of Systems  Constitutional: Positive for fever and malaise/fatigue. Negative for chills and weight loss.  HENT: Positive for congestion. Negative for ear discharge, ear pain, nosebleeds, sinus pain, sore throat and tinnitus.   Eyes: Negative for pain, discharge and redness.  Respiratory: Positive for cough and shortness of breath. Negative for wheezing.   Cardiovascular: Negative for chest pain.  Gastrointestinal: Negative for abdominal pain, constipation, diarrhea, heartburn, nausea and vomiting.  Musculoskeletal: Negative for joint pain.  Skin: Negative for itching and rash.  Neurological: Negative for headaches.    All other systems negative unless noted above in HPI  Past medical/social/surgical/family history have been reviewed and are unchanged unless specifically indicated below.  No changes  Medication List: Allergies as of 02/13/2018      Reactions   Fluticasone-salmeterol      REACTION: hives   Scallops [shellfish Allergy] Hives      Medication List        Accurate as of 02/13/18  4:29 PM. Always use your most recent med list.          albuterol 108 (90 Base) MCG/ACT inhaler Commonly known as:  PROAIR HFA Inhale 4 puffs into the lungs every 4 (four) hours as needed for wheezing or shortness of breath.   beclomethasone 80 MCG/ACT inhaler Commonly known as:  QVAR REDIHALER Inhale two puffs twice daily to prevent cough or wheeze   benzonatate 100 MG capsule Commonly known as:  TESSALON Take 1 capsule (100 mg total) by mouth 2 (two) times daily as needed for cough.   Biotin 1000 MCG tablet Take 1,000 mcg by mouth 3 (three) times daily.   BOSWELLIA PO Take by mouth daily.   Bromelains 500 MG Tabs Take 1 tablet by mouth 2 (two) times daily.   CALCIUM 500 PO Take by mouth.   cetirizine 10 MG tablet Commonly known as:  ZYRTEC Take 10 mg by mouth daily.   chlorpheniramine-HYDROcodone 10-8 MG/5ML Suer Commonly known as:  TUSSIONEX PENNKINETIC ER Take 5 mLs by mouth every 12 (twelve) hours as needed for cough.   clonazePAM 1 MG tablet Commonly known as:  KLONOPIN Take 1 tablet (1 mg total) by mouth 2 (two) times daily.   co-enzyme Q-10 30 MG capsule Take 30 mg by mouth 3 (three) times daily.   cyclobenzaprine 10 MG tablet Commonly known as:  FLEXERIL Take 1 tablet (10 mg total) by mouth 3 (three) times  daily as needed for muscle spasms.   fexofenadine 180 MG tablet Commonly known as:  ALLEGRA Take 180 mg by mouth daily.   FISH OIL PO Take by mouth.   levalbuterol 1.25 MG/3ML nebulizer solution Commonly known as:  XOPENEX Take 1.25 mg by nebulization every 6 (six) hours as needed for wheezing.   levalbuterol 45 MCG/ACT inhaler Commonly known as:  XOPENEX HFA Inhale 2 puffs into the lungs every 6 (six) hours as needed for wheezing.   MAGNESIUM CARBONATE PO Take by mouth.   montelukast 10 MG tablet Commonly known as:   SINGULAIR TAKE 1 TABLET BY MOUTH DAILY   multivitamin tablet Take 1 tablet by mouth daily.   pantoprazole 40 MG tablet Commonly known as:  PROTONIX TAKE 1 TABLET(40 MG) BY MOUTH DAILY   Probiotic 250 MG Caps Take 1 capsule by mouth daily.   RANITIDINE HCL PO Take 300 mg by mouth at bedtime.   sertraline 50 MG tablet Commonly known as:  ZOLOFT TAKE 1 TABLET(50 MG) BY MOUTH DAILY   simvastatin 20 MG tablet Commonly known as:  ZOCOR TAKE 1 TABLET(20 MG) BY MOUTH AT BEDTIME   TART CHERRY ADVANCED PO Take by mouth.   TART CHERRY ADVANCED Caps Take 1 capsule by mouth daily.   TURMERIC PO Take by mouth.   Vitamin D 2000 units tablet Take 2,000 Units by mouth daily.       Known medication allergies: Allergies  Allergen Reactions  . Fluticasone-Salmeterol     REACTION: hives  . Scallops [Shellfish Allergy] Hives     Physical examination: Blood pressure 122/72, pulse 80, resp. rate 16.  General: Alert, interactive, in no acute distress. HEENT: PERRLA, TMs pearly gray, turbinates mildly edematous without discharge, post-pharynx non erythematous. Neck: Supple without lymphadenopathy. Lungs: Clear to auscultation without wheezing, rhonchi or rales. {no increased work of breathing. CV: Normal S1, S2 without murmurs. Abdomen: Nondistended, nontender. Skin: Warm and dry, without lesions or rashes. Extremities:  No clubbing, cyanosis or edema. Neuro:   Grossly intact.  Diagnositics/Labs:  Spirometry: FEV1: 1.49L  57%, FVC: 1.85L 55%, ratio consistent with restrictive pattern   Assessment and plan:   Recurrent sinopulmonary infections  - recent influenza leading PNA and history of 1-2 sinusitis a year. She has also had an ear infection as adult.   Will screen for possible underlying immunodefiency as would be concerned in this situation for poor humoral response.   Moderate persistent asthma  Allergic rhinitis LPRD  1. Continue Qvar 80 REDIHALER two inhalations  two times per day.   2. Continue Ranitidine 300 in PM. Can add Protonix 40 mg one time per day if needed   3. Continue montelukast 10 mg one tablet one time per day  4. Stop ProAir HFA and use Xopenex instead to decrease adverse side effects related to Proair.  Use every 6 hours as needed for cough/wheeze/shortness of breath/chest tightness.   Monitor frequency of use.    5. Zyrtec / Allergra if needed.  6. Can add over-the-counter Mucinex and nasal saline if needed  7. Can add OTC Nasacort one spray each nostril 3-7 times per week.  May use Ayr nasal spray daily to help moisturize/clean nose and use prior to medicated nasal sprays.  8. "Action plan" for asthma flare up:   A. increase Qvar to 3 inhalations 3 times a day  B. use xopenex as needed  9. Immunocompetence work-up ordered including immunoglobulins, vaccine titers and total complement.  She has had normal CBC  and CMP done recently.   10. Return to clinic in 12 weeks or earlier if problem   I appreciate the opportunity to take part in Chaniece's care. Please do not hesitate to contact me with questions.  Sincerely,   Margo Aye, MD Allergy/Immunology [facility]

## 2018-02-13 NOTE — Patient Instructions (Addendum)
  1. Continue Qvar 80 REDIHALER two inhalations two times per day.   2. Continue Ranitidine 300 in PM. Can add Protonix 40 mg one time per day if needed   3. Continue montelukast 10 mg one tablet one time per day  4.  use Xopenex instead to decrease adverse side effects related to Proair.  Use every 6 hours as needed for cough/wheeze/shortness of breath/chest tightness.   Monitor frequency of use.    5. Zyrtec / Allergra if needed.  6. Can add over-the-counter Mucinex and nasal saline if needed  7. Can add OTC Nasacort one spray each nostril 3-7 times per week.  May use Ayr nasal spray daily to help moisturize/clean nose and use prior to medicated nasal sprays.  8. "Action plan" for asthma flare up:   A. increase Qvar to 3 inhalations 3 times a day  B. use xopenex as needed  9. Immunocompetence work-up ordered including immunoglobulins, vaccine titers and total complement.  She has had normal CBC and CMP done recently.   10. Return to clinic in 12 weeks or earlier if problem

## 2018-02-16 NOTE — Addendum Note (Signed)
Addended by: Dub MikesHICKS, ASHLEY N on: 02/16/2018 08:29 AM   Modules accepted: Orders

## 2018-02-17 LAB — STREP PNEUMONIAE 23 SEROTYPES IGG
Pneumo Ab Type 1*: 3.2 ug/mL (ref >1.3)
Pneumo Ab Type 12 (12F)*: 0.2 ug/mL — ABNORMAL LOW (ref >1.3)
Pneumo Ab Type 14*: 9.1 ug/mL (ref >1.3)
Pneumo Ab Type 17 (17F)*: 3.5 ug/mL (ref >1.3)
Pneumo Ab Type 19 (19F)*: 1.8 ug/mL (ref >1.3)
Pneumo Ab Type 2*: 13.1 ug/mL (ref >1.3)
Pneumo Ab Type 20*: 5.6 ug/mL (ref >1.3)
Pneumo Ab Type 22 (22F)*: 0.4 ug/mL — ABNORMAL LOW (ref >1.3)
Pneumo Ab Type 23 (23F)*: 1.3 ug/mL — ABNORMAL LOW (ref >1.3)
Pneumo Ab Type 26 (6B)*: 1 ug/mL — ABNORMAL LOW (ref >1.3)
Pneumo Ab Type 3*: 3.5 ug/mL (ref >1.3)
Pneumo Ab Type 34 (10A)*: 1.6 ug/mL (ref >1.3)
Pneumo Ab Type 4*: 0.2 ug/mL — ABNORMAL LOW (ref >1.3)
Pneumo Ab Type 43 (11A)*: 0.6 ug/mL — ABNORMAL LOW (ref >1.3)
Pneumo Ab Type 5*: 9.1 ug/mL (ref >1.3)
Pneumo Ab Type 51 (7F)*: 3 ug/mL (ref >1.3)
Pneumo Ab Type 54 (15B)*: 1.3 ug/mL — ABNORMAL LOW (ref >1.3)
Pneumo Ab Type 56 (18C)*: 4.3 ug/mL (ref >1.3)
Pneumo Ab Type 57 (19A)*: 9.9 ug/mL (ref >1.3)
Pneumo Ab Type 68 (9V)*: 2.6 ug/mL (ref >1.3)
Pneumo Ab Type 70 (33F)*: 2.5 ug/mL (ref >1.3)
Pneumo Ab Type 8*: 1.5 ug/mL (ref >1.3)
Pneumo Ab Type 9 (9N)*: 1 ug/mL — ABNORMAL LOW (ref >1.3)

## 2018-02-17 LAB — IGG, IGA, IGM
IgA/Immunoglobulin A, Serum: 249 mg/dL (ref 87–352)
IgG (Immunoglobin G), Serum: 952 mg/dL (ref 700–1600)
IgM (Immunoglobulin M), Srm: 85 mg/dL (ref 26–217)

## 2018-02-17 LAB — DIPHTHERIA / TETANUS ANTIBODY PANEL
Diphtheria Ab: 0.37 IU/mL (ref <0.10)
Tetanus Ab, IgG: 1.25 IU/mL (ref <0.10)

## 2018-02-17 LAB — COMPLEMENT, TOTAL: Compl, Total (CH50): >60 U/mL (ref >41)

## 2018-02-22 ENCOUNTER — Telehealth: Payer: Self-pay | Admitting: Allergy

## 2018-02-22 NOTE — Telephone Encounter (Signed)
Pt called back to see about the result to her getting a pneumomiac shot . She has not heard anything. [phone].

## 2018-02-22 NOTE — Telephone Encounter (Signed)
Patient returned call to the office.  Reviewed pneumococcal vaccine and recent labs with patient.  Per Dr. Delorse LekPadgett, patient does not need booster at this time.  Patient does have an appointment with Dr. Lenord FellersBaxley on 02/27/18 and she will discuss with Dr. Lenord FellersBaxley and will get vaccine if Dr. Lenord FellersBaxley wants her to have it done.  Follow up appointment made with Dr. Delorse LekPadgett for 05/17/18 at 10:00 am.

## 2018-02-22 NOTE — Telephone Encounter (Signed)
Called patient.  Left message to return call to office regarding her pneumonia vaccine.

## 2018-02-27 ENCOUNTER — Encounter: Payer: Self-pay | Admitting: Internal Medicine

## 2018-02-27 ENCOUNTER — Other Ambulatory Visit: Payer: Self-pay

## 2018-02-27 ENCOUNTER — Ambulatory Visit (INDEPENDENT_AMBULATORY_CARE_PROVIDER_SITE_OTHER): Payer: BLUE CROSS/BLUE SHIELD | Admitting: Internal Medicine

## 2018-02-27 VITALS — BP 112/80 | HR 66 | Temp 98.1°F

## 2018-02-27 DIAGNOSIS — J4541 Moderate persistent asthma with (acute) exacerbation: Secondary | ICD-10-CM

## 2018-02-27 DIAGNOSIS — Z23 Encounter for immunization: Secondary | ICD-10-CM

## 2018-02-27 MED ORDER — LEVALBUTEROL TARTRATE 45 MCG/ACT IN AERO: 2.0000 | INHALATION_SPRAY | Freq: Four times a day (QID) | RESPIRATORY_TRACT | 1 refills | Status: DC | PRN

## 2018-02-27 NOTE — Progress Notes (Signed)
Prevnar 13 given

## 2018-02-27 NOTE — Progress Notes (Signed)
Refill request received from [facility] at [facility] Refill being sent with 1 additional.

## 2018-02-27 NOTE — Patient Instructions (Signed)
Prevnar 13 given

## 2018-03-12 ENCOUNTER — Other Ambulatory Visit: Payer: Self-pay | Admitting: Internal Medicine

## 2018-03-13 NOTE — Telephone Encounter (Signed)
Refill x 6 months 

## 2018-04-10 ENCOUNTER — Ambulatory Visit: Payer: BLUE CROSS/BLUE SHIELD | Admitting: Cardiology

## 2018-04-27 ENCOUNTER — Telehealth: Payer: Self-pay

## 2018-04-27 NOTE — Telephone Encounter (Signed)
We no longer do this work. Suggest [facility] on [address]. Requires a fair amout of time and testing.

## 2018-04-27 NOTE — Telephone Encounter (Signed)
Patient called is wanting to know which doctor you would recommend for her daughter Paula Bradley for ADD and anxiety. She said her daughter would love to establish care with you if you could help her manage her ADD and anxiety? Please advise thanks.  Call back number [phone] (M)

## 2018-04-27 NOTE — Telephone Encounter (Signed)
Called Renee back and gave her [facility] on [address], and let her know we no longer do that type of work.

## 2018-05-02 ENCOUNTER — Other Ambulatory Visit: Payer: Self-pay | Admitting: Allergy & Immunology

## 2018-05-15 ENCOUNTER — Encounter: Payer: Self-pay | Admitting: Cardiology

## 2018-05-17 ENCOUNTER — Ambulatory Visit: Payer: BLUE CROSS/BLUE SHIELD | Admitting: Allergy

## 2018-05-25 ENCOUNTER — Encounter: Payer: Self-pay | Admitting: Gynecology

## 2018-05-25 ENCOUNTER — Other Ambulatory Visit: Payer: Self-pay | Admitting: Allergy and Immunology

## 2018-05-25 ENCOUNTER — Other Ambulatory Visit: Payer: Self-pay

## 2018-05-25 ENCOUNTER — Other Ambulatory Visit: Payer: Self-pay | Admitting: Internal Medicine

## 2018-05-25 MED ORDER — MONTELUKAST SODIUM 10 MG PO TABS: 10.0000 mg | ORAL_TABLET | Freq: Every day | ORAL | 3 refills | Status: DC

## 2018-05-26 ENCOUNTER — Encounter: Payer: Self-pay | Admitting: Allergy

## 2018-05-26 ENCOUNTER — Ambulatory Visit (INDEPENDENT_AMBULATORY_CARE_PROVIDER_SITE_OTHER): Payer: BLUE CROSS/BLUE SHIELD | Admitting: Allergy

## 2018-05-26 VITALS — BP 138/80 | HR 66 | Temp 98.1°F | Resp 18 | Ht 64.5 in | Wt 188.2 lb

## 2018-05-26 DIAGNOSIS — J3089 Other allergic rhinitis: Secondary | ICD-10-CM

## 2018-05-26 DIAGNOSIS — K219 Gastro-esophageal reflux disease without esophagitis: Secondary | ICD-10-CM

## 2018-05-26 DIAGNOSIS — J4540 Moderate persistent asthma, uncomplicated: Secondary | ICD-10-CM

## 2018-05-26 NOTE — Patient Instructions (Addendum)
  1. Continue Qvar 80 REDIHALER two inhalations two times per day.   2. Can try stopping evening dose of Ranitidine and move Protonix to dinner time.    If you find improved reflux control on both Ranitidine and Protonix then can continue using both agents.    3. Continue montelukast 10 mg one tablet one time per day  4.  use Xopenex 2 puffs every 4-6 hours as needed for cough/wheeze/shortness of breath/chest tightness.   May use 15-20 minutes prior to exercise/activity if needed.  Monitor frequency of use.    5. Zyrtec / Allergra if needed.  6. "Action plan" for asthma flare up:   A. increase Qvar to 3 inhalations 3 times a day  B. use xopenex as needed  7. Immunocompetence work-up done at last visit is reassuring with normal immunoglobulins and normal protective vaccine titers  8. Return to clinic in 4-6 months or sooner if needed

## 2018-05-26 NOTE — Progress Notes (Signed)
Follow-up Note  RE: Paula Bradley MRN: [id number] DOB: 08-04-1961 Date of Office Visit: 05/26/2018   History of present illness: Paula Bradley is a 57 y.o. female presenting today for follow-up of asthma, allergic rhinitis, LPRD.  She also has recent history of PNA with prolonged recovery.  She was last seen in the office on 02/13/18 by myself.  After that visit an immunocompetence work-up was performed which was reassuring.  She finally has recovered from her PNA and is feeling better.   She states now she is using Qvar 2 puffs daily.  She denies using albuterol.  She denies any ED/UC visits or oral steroid needs since last visit.  Denies nighttime awakenings.  She does continue on singulair daily.  She reports has not needed to use any antihistamines as she denies any significant nasal or ocular symptoms.  [place] while her home is being renovated.   She denies any major health changes, surgeries or hospitalizations.    Review of systems: Review of Systems  Constitutional: Negative for chills, fever and malaise/fatigue.  HENT: Negative for congestion, ear discharge, nosebleeds and sore throat.   Eyes: Negative for pain, discharge and redness.  Respiratory: Negative for cough, shortness of breath and wheezing.   Cardiovascular: Negative for chest pain.  Gastrointestinal: Negative for abdominal pain, constipation, diarrhea, nausea and vomiting.  Musculoskeletal: Negative for joint pain.  Skin: Negative for itching and rash.  Neurological: Negative for headaches.    All other systems negative unless noted above in HPI  Past medical/social/surgical/family history have been reviewed and are unchanged unless specifically indicated below.  No changes  Medication List: Allergies as of 05/26/2018      Reactions   Aspirin Hives   Fluticasone-salmeterol Hives   REACTION: hives REACTION: hives   Scallops [shellfish Allergy] Hives      Medication List        Accurate as of 05/26/18  1:56 PM. Always use your most recent med list.          beclomethasone 80 MCG/ACT inhaler Commonly known as:  QVAR REDIHALER Inhale 2 puffs twice daily. Increase to 3 puffs 3 times daily for asthma flare.   Biotin 1000 MCG tablet Take 1,000 mcg by mouth 3 (three) times daily.   Bromelains 500 MG Tabs Take by mouth.   CALCIUM 500 PO Take by mouth.   cetirizine 10 MG tablet Commonly known as:  ZYRTEC Take 10 mg by mouth daily.   chlorpheniramine-HYDROcodone 10-8 MG/5ML Suer Commonly known as:  TUSSIONEX PENNKINETIC ER Take 5 mLs by mouth every 12 (twelve) hours as needed for cough.   clonazePAM 1 MG tablet Commonly known as:  KLONOPIN TAKE 1 TABLET BY MOUTH TWICE DAILY   co-enzyme Q-10 30 MG capsule Take 30 mg by mouth 3 (three) times daily.   cyclobenzaprine 10 MG tablet Commonly known as:  FLEXERIL Take 1 tablet (10 mg total) by mouth 3 (three) times daily as needed for muscle spasms.   fexofenadine 180 MG tablet Commonly known as:  ALLEGRA Take 180 mg by mouth daily.   FISH OIL PO Take by mouth.   levalbuterol 1.25 MG/3ML nebulizer solution Commonly known as:  XOPENEX Take 1.25 mg by nebulization every 6 (six) hours as needed for wheezing.   levalbuterol 45 MCG/ACT inhaler Commonly known as:  XOPENEX HFA Inhale 2 puffs into the lungs every 6 (six) hours as needed for wheezing.   MAGNESIUM  CARBONATE PO Take by mouth.   montelukast 10 MG tablet Commonly known as:  SINGULAIR Take 1 tablet (10 mg total) by mouth daily.   multivitamin tablet Take 1 tablet by mouth daily.   pantoprazole 40 MG tablet Commonly known as:  PROTONIX TAKE 1 TABLET BY MOUTH EVERY DAY FOR REFLUX   Probiotic 250 MG Caps Take 1 capsule by mouth daily.   RANITIDINE HCL PO Take 300 mg by mouth at bedtime.   RANITIDINE HCL PO Take by mouth.   sertraline 50 MG tablet Commonly known as:  ZOLOFT TAKE 1 TABLET(50 MG) BY MOUTH  DAILY   simvastatin 20 MG tablet Commonly known as:  ZOCOR TAKE 1 TABLET(20 MG) BY MOUTH AT BEDTIME   TART CHERRY ADVANCED PO Take by mouth.   TART CHERRY ADVANCED Caps Take 1 capsule by mouth daily.   TURMERIC PO Take by mouth.   Vitamin D 2000 units tablet Take 2,000 Units by mouth daily.       Known medication allergies: Allergies  Allergen Reactions  . Aspirin Hives  . Fluticasone-Salmeterol Hives    REACTION: hives REACTION: hives  . Scallops [Shellfish Allergy] Hives     Physical examination: Blood pressure 138/80, pulse 66, temperature 98.1 F (36.7 C), temperature source Oral, resp. rate 18, height 5' 4.5" (1.638 m), weight 188 lb 3.2 oz (85.4 kg), SpO2 97 %.  General: Alert, interactive, in no acute distress. HEENT: PERRLA, TMs pearly gray, turbinates minimally edematous without discharge, post-pharynx non erythematous. Neck: Supple without lymphadenopathy. Lungs:  End expiratory wheezing in RUL with remaining fields clear, no rhonchi or rales. {no increased work of breathing. CV: Normal S1, S2 without murmurs. Abdomen: Nondistended, nontender.   Skin: Warm and dry, without lesions or rashes. Extremities:  No clubbing, cyanosis or edema. Neuro:   Grossly intact.  Diagnositics/Labs: Labs:  Component     Latest Ref Rng & Units 02/13/2018  Pneumo Ab Type 1*     >1.3 ug/mL 3.2  Pneumo Ab Type 3*     >1.3 ug/mL 3.5  Pneumo Ab Type 4*     >1.3 ug/mL 0.2 (L)  Pneumo Ab Type 8*     >1.3 ug/mL 1.5  Pneumo Ab Type 9 (9N)*     >1.3 ug/mL 1.0 (L)  Pneumo Ab Type 12 (63F)*     >1.3 ug/mL 0.2 (L)  Pneumo Ab Type 14*     >1.3 ug/mL 9.1  Pneumo Ab Type 17 (68F)*     >1.3 ug/mL 3.5  Pneumo Ab Type 19 (5F)*     >1.3 ug/mL 1.8  Pneumo Ab Type 2*     >1.3 ug/mL 13.1  Pneumo Ab Type 20*     >1.3 ug/mL 5.6  Pneumo Ab Type 22 (318F)*     >1.3 ug/mL 0.4 (L)  Pneumo Ab Type 23 (44F)*     >1.3 ug/mL 1.3 (L)  Pneumo Ab Type 26 (6B)*     >1.3 ug/mL 1.0 (L)    Pneumo Ab Type 34 (10A)*     >1.3 ug/mL 1.6  Pneumo Ab Type 43 (11A)*     >1.3 ug/mL 0.6 (L)  Pneumo Ab Type 5*     >1.3 ug/mL 9.1  Pneumo Ab Type 51 (18F)*     >1.3 ug/mL 3.0  Pneumo Ab Type 54 (15B)*     >1.3 ug/mL 1.3 (L)  Pneumo Ab Type 56 (18C)*     >1.3 ug/mL 4.3  Pneumo Ab Type 57 (19A)*     >  1.3 ug/mL 9.9  Pneumo Ab Type 68 (9V)*     >1.3 ug/mL 2.6  Pneumo Ab Type 70 (32F)*     >1.3 ug/mL 2.5  IgG (Immunoglobin G), Serum     700 - 1,600 mg/dL 161  IgA/Immunoglobulin A, Serum     87 - 352 mg/dL 096  IgM (Immunoglobulin M), Srm     26 - 217 mg/dL 85  Tetanus Ab, IgG     <0.10 IU/mL 1.25  Diphtheria Ab     <0.10 IU/mL 0.37  Compl, Total (CH50)     >41 U/mL >60    Spirometry: FEV1: 1.57L  58%, FVC: 2.08L 60%  Pattern is restictive however is improved from previous study at last visit.  Assessment and plan: Moderate persistent asthma - control is improved with resolution of respiratory infection Allergic rhinitis - asymptomatic at this time LPRD   1. Continue Qvar 80 REDIHALER two inhalations two times per day.   2. Can try stopping evening dose of Ranitidine and move Protonix to dinner time.    If you find improved reflux control on both Ranitidine and Protonix then can continue using both agents.    3. Continue montelukast 10 mg one tablet one time per day  4.  use Xopenex 2 puffs every 4-6 hours as needed for cough/wheeze/shortness of breath/chest tightness.   May use 15-20 minutes prior to exercise/activity if needed.  Monitor frequency of use.    5. Zyrtec / Allergra if needed.  6. "Action plan" for asthma flare up:   A. increase Qvar to 3 inhalations 3 times a day  B. use xopenex as needed  7. Immunocompetence work-up done at last visit is reassuring with normal immunoglobulins and normal protective vaccine titers  8. Return to clinic in 4-6 months or sooner if needed  I appreciate the opportunity to take part in Drenda's care. Please do not hesitate  to contact me with questions.  Sincerely,   Margo Aye, MD Allergy/Immunology [facility]

## 2018-05-28 ENCOUNTER — Other Ambulatory Visit: Payer: Self-pay | Admitting: Internal Medicine

## 2018-05-30 ENCOUNTER — Other Ambulatory Visit: Payer: BLUE CROSS/BLUE SHIELD | Admitting: Internal Medicine

## 2018-06-02 ENCOUNTER — Ambulatory Visit: Payer: BLUE CROSS/BLUE SHIELD | Admitting: Internal Medicine

## 2018-06-04 ENCOUNTER — Encounter: Payer: Self-pay | Admitting: Internal Medicine

## 2018-06-05 ENCOUNTER — Ambulatory Visit (INDEPENDENT_AMBULATORY_CARE_PROVIDER_SITE_OTHER): Payer: BLUE CROSS/BLUE SHIELD | Admitting: Cardiology

## 2018-06-05 ENCOUNTER — Encounter: Payer: Self-pay | Admitting: Cardiology

## 2018-06-05 VITALS — BP 122/84 | HR 74 | Ht 65.5 in | Wt 188.8 lb

## 2018-06-05 DIAGNOSIS — R079 Chest pain, unspecified: Secondary | ICD-10-CM | POA: Diagnosis not present

## 2018-06-05 DIAGNOSIS — R011 Cardiac murmur, unspecified: Secondary | ICD-10-CM | POA: Diagnosis not present

## 2018-06-05 DIAGNOSIS — Z8249 Family history of ischemic heart disease and other diseases of the circulatory system: Secondary | ICD-10-CM | POA: Diagnosis not present

## 2018-06-05 MED ORDER — METOPROLOL TARTRATE 50 MG PO TABS: 50.0000 mg | ORAL_TABLET | Freq: Once | ORAL | 0 refills | Status: DC

## 2018-06-05 MED ORDER — PREDNISONE 50 MG PO TABS: ORAL_TABLET | ORAL | 0 refills | Status: DC

## 2018-06-05 NOTE — Progress Notes (Signed)
Cardiology Office Note:    Date:  06/05/2018   ID:  Paula Bradley, DOB 1961-02-15, MRN [id number]  PCP:  Margaree Mackintosh, MD  Cardiologist:  No primary care provider on file.   Referring MD: Margaree Mackintosh, MD     History of Present Illness:    Paula Bradley is a 57 y.o. female here for cardiac evaluation at the request of Dr. Lenord Fellers.  Recently had a close friend, Teena Irani who died of a sudden MI after respiratory infection.  She was requesting a calcium score.  Thankfully, no symptoms, no chest pain fevers chills nausea vomiting syncope bleeding.  Currently is taking simvastatin 20 mg.  Excellent.  Brother CABG at 14, MI. Mother CHF, Father CAD 2 stents. She had preg DM.   Had PNA earlier this year. [place]. Home in [place] - Flu.    Went to ER 2 times thinking she was having MI. [place] [facility] [place] [facility], angina.  Canceled a [place] trip because of this once.  Chest pain was severe substernal/epigastric.  No other radiation.  No recent discomfort.  Recently quite anxious.  Past Medical History:  Diagnosis Date  . Allergic rhinitis   . Anxiety   . Anxiety and depression   . Asthma   . Depression   . Hyperlipidemia   . Kidney stone   . Menopause     Past Surgical History:  Procedure Laterality Date  . CESAREAN SECTION  1996  . COLPOSCOPY    . DILATION AND CURETTAGE OF UTERUS    . TONSILLECTOMY  1985    Current Medications: Current Meds  Medication Sig  . beclomethasone (QVAR REDIHALER) 80 MCG/ACT inhaler Inhale 2 puffs twice daily. Increase to 3 puffs 3 times daily for asthma flare.  . Biotin 1000 MCG tablet Take 1,000 mcg by mouth daily.   . Calcium-Magnesium-Vitamin D (CALCIUM 500 PO) Take 1 tablet by mouth daily.   . cetirizine (ZYRTEC) 10 MG tablet Take 10 mg by mouth daily as needed.   . Cholecalciferol (VITAMIN D) 2000 UNITS tablet Take 2,000 Units by mouth daily.    Marland Kitchen co-enzyme Q-10 30 MG capsule Take 30 mg by mouth daily.   . fexofenadine  (ALLEGRA) 180 MG tablet Take 180 mg by mouth daily as needed.   . levalbuterol (XOPENEX HFA) 45 MCG/ACT inhaler Inhale 2 puffs into the lungs every 6 (six) hours as needed for wheezing.  . levalbuterol (XOPENEX) 1.25 MG/3ML nebulizer solution Take 1.25 mg by nebulization every 6 (six) hours as needed for wheezing.  Marland Kitchen MAGNESIUM CARBONATE PO Take 1 tablet by mouth daily.   . Misc Natural Products (TART CHERRY ADVANCED) CAPS Take 1 capsule by mouth daily.  . montelukast (SINGULAIR) 10 MG tablet Take 1 tablet (10 mg total) by mouth daily.  . Multiple Vitamin (MULTIVITAMIN) tablet Take 1 tablet by mouth daily.  . Omega-3 Fatty Acids (FISH OIL PO) Take 1 capsule by mouth 2 (two) times daily.   . pantoprazole (PROTONIX) 40 MG tablet TAKE 1 TABLET BY MOUTH EVERY DAY FOR REFLUX  . RANITIDINE HCL PO Take 300 mg by mouth at bedtime.   . Saccharomyces boulardii (PROBIOTIC) 250 MG CAPS Take 1 capsule by mouth daily.  . sertraline (ZOLOFT) 50 MG tablet TAKE 1 TABLET(50 MG) BY MOUTH DAILY  . simvastatin (ZOCOR) 20 MG tablet TAKE 1 TABLET(20 MG) BY MOUTH AT BEDTIME  . TURMERIC PO Take 1 capsule by mouth 2 (two) times daily.  Allergies:   Aspirin; Fluticasone-salmeterol; and Scallops [shellfish allergy]   Social History   Socioeconomic History  . Marital status: Married    Spouse name: Not on file  . Number of children: 2  . Years of education: Not on file  . Highest education level: Not on file  Occupational History  . Not on file  Social Needs  . Financial resource strain: Not on file  . Food insecurity:    Worry: Not on file    Inability: Not on file  . Transportation needs:    Medical: Not on file    Non-medical: Not on file  Tobacco Use  . Smoking status: Never Smoker  . Smokeless tobacco: Never Used  Substance and Sexual Activity  . Alcohol use: Yes    Alcohol/week: 2.4 oz    Types: 4 Standard drinks or equivalent per week    Comment: wine 3-4 glasses per week  . Drug use: No  .  Sexual activity: Yes    Birth control/protection: Post-menopausal    Comment: 1st intercourse 49 yo-5 partners  Lifestyle  . Physical activity:    Days per week: Not on file    Minutes per session: Not on file  . Stress: Not on file  Relationships  . Social connections:    Talks on phone: Not on file    Gets together: Not on file    Attends religious service: Not on file    Active member of club or organization: Not on file    Attends meetings of clubs or organizations: Not on file    Relationship status: Not on file  Other Topics Concern  . Not on file  Social History Narrative  . Not on file     Family History: The patient's family history includes Breast cancer in her cousin; Diabetes in her mother; Heart disease in her brother, father, and mother; Hyperlipidemia in her brother, father, and mother; Hypertension in her father and mother.  ROS:   Please see the history of present illness.     All other systems reviewed and are negative.  EKGs/Labs/Other Studies Reviewed:    The following studies were reviewed today: Prior office note, lab work, EKG reviewed  EKG: 01/01/2018-sinus rhythm heart rate 64 bpm no other significant abnormalities.  Recent Labs: 11/15/2017: TSH 1.62 02/06/2018: ALT 24; BUN 11; Creat 0.63; Hemoglobin 13.6; Platelets 293; Potassium 4.2; Sodium 138  Recent Lipid Panel    Component Value Date/Time   CHOL 196 11/15/2017 1154   TRIG 115 11/15/2017 1154   HDL 80 11/15/2017 1154   CHOLHDL 2.5 11/15/2017 1154   VLDL 19 05/17/2017 0947   LDLCALC 95 11/15/2017 1154    Physical Exam:    VS:  BP 122/84   Pulse 74   Ht 5' 5.5" (1.664 m)   Wt 188 lb 12.8 oz (85.6 kg)   SpO2 96%   BMI 30.94 kg/m     Wt Readings from Last 3 Encounters:  06/05/18 188 lb 12.8 oz (85.6 kg)  05/26/18 188 lb 3.2 oz (85.4 kg)  01/17/18 183 lb (83 kg)     GEN:  Well nourished, well developed in no acute distress HEENT: Normal NECK: No JVD; No carotid  bruits LYMPHATICS: No lymphadenopathy CARDIAC: RRR, 1/6 systolic murmur,no rubs, gallops RESPIRATORY: Mild wheezes heard bilaterally, normal respiratory effort ABDOMEN: Soft, non-tender, non-distended MUSCULOSKELETAL:  No edema; No deformity  SKIN: Warm and dry NEUROLOGIC:  Alert and oriented x 3 PSYCHIATRIC:  Normal affect  ASSESSMENT:    1. Chest pain, unspecified type   2. Family history of early CAD   3. Murmur, cardiac    PLAN:    In order of problems listed above:  Angina/chest pain - Given her strong family history with brother having myocardial infarction bypass surgery at age 53, 2 separate episodes of chest discomfort resulting in emergency department visits, hyperlipidemia, I will go ahead and proceed with coronary artery CT scan with possible FFR.  This would be an excellent way to diagnose if she has any flow-limiting coronary artery disease.  Heart murmur -Right upper sternal border 1/6 systolic murmur, likely flow murmur.  I will check an echocardiogram to ensure that there is no aortic valve abnormality.  She was unaware of previous murmur.  Hyperlipidemia - Excellent use of simvastatin 20 mg.  Continue.  Obviously, if there is significant coronary calcium, we will intensify statin dose/change to atorvastatin 40 or perhaps Crestor 20.  She does take magnesium supplementation which is helped with cramping which she states could be associated with her simvastatin.  Told her that with diet, exercise, her statin is essential especially given her strong early family history of CAD for prevention efforts.  Early family history of CAD - As described above.  Continue with aggressive primary prevention.  Asthma - Soft wheeze heard on exhalation bilaterally.  Long-standing history.  No smoking.  In regards to contrast, she remembers getting whelps a day after eating scallops previously.  She would like to have prednisone prior to contrast.  Usually in the situations we do  not have to provide this but she insisted.  Medication Adjustments/Labs and Tests Ordered: Current medicines are reviewed at length with the patient today.  Concerns regarding medicines are outlined above.  Orders Placed This Encounter  Procedures  . CT CORONARY MORPH W/CTA COR W/SCORE W/CA W/CM &/OR WO/CM  . CT CORONARY FRACTIONAL FLOW RESERVE DATA PREP  . CT CORONARY FRACTIONAL FLOW RESERVE FLUID ANALYSIS  . Basic metabolic panel  . ECHOCARDIOGRAM COMPLETE   Meds ordered this encounter  Medications  . metoprolol tartrate (LOPRESSOR) 50 MG tablet    Sig: Take 1 tablet (50 mg total) by mouth once for 1 dose. Take I tablet 1 hour before your CT scan    Dispense:  1 tablet    Refill:  0    Patient Instructions  Medication Instructions:  The current medical regimen is effective;  continue present plan and medications.  Testing/Procedures: Your physician has requested that you have cardiac CT. Cardiac computed tomography (CT) is a painless test that uses an x-ray machine to take clear, detailed pictures of your heart. For further information please visit [email or web address] Please follow instruction sheet as given.  Your physician has requested that you have an echocardiogram. Echocardiography is a painless test that uses sound waves to create images of your heart. It provides your doctor with information about the size and shape of your heart and how well your heart's chambers and valves are working. This procedure takes approximately one hour. There are no restrictions for this procedure.  Follow-Up: Follow up will be based on the results of the above testing.  Thank you for choosing [facility]!!     Please arrive at the [facility] main entrance of [facility] at xx:xx AM (30-45 minutes prior to test start time)  [facility] [address] [place], [place] [address] [phone]  Proceed to the [facility] Radiology Department (First  Floor).  Please follow these instructions carefully (unless otherwise directed):  Hold all erectile dysfunction medications at least 48 hours prior to test.  On the Night Before the Test: . Drink plenty of water. . Do not consume any caffeinated/decaffeinated beverages or chocolate 12 hours prior to your test. . Do not take any antihistamines 12 hours prior to your test. . If you take Metformin do not take 24 hours prior to test. . If the patient has contrast allergy: ? Patient will need a prescription for Prednisone and very clear instructions (as follows): 1. Prednisone 50 mg - take 13 hours prior to test 2. Take another Prednisone 50 mg 7 hours prior to test 3. Take another Prednisone 50 mg 1 hour prior to test 4. Take Benadryl 50 mg 1 hour prior to test . Patient must complete all four doses of above prophylactic medications. . Patient will need a ride after test due to Benadryl.  On the Day of the Test: . Drink plenty of water. Do not drink any water within one hour of the test. . Do not eat any food 4 hours prior to the test. . You may take your regular medications prior to the test. . IF NOT ON A BETA BLOCKER - Take 50 mg of lopressor (metoprolol) one hour before the test. . HOLD Furosemide morning of the test.  After the Test: . Drink plenty of water. . After receiving IV contrast, you may experience a mild flushed feeling. This is normal. . On occasion, you may experience a mild rash up to 24 hours after the test. This is not dangerous. If this occurs, you can take Benadryl 25 mg and increase your fluid intake. . If you experience trouble breathing, this can be serious. If it is severe call 911 IMMEDIATELY. If it is mild, please call our office. . If you take any of these medications: Glipizide/Metformin, Avandament, Glucavance, please do not take 48 hours after completing test.     Signed, Donato SchultzMark Lyndie Vanderloop, MD  06/05/2018 3:32 PM    [facility]

## 2018-06-05 NOTE — Patient Instructions (Addendum)
Medication Instructions:  The current medical regimen is effective;  continue present plan and medications.  Testing/Procedures: Your physician has requested that you have cardiac CT. Cardiac computed tomography (CT) is a painless test that uses an x-ray machine to take clear, detailed pictures of your heart. For further information please visit [email or web address] Please follow instruction sheet as given.  Your physician has requested that you have an echocardiogram. Echocardiography is a painless test that uses sound waves to create images of your heart. It provides your doctor with information about the size and shape of your heart and how well your heart's chambers and valves are working. This procedure takes approximately one hour. There are no restrictions for this procedure.  Follow-Up: Follow up will be based on the results of the above testing.  Thank you for choosing [facility]!!     Please arrive at the [facility] main entrance of [facility] at xx:xx AM (30-45 minutes prior to test start time)  [facility] [address] [place], [place] [address] [phone]  Proceed to the [facility] Radiology Department (First Floor).  Please follow these instructions carefully (unless otherwise directed):  Hold all erectile dysfunction medications at least 48 hours prior to test.  On the Night Before the Test: . Drink plenty of water. . Do not consume any caffeinated/decaffeinated beverages or chocolate 12 hours prior to your test. . Do not take any antihistamines 12 hours prior to your test. . If you take Metformin do not take 24 hours prior to test. . If the patient has contrast allergy: ? Patient will need a prescription for Prednisone and very clear instructions (as follows): 1. Prednisone 50 mg - take 13 hours prior to test 2. Take another Prednisone 50 mg 7 hours prior to test 3. Take another Prednisone 50 mg 1 hour prior to test 4. Take  Benadryl 50 mg 1 hour prior to test . Patient must complete all four doses of above prophylactic medications. . Patient will need a ride after test due to Benadryl.  On the Day of the Test: . Drink plenty of water. Do not drink any water within one hour of the test. . Do not eat any food 4 hours prior to the test. . You may take your regular medications prior to the test. . IF NOT ON A BETA BLOCKER - Take 50 mg of lopressor (metoprolol) one hour before the test. . HOLD Furosemide morning of the test.  After the Test: . Drink plenty of water. . After receiving IV contrast, you may experience a mild flushed feeling. This is normal. . On occasion, you may experience a mild rash up to 24 hours after the test. This is not dangerous. If this occurs, you can take Benadryl 25 mg and increase your fluid intake. . If you experience trouble breathing, this can be serious. If it is severe call 911 IMMEDIATELY. If it is mild, please call our office. . If you take any of these medications: Glipizide/Metformin, Avandament, Glucavance, please do not take 48 hours after completing test.

## 2018-06-06 ENCOUNTER — Other Ambulatory Visit: Payer: Self-pay

## 2018-06-06 ENCOUNTER — Ambulatory Visit (HOSPITAL_COMMUNITY): Payer: BLUE CROSS/BLUE SHIELD | Attending: Cardiovascular Disease

## 2018-06-06 DIAGNOSIS — R011 Cardiac murmur, unspecified: Secondary | ICD-10-CM

## 2018-06-06 DIAGNOSIS — Z8249 Family history of ischemic heart disease and other diseases of the circulatory system: Secondary | ICD-10-CM | POA: Insufficient documentation

## 2018-06-06 DIAGNOSIS — I429 Cardiomyopathy, unspecified: Secondary | ICD-10-CM | POA: Insufficient documentation

## 2018-06-06 DIAGNOSIS — I071 Rheumatic tricuspid insufficiency: Secondary | ICD-10-CM | POA: Insufficient documentation

## 2018-06-06 DIAGNOSIS — R079 Chest pain, unspecified: Secondary | ICD-10-CM | POA: Insufficient documentation

## 2018-07-05 ENCOUNTER — Encounter: Payer: Self-pay | Admitting: Internal Medicine

## 2018-07-05 DIAGNOSIS — Z5181 Encounter for therapeutic drug level monitoring: Secondary | ICD-10-CM

## 2018-07-05 DIAGNOSIS — E7849 Other hyperlipidemia: Secondary | ICD-10-CM

## 2018-07-05 DIAGNOSIS — R7302 Impaired glucose tolerance (oral): Secondary | ICD-10-CM

## 2018-07-05 DIAGNOSIS — Z79899 Other long term (current) drug therapy: Secondary | ICD-10-CM

## 2018-07-10 ENCOUNTER — Other Ambulatory Visit: Payer: Self-pay | Admitting: Internal Medicine

## 2018-07-10 ENCOUNTER — Other Ambulatory Visit: Payer: BLUE CROSS/BLUE SHIELD | Admitting: Internal Medicine

## 2018-07-10 ENCOUNTER — Other Ambulatory Visit: Payer: BLUE CROSS/BLUE SHIELD | Admitting: *Deleted

## 2018-07-10 DIAGNOSIS — Z79899 Other long term (current) drug therapy: Secondary | ICD-10-CM

## 2018-07-10 DIAGNOSIS — R079 Chest pain, unspecified: Secondary | ICD-10-CM

## 2018-07-10 DIAGNOSIS — Z5181 Encounter for therapeutic drug level monitoring: Secondary | ICD-10-CM

## 2018-07-10 DIAGNOSIS — E7849 Other hyperlipidemia: Secondary | ICD-10-CM

## 2018-07-10 DIAGNOSIS — R7302 Impaired glucose tolerance (oral): Secondary | ICD-10-CM

## 2018-07-10 DIAGNOSIS — R011 Cardiac murmur, unspecified: Secondary | ICD-10-CM

## 2018-07-11 ENCOUNTER — Encounter: Payer: Self-pay | Admitting: Internal Medicine

## 2018-07-11 ENCOUNTER — Ambulatory Visit (INDEPENDENT_AMBULATORY_CARE_PROVIDER_SITE_OTHER): Payer: BLUE CROSS/BLUE SHIELD | Admitting: Internal Medicine

## 2018-07-11 VITALS — BP 118/72 | HR 74 | Temp 98.2°F | Ht 65.5 in | Wt 189.0 lb

## 2018-07-11 DIAGNOSIS — Z8709 Personal history of other diseases of the respiratory system: Secondary | ICD-10-CM

## 2018-07-11 DIAGNOSIS — Z87898 Personal history of other specified conditions: Secondary | ICD-10-CM | POA: Diagnosis not present

## 2018-07-11 DIAGNOSIS — E7800 Pure hypercholesterolemia, unspecified: Secondary | ICD-10-CM | POA: Diagnosis not present

## 2018-07-11 DIAGNOSIS — Z8249 Family history of ischemic heart disease and other diseases of the circulatory system: Secondary | ICD-10-CM | POA: Diagnosis not present

## 2018-07-11 DIAGNOSIS — F411 Generalized anxiety disorder: Secondary | ICD-10-CM

## 2018-07-11 LAB — BASIC METABOLIC PANEL
BUN/Creatinine Ratio: 20 (ref 9–23)
BUN: 13 mg/dL (ref 6–24)
CO2: 26 mmol/L (ref 20–29)
Calcium: 9.5 mg/dL (ref 8.7–10.2)
Chloride: 101 mmol/L (ref 96–106)
Creatinine, Ser: 0.65 mg/dL (ref 0.57–1.00)
GFR calc Af Amer: 113 mL/min/{1.73_m2} (ref >59)
GFR calc non Af Amer: 98 mL/min/{1.73_m2} (ref >59)
Glucose: 100 mg/dL — ABNORMAL HIGH (ref 65–99)
Potassium: 4.2 mmol/L (ref 3.5–5.2)
Sodium: 141 mmol/L (ref 134–144)

## 2018-07-11 LAB — LIPID PANEL
Cholesterol: 191 mg/dL (ref <200)
HDL: 90 mg/dL (ref >50)
LDL Cholesterol (Calc): 84 mg/dL (calc)
Non-HDL Cholesterol (Calc): 101 mg/dL (calc) (ref <130)
Total CHOL/HDL Ratio: 2.1 (calc) (ref <5.0)
Triglycerides: 78 mg/dL (ref <150)

## 2018-07-11 LAB — HEPATIC FUNCTION PANEL
AG Ratio: 1.6 (calc) (ref 1.0–2.5)
ALT: 26 U/L (ref 6–29)
AST: 20 U/L (ref 10–35)
Albumin: 4.3 g/dL (ref 3.6–5.1)
Alkaline phosphatase (APISO): 70 U/L (ref 33–130)
Bilirubin, Direct: 0.1 mg/dL (ref 0.0–0.2)
Globulin: 2.7 g/dL (calc) (ref 1.9–3.7)
Indirect Bilirubin: 0.4 mg/dL (calc) (ref 0.2–1.2)
Total Bilirubin: 0.5 mg/dL (ref 0.2–1.2)
Total Protein: 7 g/dL (ref 6.1–8.1)

## 2018-07-11 LAB — HEMOGLOBIN A1C
Hgb A1c MFr Bld: 5.6 % of total Hgb (ref <5.7)
Mean Plasma Glucose: 114 (calc)
eAG (mmol/L): 6.3 (calc)

## 2018-07-11 LAB — MICROALBUMIN / CREATININE URINE RATIO
Creatinine, Urine: 103 mg/dL (ref 20–275)
Microalb Creat Ratio: 2 mcg/mg creat (ref <30)
Microalb, Ur: 0.2 mg/dL

## 2018-07-12 ENCOUNTER — Ambulatory Visit (HOSPITAL_COMMUNITY): Admission: RE | Admit: 2018-07-12 | Payer: BLUE CROSS/BLUE SHIELD | Source: Ambulatory Visit

## 2018-07-12 ENCOUNTER — Ambulatory Visit (HOSPITAL_COMMUNITY)
Admission: RE | Admit: 2018-07-12 | Discharge: 2018-07-12 | Disposition: A | Discharge status: Home / Self Care | Payer: BLUE CROSS/BLUE SHIELD | Source: Ambulatory Visit | Attending: Cardiology | Admitting: Cardiology

## 2018-07-12 ENCOUNTER — Encounter (HOSPITAL_COMMUNITY): Payer: Self-pay

## 2018-07-12 DIAGNOSIS — Z8249 Family history of ischemic heart disease and other diseases of the circulatory system: Secondary | ICD-10-CM | POA: Diagnosis present

## 2018-07-12 DIAGNOSIS — R079 Chest pain, unspecified: Secondary | ICD-10-CM

## 2018-07-12 MED ORDER — NITROGLYCERIN 0.4 MG SL SUBL
0.8000 mg | SUBLINGUAL_TABLET | SUBLINGUAL | Status: DC | PRN
  Administered 2018-07-12: 0.8 mg via SUBLINGUAL
  Filled 2018-07-12 (×2): qty 25

## 2018-07-12 MED ORDER — NITROGLYCERIN 0.4 MG SL SUBL
SUBLINGUAL_TABLET | SUBLINGUAL | Status: AC
  Administered 2018-07-12: 0.8 mg via SUBLINGUAL
  Filled 2018-07-12: qty 2

## 2018-07-12 MED ORDER — IOPAMIDOL (ISOVUE-370) INJECTION 76%
INTRAVENOUS | Status: AC
  Administered 2018-07-12: 10:00:00
  Filled 2018-07-12: qty 100

## 2018-07-12 MED ORDER — METOPROLOL TARTRATE 5 MG/5ML IV SOLN
INTRAVENOUS | Status: AC
  Filled 2018-07-12: qty 20

## 2018-07-12 MED ORDER — METOPROLOL TARTRATE 5 MG/5ML IV SOLN
5.0000 mg | INTRAVENOUS | Status: DC | PRN
  Filled 2018-07-12: qty 5

## 2018-07-12 NOTE — Progress Notes (Signed)
Patient tolerated ct without incident. Drank a cup of coffee and water after test. Ambulatory steady gait to exit.

## 2018-07-13 ENCOUNTER — Telehealth: Payer: Self-pay | Admitting: Cardiology

## 2018-07-13 ENCOUNTER — Encounter: Payer: Self-pay | Admitting: *Deleted

## 2018-07-13 DIAGNOSIS — Z79899 Other long term (current) drug therapy: Secondary | ICD-10-CM

## 2018-07-13 DIAGNOSIS — E785 Hyperlipidemia, unspecified: Secondary | ICD-10-CM

## 2018-07-13 MED ORDER — ROSUVASTATIN CALCIUM 20 MG PO TABS: 20.0000 mg | ORAL_TABLET | Freq: Every day | ORAL | 3 refills | Status: DC

## 2018-07-13 NOTE — Telephone Encounter (Signed)
Reviewed CT results and recommendations with patient who states understanding.  Crestor RX to be sent into [facility] and she will have lipid/alt 09/20/18.

## 2018-07-13 NOTE — Telephone Encounter (Signed)
New Message   Pt calling, wanting to know if someone would go over her CT results with her

## 2018-07-14 ENCOUNTER — Encounter (INDEPENDENT_AMBULATORY_CARE_PROVIDER_SITE_OTHER): Payer: Self-pay

## 2018-07-15 NOTE — Patient Instructions (Signed)
It was a pleasure to see you today.  Congratulations on your new exercise efforts.  Follow-up with cardiologist this week regarding calcium scoring.  We have booked you for physical exam in December.
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
## 2018-07-17 ENCOUNTER — Telehealth: Payer: Self-pay | Admitting: Allergy

## 2018-07-17 NOTE — Telephone Encounter (Signed)
Patient states that she has had a productive cough with yellow sputum for 3-4 days. Denies chills. Possible body aches, but states that could have been related to the CT scan she had on Wednesday. She has increased all medications per the asthma action plan. Please advise and thank you.

## 2018-07-17 NOTE — Telephone Encounter (Signed)
I have advised patient of this information. She will call back in 2-3 days if still no better so that we can send in Zpac.

## 2018-07-17 NOTE — Telephone Encounter (Signed)
Patient is coughing up yellow and is afraid it is going to affect her asthma. She would like to know what to do.

## 2018-07-17 NOTE — Telephone Encounter (Signed)
She has increased her Qvar to 3 puffs three times a day and would advise to use her albuterol every 4 hours while awake.   She can also take Mucinex DM 600-1200mg  daily with plenty of water to help thin mucus and help with cough.  Given her history of pretty bad PNA earlier this year would have her take a Z-pak especially if symptoms are not improving in next 2-3 days.  She should continue to monitor for fever.

## 2018-07-21 ENCOUNTER — Telehealth: Payer: Self-pay | Admitting: *Deleted

## 2018-07-21 MED ORDER — AZITHROMYCIN 250 MG PO TABS: ORAL_TABLET | ORAL | 0 refills | Status: DC

## 2018-07-21 NOTE — Telephone Encounter (Signed)
Patient called in to give update on how she was feeling.  Patient is not feeling any better.  She has a productive cough on occasion.  Patient is not sure if she has fever or not.  Her thermometer has been packed up with her house being remodeled.  Patient states has been taking her medications as directed.  Patient is leaving this morning at 11 to go out of town and would like Z[facility]Pak called in to [facility] [address].  Patient was instructed to call the office if she does not improve with antibiotic and she will need office visit.  Patient voiced understanding.  Dr. Delorse LekPadgett was notified and okay given to send in Z-Pak.

## 2018-07-26 ENCOUNTER — Encounter: Payer: Self-pay | Admitting: Allergy

## 2018-07-26 ENCOUNTER — Ambulatory Visit (INDEPENDENT_AMBULATORY_CARE_PROVIDER_SITE_OTHER): Payer: BLUE CROSS/BLUE SHIELD | Admitting: Allergy

## 2018-07-26 ENCOUNTER — Telehealth: Payer: Self-pay

## 2018-07-26 ENCOUNTER — Ambulatory Visit: Admission: RE | Admit: 2018-07-26 | Payer: BLUE CROSS/BLUE SHIELD | Source: Ambulatory Visit

## 2018-07-26 VITALS — BP 120/68 | HR 70 | Temp 97.8°F | Resp 17

## 2018-07-26 DIAGNOSIS — J4541 Moderate persistent asthma with (acute) exacerbation: Secondary | ICD-10-CM

## 2018-07-26 DIAGNOSIS — R059 Cough, unspecified: Secondary | ICD-10-CM

## 2018-07-26 DIAGNOSIS — R05 Cough: Secondary | ICD-10-CM

## 2018-07-26 DIAGNOSIS — J3089 Other allergic rhinitis: Secondary | ICD-10-CM | POA: Diagnosis not present

## 2018-07-26 DIAGNOSIS — R0602 Shortness of breath: Secondary | ICD-10-CM | POA: Diagnosis not present

## 2018-07-26 DIAGNOSIS — K219 Gastro-esophageal reflux disease without esophagitis: Secondary | ICD-10-CM

## 2018-07-26 MED ORDER — AZELASTINE HCL 0.1 % NA SOLN: 2.0000 | Freq: Two times a day (BID) | NASAL | 5 refills | Status: DC

## 2018-07-26 MED ORDER — BUDESONIDE-FORMOTEROL FUMARATE 160-4.5 MCG/ACT IN AERO: 2.0000 | INHALATION_SPRAY | Freq: Two times a day (BID) | RESPIRATORY_TRACT | 5 refills | Status: DC

## 2018-07-26 NOTE — Telephone Encounter (Signed)
Called and left message for patient to call office in regards to her CT scan. Patient has an appt 07/28/2018 at 4:15 in the radiology department at [place].

## 2018-07-26 NOTE — Progress Notes (Signed)
Follow-up Note  RE: Paula Bradley MRN: [id number] DOB: 12-25-1961 Date of Office Visit: 07/26/2018   History of present illness: Paula Bradley is a 57 y.o. female presenting today for sick visit today.  She was last seen in the office on 05/26/18 by myself.  She states she was doing ok until mid July after having a CT coronary with CTA due to chest pain.  She state she was premedicated with prednisone, benadryl and nitro.  Around the same she states she was remodeling her bathroom and living in the home during the remodeling.  The remodeling is completed now and she has had a cleaner thoroughly clean the home.  She developed a cough that is sometimes productive but mostly dry. She is coughing through the day and coughing to the point that she does not want to go out.  I did have her complete an azithromycin course last week due to persistent cough however this did not help.  She also states she is wheezing as well.  She is using her Qvar 3 puffs 3 times day which is has action plan dosing.  She is needing to use xopenex at least daily if not multiple times a day.  She also states her throat is sore and does note post-nasal drip.  She denies any fevers, sweats or chills.  She had a rather prolonged PNA earlier this year and has not quite recovered completely from that.   Her last CXR from Feb 2019 showed mild interstitial prominence reflecting acute bronchitic changes.     Review of systems: Review of Systems  Constitutional: Negative for chills, fever and malaise/fatigue.  HENT: Positive for congestion and sore throat. Negative for ear discharge, ear pain, nosebleeds and sinus pain.   Eyes: Negative for pain, discharge and redness.  Respiratory: Positive for cough, sputum production, shortness of breath and wheezing. Negative for hemoptysis.   Cardiovascular: Positive for chest pain.  Gastrointestinal: Negative for abdominal pain, constipation, diarrhea, nausea and vomiting.    Musculoskeletal: Negative for joint pain.  Skin: Negative for itching and rash.  Neurological: Negative for headaches.    All other systems negative unless noted above in HPI  Past medical/social/surgical/family history have been reviewed and are unchanged unless specifically indicated below.  No changes  Medication List: Allergies as of 07/26/2018      Reactions   Aspirin Hives   Fluticasone-salmeterol Hives   REACTION: hives REACTION: hives   Scallops [shellfish Allergy] Hives      Medication List        Accurate as of 07/26/18  2:50 PM. Always use your most recent med list.          azelastine 0.1 % nasal spray Commonly known as:  ASTELIN Place 2 sprays into both nostrils 2 (two) times daily. Use in each nostril as directed   beclomethasone 80 MCG/ACT inhaler Commonly known as:  QVAR REDIHALER Inhale 2 puffs twice daily. Increase to 3 puffs 3 times daily for asthma flare.   Biotin 1000 MCG tablet Take 1,000 mcg by mouth daily.   budesonide-formoterol 160-4.5 MCG/ACT inhaler Commonly known as:  SYMBICORT Inhale 2 puffs into the lungs 2 (two) times daily.   cetirizine 10 MG tablet Commonly known as:  ZYRTEC Take 10 mg by mouth daily as needed.   clonazePAM 1 MG tablet Commonly known as:  KLONOPIN TK 1 T PO BID   co-enzyme Q-10 30 MG capsule Take 30 mg by mouth daily.   FISH  OIL PO Take 1 capsule by mouth 2 (two) times daily.   levalbuterol 1.25 MG/3ML nebulizer solution Commonly known as:  XOPENEX Take 1.25 mg by nebulization every 6 (six) hours as needed for wheezing.   levalbuterol 45 MCG/ACT inhaler Commonly known as:  XOPENEX HFA Inhale 2 puffs into the lungs every 6 (six) hours as needed for wheezing.   MAGNESIUM CARBONATE PO Take 1 tablet by mouth daily.   montelukast 10 MG tablet Commonly known as:  SINGULAIR Take 1 tablet (10 mg total) by mouth daily.   multivitamin tablet Take 1 tablet by mouth daily.   pantoprazole 40 MG  tablet Commonly known as:  PROTONIX TAKE 1 TABLET BY MOUTH EVERY DAY FOR REFLUX   Probiotic 250 MG Caps Take 1 capsule by mouth daily.   RANITIDINE HCL PO Take 300 mg by mouth at bedtime.   rosuvastatin 20 MG tablet Commonly known as:  CRESTOR Take 1 tablet (20 mg total) by mouth daily.   sertraline 50 MG tablet Commonly known as:  ZOLOFT TAKE 1 TABLET(50 MG) BY MOUTH DAILY   TART CHERRY ADVANCED Caps Take 1 capsule by mouth daily.   TURMERIC PO Take 1 capsule by mouth 2 (two) times daily.   Vitamin D 2000 units tablet Take 2,000 Units by mouth daily.       Known medication allergies: Allergies  Allergen Reactions  . Aspirin Hives  . Fluticasone-Salmeterol Hives    REACTION: hives REACTION: hives  . Scallops [Shellfish Allergy] Hives     Physical examination: Blood pressure 120/68, pulse 70, temperature 97.8 F (36.6 C), resp. rate 17, SpO2 94 %.  General: Alert, interactive, in no acute distress. HEENT: PERRLA, TMs pearly gray, turbinates mildly edematous with clear discharge, post-pharynx mildly erythematous. Neck: Supple without lymphadenopathy. Lungs: Decreased breath sounds bilaterally without wheezing, rhonchi or rales. {no increased work of breathing.  Aeration improved following BD.   CV: Normal S1, S2 without murmurs. Abdomen: Nondistended, nontender. Skin: Warm and dry, without lesions or rashes. Extremities:  No clubbing, cyanosis or edema. Neuro:   Grossly intact.  Diagnositics/Labs: Labs:  Component     Latest Ref Rng & Units 07/10/2018  Glucose     65 - 99 mg/dL 161 (H)  BUN     6 - 24 mg/dL 13  Creatinine     0.96 - 1.00 mg/dL 0.45  GFR, Est Non African American     >59 mL/min/1.73 98  GFR, Est African American     >59 mL/min/1.73 113  BUN/Creatinine Ratio     9 - 23 20  Sodium     134 - 144 mmol/L 141  Potassium     3.5 - 5.2 mmol/L 4.2  Chloride     96 - 106 mmol/L 101  CO2     20 - 29 mmol/L 26  Calcium     8.7 - 10.2  mg/dL 9.5  Total Protein     6.1 - 8.1 g/dL 7.0  Albumin MSPROF     3.6 - 5.1 g/dL 4.3  Globulin     1.9 - 3.7 g/dL (calc) 2.7  AG Ratio     1.0 - 2.5 (calc) 1.6  Total Bilirubin     0.2 - 1.2 mg/dL 0.5  Bilirubin, Direct     0.0 - 0.2 mg/dL 0.1  Indirect Bilirubin     0.2 - 1.2 mg/dL (calc) 0.4  Alkaline phosphatase (APISO)     33 - 130 U/L 70  AST  10 - 35 U/L 20  ALT     6 - 29 U/L 26    Spirometry: FEV1: 1.76L 67%, FVC: 2.05L 62% with no significant improvement following BD  Assessment and plan:   Moderate persistent asthma with cough component- again with return of cough and wheezing symptoms.  Do believe that she has a large insult with inhalent material from remodeling of bathroom that has set this current episode.  Given her history of PNA that had a prolonged course will obtain CT chest wo contrast at this time.  She has 2 CXR this year showing bronchitic changes.   Allergic rhinitis - increase post-nasal drainage also likely contributing to cough as well LPRD   1. Stop Qvar and increase to Symbicort 160mg  2 puffs twice a day (will do prior authorization - sample provided)  2. Continue Protonix daily.  May use ranitidine as well if needed for control of reflux    3. Continue montelukast 10 mg one tablet one time per day  4.  use Xopenex 2 puffs every 4-6 hours as needed for cough/wheeze/shortness of breath/chest tightness.   May use 15-20 minutes prior to exercise/activity if needed.  Monitor frequency of use.    5. Zyrtec or Allergra daily   6. For nasal drainage start nasal antihistamine, Astelin 2 sprays each nostril 1-2 times a day.  Use with proper nasal spray technique (point tip of spray toward ear of same side nostril).   Use nasal steroid spray like Nasocort, Flonase for nasal congestion 2 sprays each nostril daily if needed (use for 1-2 weeks at a time before stopping once symptoms improve).    7. Will obtain chest CT to further image lungs given  history of prolonged PNA with continued cough and wheeze symptoms.    8. Return to clinic in 3 months or sooner if needed  I appreciate the opportunity to take part in Lyn[place]'s care. Please do not hesitate to contact me with questions.  Sincerely,   Margo AyeShaylar Padgett, MD Allergy/Immunology [facility]

## 2018-07-26 NOTE — Patient Instructions (Signed)
  1. Stop Qvar and increase to Symbicort 160mg  2 puffs twice a day (will do prior authorization - sample provided)  2. Continue Protonix daily.  May use ranitidine as well if needed for control of reflux    3. Continue montelukast 10 mg one tablet one time per day  4.  use Xopenex 2 puffs every 4-6 hours as needed for cough/wheeze/shortness of breath/chest tightness.   May use 15-20 minutes prior to exercise/activity if needed.  Monitor frequency of use.    5. Zyrtec or Allergra daily   6. For nasal drainage start nasal antihistamine, Astelin 2 sprays each nostril 1-2 times a day.  Use with proper nasal spray technique (point tip of spray toward ear of same side nostril).   Use nasal steroid spray like Nasocort, Flonase for nasal congestion 2 sprays each nostril daily if needed (use for 1-2 weeks at a time before stopping once symptoms improve).    7. Will obtain chest CT to further image lungs given history of prolonged PNA with continued cough and wheeze symptoms.    8. Return to clinic in 3 months or sooner if needed

## 2018-07-27 NOTE — Telephone Encounter (Signed)
Spoke with patient and informed her of her appt. Patient stated she may need to call to reschedule because she is suppose to go out of town tomorrow.

## 2018-07-28 ENCOUNTER — Ambulatory Visit (HOSPITAL_COMMUNITY)
Admission: RE | Admit: 2018-07-28 | Discharge: 2018-07-28 | Disposition: A | Discharge status: Home / Self Care | Payer: BLUE CROSS/BLUE SHIELD | Source: Ambulatory Visit | Attending: Allergy | Admitting: Allergy

## 2018-07-28 DIAGNOSIS — I700 Atherosclerosis of aorta: Secondary | ICD-10-CM | POA: Diagnosis not present

## 2018-07-28 DIAGNOSIS — R05 Cough: Secondary | ICD-10-CM | POA: Diagnosis not present

## 2018-07-28 DIAGNOSIS — R059 Cough, unspecified: Secondary | ICD-10-CM

## 2018-08-04 ENCOUNTER — Encounter: Payer: Self-pay | Admitting: Internal Medicine

## 2018-08-10 DIAGNOSIS — N200 Calculus of kidney: Secondary | ICD-10-CM | POA: Insufficient documentation

## 2018-08-22 ENCOUNTER — Ambulatory Visit (INDEPENDENT_AMBULATORY_CARE_PROVIDER_SITE_OTHER): Payer: BLUE CROSS/BLUE SHIELD | Admitting: Podiatry

## 2018-08-22 ENCOUNTER — Ambulatory Visit (INDEPENDENT_AMBULATORY_CARE_PROVIDER_SITE_OTHER): Payer: BLUE CROSS/BLUE SHIELD

## 2018-08-22 DIAGNOSIS — M778 Other enthesopathies, not elsewhere classified: Secondary | ICD-10-CM

## 2018-08-22 DIAGNOSIS — M779 Enthesopathy, unspecified: Secondary | ICD-10-CM | POA: Diagnosis not present

## 2018-08-22 DIAGNOSIS — M79672 Pain in left foot: Secondary | ICD-10-CM

## 2018-08-22 DIAGNOSIS — J40 Bronchitis, not specified as acute or chronic: Secondary | ICD-10-CM | POA: Insufficient documentation

## 2018-08-22 MED ORDER — DICLOFENAC SODIUM 1 % TD GEL: 2.0000 g | Freq: Four times a day (QID) | TRANSDERMAL | 2 refills | Status: DC

## 2018-08-26 NOTE — Progress Notes (Signed)
Subjective:   Patient ID: Paula Bradley, female   DOB: 57 y.o.   MRN: [id number]   HPI 57 year old female presents the office today for concerns of pain to the top of her left foot was going to her ankle.  Is been chronic for several years but she had acute worsening of pain over the last week.  She does occasionally wake up at nighttime with discomfort.  She has some mild swelling to the area.  Some recent injury or trauma she can recall she denies any change or increase in activity level.  She said no recent treatment.  No other concerns.   Review of Systems  All other systems reviewed and are negative.  Past Medical History:  Diagnosis Date  . Allergic rhinitis   . Anxiety   . Anxiety and depression   . Asthma   . Depression   . Hyperlipidemia   . Kidney stone   . Menopause     Past Surgical History:  Procedure Laterality Date  . CESAREAN SECTION  1996  . COLPOSCOPY    . DILATION AND CURETTAGE OF UTERUS    . TONSILLECTOMY  1985     Current Outpatient Medications:  .  beclomethasone (QVAR) 80 MCG/ACT inhaler, Inhale two puffs twice daily to prevent cough or wheeze, Disp: , Rfl:  .  cyclobenzaprine (FLEXERIL) 10 MG tablet, , Disp: , Rfl:  .  etodolac (LODINE) 400 MG tablet, TK 1 T PO TID, Disp: , Rfl:  .  montelukast (SINGULAIR) 10 MG tablet, TAKE 1 TABLET BY MOUTH DAILY, Disp: , Rfl:  .  pantoprazole (PROTONIX) 40 MG tablet, TAKE 1 TABLET(40 MG) BY MOUTH DAILY, Disp: , Rfl:  .  predniSONE (STERAPRED UNI-PAK 21 TAB) 10 MG (21) TBPK tablet, TK UTD, Disp: , Rfl:  .  sertraline (ZOLOFT) 50 MG tablet, TAKE 1 TABLET(50 MG) BY MOUTH DAILY, Disp: , Rfl:  .  simvastatin (ZOCOR) 20 MG tablet, , Disp: , Rfl:  .  Acetaminophen-Codeine (TYLENOL/CODEINE #3) 300-30 MG tablet, Tylenol-Codeine #3 300 mg-30 mg tablet  TAKE 1 TABLET EVERY 8 HOURS AS NEEDED FOR PAIN (SEDATION PRECAUTION), Disp: , Rfl:  .  azelastine (ASTELIN) 0.1 % nasal spray, Place 2 sprays into both nostrils 2 (two)  times daily. Use in each nostril as directed, Disp: 30 mL, Rfl: 5 .  beclomethasone (QVAR REDIHALER) 80 MCG/ACT inhaler, Inhale 2 puffs twice daily. Increase to 3 puffs 3 times daily for asthma flare., Disp: 10.6 g, Rfl: 3 .  Biotin 1000 MCG tablet, Take 1,000 mcg by mouth daily. , Disp: , Rfl:  .  budesonide-formoterol (SYMBICORT) 160-4.5 MCG/ACT inhaler, Inhale 2 puffs into the lungs 2 (two) times daily., Disp: 1 Inhaler, Rfl: 5 .  Calcium-Magnesium-Vitamin D 500-250-200 MG-MG-UNIT TABS, Take by mouth., Disp: , Rfl:  .  cetirizine (ZYRTEC) 10 MG tablet, Take 10 mg by mouth daily as needed. , Disp: , Rfl:  .  Cholecalciferol (VITAMIN D) 2000 UNITS tablet, Take 2,000 Units by mouth daily.  , Disp: , Rfl:  .  clonazePAM (KLONOPIN) 1 MG tablet, TK 1 T PO BID, Disp: , Rfl: 5 .  co-enzyme Q-10 30 MG capsule, Take 30 mg by mouth daily. , Disp: , Rfl:  .  diclofenac sodium (VOLTAREN) 1 % GEL, Apply 2 g topically 4 (four) times daily. Rub into affected area of foot 2 to 4 times daily, Disp: 100 g, Rfl: 2 .  levalbuterol (XOPENEX HFA) 45 MCG/ACT inhaler, Inhale 2 puffs into the  lungs every 6 (six) hours as needed for wheezing., Disp: 15 g, Rfl: 1 .  levalbuterol (XOPENEX) 1.25 MG/3ML nebulizer solution, Take 1.25 mg by nebulization every 6 (six) hours as needed for wheezing., Disp: 150 mL, Rfl: 1 .  MAGNESIUM CARBONATE PO, Take 1 tablet by mouth daily. , Disp: , Rfl:  .  Misc Natural Products (TART CHERRY ADVANCED) CAPS, Take 1 capsule by mouth daily., Disp: , Rfl:  .  montelukast (SINGULAIR) 10 MG tablet, Take 1 tablet (10 mg total) by mouth daily., Disp: 30 tablet, Rfl: 3 .  Multiple Vitamin (MULTIVITAMIN) tablet, Take 1 tablet by mouth daily., Disp: , Rfl:  .  Omega-3 Fatty Acids (FISH OIL PO), Take 1 capsule by mouth 2 (two) times daily. , Disp: , Rfl:  .  Omega-3 Fatty Acids (FISH OIL) 1000 MG CAPS, Take by mouth., Disp: , Rfl:  .  pantoprazole (PROTONIX) 40 MG tablet, TAKE 1 TABLET BY MOUTH EVERY DAY  FOR REFLUX, Disp: 90 tablet, Rfl: 0 .  RANITIDINE HCL PO, Take 300 mg by mouth at bedtime. , Disp: , Rfl:  .  rosuvastatin (CRESTOR) 20 MG tablet, Take 1 tablet (20 mg total) by mouth daily., Disp: 90 tablet, Rfl: 3 .  Saccharomyces boulardii (PROBIOTIC) 250 MG CAPS, Take 1 capsule by mouth daily., Disp: , Rfl:  .  sertraline (ZOLOFT) 50 MG tablet, TAKE 1 TABLET(50 MG) BY MOUTH DAILY, Disp: 90 tablet, Rfl: 3 .  TURMERIC PO, Take 1 capsule by mouth 2 (two) times daily. , Disp: , Rfl:   Allergies  Allergen Reactions  . Aspirin Hives  . Fluticasone-Salmeterol Hives    REACTION: hives REACTION: hives  . Scallops [Shellfish Allergy] Hives         Objective:  Physical Exam  General: AAO x3, NAD  Dermatological: Skin is warm, dry and supple bilateral. Nails x 10 are well manicured; remaining integument appears unremarkable at this time. There are no open sores, no preulcerative lesions, no rash or signs of infection present.  Vascular: Dorsalis Pedis artery and Posterior Tibial artery pedal pulses are 2/4 bilateral with immedate capillary fill time. Pedal hair growth present. No varicosities and no lower extremity edema present bilateral. There is no pain with calf compression, swelling, warmth, erythema.   Neruologic: Grossly intact via light touch bilateral.  Protective threshold with Semmes Wienstein monofilament intact to all pedal sites bilateral.  Negative Tinel sign.  Musculoskeletal: There is tenderness palpation of the distal aspect of the left midfoot at approximately Lisfranc joint.  There is no specific area pinpoint tenderness there is no pain to vibratory sensation.  Minimal swelling there is no erythema or warmth.  No pain to the ankle joint or along the tib-fib, proximal leg.  No pain to the forefoot on the digits or metatarsophalangeal joints.  Muscular strength 5/5 in all groups tested bilateral.  Gait: Unassisted, Nonantalgic.       Assessment:   Left foot  capsulitis    Plan:  -Treatment options discussed including all alternatives, risks, and complications -Etiology of symptoms were discussed -X-rays were obtained reviewed.  No definitive evidence of acute fracture or stress fracture was identified today. -Steroid injection was performed.  See procedure note below. -Although I doubt she has a stress fracture she has acute worsening pain.  On opening jars cam boot today.  This was dispensed for her.  Discussed ice and elevation. Prescribed mobic. Discussed side effects of the medication and directed to stop if any are to occur  and call the office.   Procedure: Injection left midfoot Discussed alternatives, risks, complications and verbal consent was obtained.  Location: Left midfoot Skin Prep: Alcohol. Injectate: 0.5cc 0.5% marcaine plain, 0.5 cc 2% lidocaine plain and, 1 cc kenalog 10. Disposition: Patient tolerated procedure well. Injection site dressed with a band-aid.  Post-injection care was discussed and return precautions discussed.   *If continues to have pain next appointment get new x-rays  Paula Bradley DPM

## 2018-08-31 ENCOUNTER — Encounter: Payer: Self-pay | Admitting: Internal Medicine

## 2018-08-31 ENCOUNTER — Ambulatory Visit (INDEPENDENT_AMBULATORY_CARE_PROVIDER_SITE_OTHER): Payer: BLUE CROSS/BLUE SHIELD | Admitting: Internal Medicine

## 2018-08-31 VITALS — BP 130/80 | HR 64 | Temp 98.2°F | Ht 65.5 in | Wt 188.0 lb

## 2018-08-31 DIAGNOSIS — R1084 Generalized abdominal pain: Secondary | ICD-10-CM

## 2018-08-31 DIAGNOSIS — R1011 Right upper quadrant pain: Secondary | ICD-10-CM

## 2018-08-31 DIAGNOSIS — M533 Sacrococcygeal disorders, not elsewhere classified: Secondary | ICD-10-CM | POA: Diagnosis not present

## 2018-08-31 DIAGNOSIS — M25552 Pain in left hip: Secondary | ICD-10-CM

## 2018-08-31 LAB — CBC WITH DIFFERENTIAL/PLATELET
Basophils Absolute: 39 cells/uL (ref 0–200)
Basophils Relative: 0.5 %
Eosinophils Absolute: 100 cells/uL (ref 15–500)
Eosinophils Relative: 1.3 %
HCT: 40.6 % (ref 35.0–45.0)
Hemoglobin: 13.7 g/dL (ref 11.7–15.5)
Lymphs Abs: 1471 cells/uL (ref 850–3900)
MCH: 31.1 pg (ref 27.0–33.0)
MCHC: 33.7 g/dL (ref 32.0–36.0)
MCV: 92.1 fL (ref 80.0–100.0)
MPV: 10.6 fL (ref 7.5–12.5)
Monocytes Relative: 8.5 %
Neutro Abs: 5436 cells/uL (ref 1500–7800)
Neutrophils Relative %: 70.6 %
Platelets: 267 10*3/uL (ref 140–400)
RBC: 4.41 10*6/uL (ref 3.80–5.10)
RDW: 12.2 % (ref 11.0–15.0)
Total Lymphocyte: 19.1 %
WBC mixed population: 655 cells/uL (ref 200–950)
WBC: 7.7 10*3/uL (ref 3.8–10.8)

## 2018-08-31 LAB — COMPLETE METABOLIC PANEL WITH GFR
AG Ratio: 1.6 (calc) (ref 1.0–2.5)
ALT: 24 U/L (ref 6–29)
AST: 21 U/L (ref 10–35)
Albumin: 4.4 g/dL (ref 3.6–5.1)
Alkaline phosphatase (APISO): 70 U/L (ref 33–130)
BUN: 16 mg/dL (ref 7–25)
CO2: 28 mmol/L (ref 20–32)
Calcium: 9.2 mg/dL (ref 8.6–10.4)
Chloride: 105 mmol/L (ref 98–110)
Creat: 0.69 mg/dL (ref 0.50–1.05)
GFR, Est African American: 111 mL/min/{1.73_m2} (ref >=60)
GFR, Est Non African American: 96 mL/min/{1.73_m2} (ref >=60)
Globulin: 2.7 g/dL (calc) (ref 1.9–3.7)
Glucose, Bld: 98 mg/dL (ref 65–99)
Potassium: 4.3 mmol/L (ref 3.5–5.3)
Sodium: 142 mmol/L (ref 135–146)
Total Bilirubin: 0.5 mg/dL (ref 0.2–1.2)
Total Protein: 7.1 g/dL (ref 6.1–8.1)

## 2018-08-31 LAB — LIPASE: Lipase: 17 U/L (ref 7–60)

## 2018-08-31 LAB — AMYLASE: Amylase: 40 U/L (ref 21–101)

## 2018-08-31 LAB — POCT URINALYSIS DIPSTICK
Appearance: NORMAL
Bilirubin, UA: NEGATIVE
Glucose, UA: NEGATIVE
Ketones, UA: NEGATIVE
Leukocytes, UA: NEGATIVE
Nitrite, UA: NEGATIVE
Odor: NORMAL
Protein, UA: NEGATIVE
Spec Grav, UA: 1.01 (ref 1.010–1.025)
Urobilinogen, UA: 0.2 E.U./dL
pH, UA: 6.5 (ref 5.0–8.0)

## 2018-08-31 NOTE — Progress Notes (Signed)
Subjective:    Patient ID: Paula Bradley, female    DOB: March 30, 1961, 57 y.o.   MRN: [id number]  HPI 57 year old Female was seen in mid August at an urgent care in [facility] for an episode of recurrent right upper quadrant abdominal pain.  This is her third episode since 2015.  In the Fall 2018, she had an episode in [place] and came home for evaluation.  Was seen here at [facility] at the time.  Records indicate she had also chest and epigastric pain.  At the time, had mild epigastric and right upper quadrant tenderness.  Had chest x-ray and ultrasound of the abdomen.  No gallstones were noted.  Fatty liver was noted.  It was thought she might have a possible small stone in the right kidney but no hydronephrosis.  She subsequently saw a Urologist who did not think she had a kidney stone.  During that emergency department evaluation, lipase was normal.  Urinalysis showed no hematuria.  She was fine again until recent beach vacation in August where she had a lot of family members coming in.  Of note, she also saw Dr. Myrtie Neither, gastroenterologist for evaluation in December 2018 after attack in October 2018 and an upper endoscopy was planned but apparently according to patient she got a note that it was not approved by [employer].  She never went back for follow-up.  She had a similar episode of right upper quadrant pain in 2015 and had a negative hepatobiliary scan.    She had a colonoscopy by Dr. Juanda Chance in 2011 and one small polyp was removed that was benign and not adenomatous.  Patient says these attacks are rather severe with right upper quadrant pain and she cannot get any relief.  Says the pain radiates around to her right side and back area.  She has no urinary symptoms.  No documented fever or chills with these episodes.      Review of Systems fell on dock at beach 2 weeks ago complaining of pain in her left lateral hip and thigh and sacral  area     Objective:   Physical Exam She has point tenderness over her left upper lateral thigh.  Abdomen is obese soft nondistended without hepatosplenomegaly masses or tenderness.  She is tender over her sacrum.       Assessment & Plan:  Recurrent episodes x3 of right upper quadrant abdominal pain with negative hepatobiliary scan in 2015 with initial episode at the beach near [place] and recent ultrasound in late 2018 suggesting kidney stone.  However, urologist did not think she had stones and urine dipstick had no hematuria at the time of her presentation to [facility] October 2018.  Second episode occurred October 2018 in [place]  Third episode occurred in August.  She was seen at an urgent care in [place] and was thought perhaps to have kidney stone based on ultrasound.  Also has probable left trochanteric bursitis and sacral contusion secondary to fall [place] dock [place] beach a couple of weeks ago.  Sacral contusion  Left trochanteric bursitis  Plan: She will return tomorrow for H. pylori breath test.  She will also obtain x-rays of sacrum and left hip.  I will consider injecting the left trochanteric bursa at that time.  I would like to get CT of the abdomen and pelvis for evaluation of her recurrent abdominal pain.  I think she may have  a calculus cholecystitis but cannot exclude other intra-abdominal pathology such as pancreatic cancer or even occult kidney stones.

## 2018-09-01 ENCOUNTER — Ambulatory Visit
Admission: RE | Admit: 2018-09-01 | Discharge: 2018-09-01 | Disposition: A | Discharge status: Home / Self Care | Payer: BLUE CROSS/BLUE SHIELD | Source: Ambulatory Visit | Attending: Internal Medicine | Admitting: Internal Medicine

## 2018-09-01 ENCOUNTER — Encounter: Payer: Self-pay | Admitting: Internal Medicine

## 2018-09-01 ENCOUNTER — Ambulatory Visit (INDEPENDENT_AMBULATORY_CARE_PROVIDER_SITE_OTHER): Payer: BLUE CROSS/BLUE SHIELD | Admitting: Internal Medicine

## 2018-09-01 VITALS — BP 130/80 | HR 78 | Temp 98.3°F | Ht 65.5 in | Wt 188.0 lb

## 2018-09-01 DIAGNOSIS — M533 Sacrococcygeal disorders, not elsewhere classified: Secondary | ICD-10-CM

## 2018-09-01 DIAGNOSIS — R1011 Right upper quadrant pain: Secondary | ICD-10-CM

## 2018-09-01 DIAGNOSIS — M7062 Trochanteric bursitis, left hip: Secondary | ICD-10-CM | POA: Diagnosis not present

## 2018-09-01 DIAGNOSIS — M25552 Pain in left hip: Secondary | ICD-10-CM

## 2018-09-01 MED ORDER — METHYLPREDNISOLONE ACETATE 80 MG/ML IJ SUSP
80.0000 mg | Freq: Once | INTRAMUSCULAR | Status: AC
  Administered 2018-09-01: 80 mg via INTRAMUSCULAR

## 2018-09-01 NOTE — Patient Instructions (Signed)
We will try to get CT of abdomen and pelvis with contrast proved for recurrent episodes of right upper quadrant abdominal pain.  She will have x-rays of sacrum and left hip and return tomorrow for H. pylori breath test.

## 2018-09-01 NOTE — Patient Instructions (Signed)
H. pylori breath test pending.  Bursa left trochanter injected with Depo-Medrol Xylocaine and Marcaine.  Take Tylenol if needed for sacral pain.  Ordered CT of abdomen and pelvis with contrast.

## 2018-09-01 NOTE — Progress Notes (Signed)
   Subjective:    Patient ID: Paula Bradley, female    DOB: 04/22/1961, 57 y.o.   MRN: [id number]  HPI  Pt saw Dr. Myrtie Neither  In December 2018 regarding RUQ abdominal pain after severe episode in October 2018. He suggested endoscopy and apparently insurance would not approve procedure.  Had Hepatobiliary scan Sept 2015 here in [place] which was negative after being hospitalized at [facility] with RUQ pain.  In October 2018 came home from [place] urgently with recurrent RUQ abdominal pain.[facility]auated at [facility] and was told had possible stone right kidney but no obstruction. Saw urologist and he thought she did not have kidney stones.  Recent episode RUQ pain at her [facility] in August very similar to 2 previous episodes. Ultrasound done in [place] suggested a kidney stone but had blood in urine on U/A.  Also suffered fall at beach on slippery dock and landed on buttocks. Having sacral and hip pain. Xrays today are negative for fracture of sacrum or hip. Tender over left trochanter.  Had CT of chest in August to evaluate cough with history of asthma which was negative. Review of Systems Very anxious    Objective:   Physical Exam  Tender over left trochanter lateral aspect-definite point tenderness. No swelling      Assessment & Plan:  Left trochanteric bursitis-injected with Marcaine Xylocaine and Depo-Medrol  Sacral contusion-Tylenol if needed for pain  Recurrent right upper quadrant abdominal pain-episodic and severe  Plan: Attempt to get CT of the abdomen and pelvis with contast

## 2018-09-04 LAB — H. PYLORI BREATH TEST: H. pylori Breath Test: NOT DETECTED

## 2018-09-05 ENCOUNTER — Encounter: Payer: Self-pay | Admitting: Podiatry

## 2018-09-05 ENCOUNTER — Telehealth: Payer: Self-pay | Admitting: Cardiology

## 2018-09-05 ENCOUNTER — Ambulatory Visit (INDEPENDENT_AMBULATORY_CARE_PROVIDER_SITE_OTHER): Payer: BLUE CROSS/BLUE SHIELD

## 2018-09-05 ENCOUNTER — Ambulatory Visit (INDEPENDENT_AMBULATORY_CARE_PROVIDER_SITE_OTHER): Payer: BLUE CROSS/BLUE SHIELD | Admitting: Podiatry

## 2018-09-05 DIAGNOSIS — M779 Enthesopathy, unspecified: Secondary | ICD-10-CM

## 2018-09-05 DIAGNOSIS — E785 Hyperlipidemia, unspecified: Secondary | ICD-10-CM

## 2018-09-05 DIAGNOSIS — M778 Other enthesopathies, not elsewhere classified: Secondary | ICD-10-CM

## 2018-09-05 DIAGNOSIS — Z789 Other specified health status: Secondary | ICD-10-CM

## 2018-09-05 MED ORDER — METHYLPREDNISOLONE 4 MG PO TBPK: ORAL_TABLET | ORAL | 0 refills | Status: DC

## 2018-09-05 NOTE — Telephone Encounter (Signed)
Pt called to report that since starting the Crestor she has been declining physically. She says that her body aches have become so severe that it is affecting her normal daily living. She denies any other symptoms just body aching all over. I advised her to stop the Crestor and will forward to Dr. Anne Fu for his further recommendations. Pt agrees and will wait for a call back but will call us if anything changes or if any new symptoms present.

## 2018-09-05 NOTE — Telephone Encounter (Signed)
New Message   Pt c/o medication issue:  1. Name of Medication: rosuvastatin (CRESTOR) 20 MG tablet  2. How are you currently taking this medication (dosage and times per day)? Take 1 tablet (20 mg total) by mouth daily  3. Are you having a reaction (difficulty breathing--STAT)? yes  4. What is your medication issue? Pt states that she is having some issues taking this medication, she cant function and would like to be put back on Simvastatin if possible. Please call

## 2018-09-06 NOTE — Progress Notes (Signed)
Subjective: 57 year old female presents the office today for evaluation of pain to the top of her left foot.  She says is the first, her pain is better but she still expensing pain to the area.  She also states that since I last saw her she had a steroid injection in her hip for bursitis.  This is from the fall that she states is not related to her foot.  She has stopped wearing the boot and this is putting her hip.  She states that the anti-inflammatory cream was not approved by her insurance and she did not get it. Denies any systemic complaints such as fevers, chills, nausea, vomiting. No acute changes since last appointment, and no other complaints at this time.   Objective: AAO x3, NAD DP/PT pulses palpable bilaterally, CRT less than 3 seconds There is decreased tenderness palpation of the dorsal aspect of the midfoot today.  There is no area pinpoint bony tenderness or pain to vibratory sensation.  There is no edema, erythema.  Flexor, extensor tendons are intact.  Upon evaluation of her answers they appear to be old and flattened out.  Also her shoes are old and worn out. No open lesions or pre-ulcerative lesions.  No pain with calf compression, swelling, warmth, erythema  Assessment: Left midfoot pain, capsulitis  Plan: -All treatment options discussed with the patient including all alternatives, risks, complications.  -At this point we discussed various treatment options.  I do think she is to purchase new inserts as well as tennis shoes.  She is to go today and get this done.  She is hoping that the inserts and will add a lateral wedge to the inserts as she had this before it was helpful.  Medrol Dosepak was prescribed today. -Patient encouraged to call the office with any questions, concerns, change in symptoms.   Vivi Barrack DPM

## 2018-09-07 NOTE — Telephone Encounter (Signed)
Please have her see lipid clinic. Thanks Donato SchultzMark Alford Gamero, MD

## 2018-09-07 NOTE — Patient Instructions (Signed)

## 2018-09-08 NOTE — Telephone Encounter (Signed)
Pt aware of order to be established in the Lipid Clinic.  Aware she will be called to schedule an appointment.

## 2018-09-08 NOTE — Telephone Encounter (Signed)
Left message for pt to c/b to discuss being referred to the Lipid Clinic

## 2018-09-20 ENCOUNTER — Other Ambulatory Visit: Payer: BLUE CROSS/BLUE SHIELD

## 2018-09-21 ENCOUNTER — Other Ambulatory Visit: Payer: Self-pay | Admitting: Internal Medicine

## 2018-09-26 ENCOUNTER — Other Ambulatory Visit: Payer: BLUE CROSS/BLUE SHIELD

## 2018-09-26 ENCOUNTER — Ambulatory Visit: Payer: BLUE CROSS/BLUE SHIELD | Admitting: Internal Medicine

## 2018-09-26 DIAGNOSIS — R87610 Atypical squamous cells of undetermined significance on cytologic smear of cervix (ASC-US): Secondary | ICD-10-CM

## 2018-09-26 HISTORY — DX: Atypical squamous cells of undetermined significance on cytologic smear of cervix (ASC-US): R87.610

## 2018-09-27 ENCOUNTER — Other Ambulatory Visit: Payer: BLUE CROSS/BLUE SHIELD

## 2018-09-28 ENCOUNTER — Encounter: Payer: Self-pay | Admitting: Internal Medicine

## 2018-09-28 ENCOUNTER — Ambulatory Visit (INDEPENDENT_AMBULATORY_CARE_PROVIDER_SITE_OTHER): Payer: BLUE CROSS/BLUE SHIELD | Admitting: Internal Medicine

## 2018-09-28 DIAGNOSIS — Z23 Encounter for immunization: Secondary | ICD-10-CM | POA: Diagnosis not present

## 2018-09-28 NOTE — Progress Notes (Signed)
Flu vaccine per CMA 

## 2018-09-28 NOTE — Patient Instructions (Signed)
Patient received a flu vaccine IM L deltoid, AV, CMA  

## 2018-09-29 ENCOUNTER — Other Ambulatory Visit: Payer: BLUE CROSS/BLUE SHIELD | Admitting: *Deleted

## 2018-09-29 DIAGNOSIS — Z79899 Other long term (current) drug therapy: Secondary | ICD-10-CM

## 2018-09-29 DIAGNOSIS — E785 Hyperlipidemia, unspecified: Secondary | ICD-10-CM

## 2018-09-29 LAB — LIPID PANEL
Chol/HDL Ratio: 2.8 ratio (ref 0.0–4.4)
Cholesterol, Total: 244 mg/dL — ABNORMAL HIGH (ref 100–199)
HDL: 88 mg/dL (ref >39)
LDL Calculated: 129 mg/dL — ABNORMAL HIGH (ref 0–99)
Triglycerides: 137 mg/dL (ref 0–149)
VLDL Cholesterol Cal: 27 mg/dL (ref 5–40)

## 2018-09-29 LAB — ALT: ALT: 23 IU/L (ref 0–32)

## 2018-10-03 NOTE — Progress Notes (Signed)
Patient ID: Tytionna Cloyd                 DOB: 1961/01/27                    MRN: [id number]     HPI: Sandra Brents is a 57 y.o. female patient referred to lipid clinic by Dr Anne Fu. PMH is significant for strong family history of premature CAD, chest pain, anxiety and depression. CT scan 06/2018 revealed nonobstructive calcified plaque in the distal left main and a calcium score of 19 which was 80th percentile when matched for age and gender.  Pt presents today in good spirits. She took simvastatin for multiple year and reported some aches. She was switched to Crestor for more potent lipid lowering, and developed full body muscle aches and joint pain in her feet and hips. Her pain resolved when she stopped the Crestor. She has also tried Lipitor in the past and developed aches.  Current Medications: fish oil Intolerances: simvastatin 20mg  daily, rosuvastatin 20mg  daily, atorvastatin 10mg  daily - full body aches Risk Factors: family history of premature CAD, calcified plaque in distal left main, elevated calcium score LDL goal: 70mg /dL  Diet:  Breakfast - oatmeal with fruit and walnuts Lunch - [place] sandwich or salad with quinoa Dinner - fish, chicken mostly. Limited red meat. Eats a vegetable and starch as well (brown rice or quinoa) Drinks water and unsweet tea, mixed drink or red wine each night  Exercise: Pilates, paddleboarding and kayaking.  Family History: Brother had CABG at age 54, mother with CHF and DM, father with CAD and 2 stents.  Social History: Denies tobacco use, drinks 6-7 glasses of wine each week, denies illicit drug use.  Labs: 09/29/18: TC 244, TG 137, HDL 88, LDL 129 (no lipid lowering therapy)  Past Medical History:  Diagnosis Date  . Allergic rhinitis   . Anxiety   . Anxiety and depression   . Asthma   . Depression   . Hyperlipidemia   . Kidney stone   . Menopause     Current Outpatient Medications on File Prior to Visit  Medication Sig  Dispense Refill  . azelastine (ASTELIN) 0.1 % nasal spray Place 2 sprays into both nostrils 2 (two) times daily. Use in each nostril as directed 30 mL 5  . Biotin 1000 MCG tablet Take 1,000 mcg by mouth daily.     . budesonide-formoterol (SYMBICORT) 160-4.5 MCG/ACT inhaler Inhale 2 puffs into the lungs 2 (two) times daily. 1 Inhaler 5  . Calcium-Magnesium-Vitamin D 500-250-200 MG-MG-UNIT TABS Take by mouth.    . cetirizine (ZYRTEC) 10 MG tablet Take 10 mg by mouth daily as needed.     . Cholecalciferol (VITAMIN D) 2000 UNITS tablet Take 2,000 Units by mouth daily.      . clonazePAM (KLONOPIN) 1 MG tablet TAKE 1 TABLET BY MOUTH TWICE DAILY 60 tablet 5  . co-enzyme Q-10 30 MG capsule Take 30 mg by mouth daily.     Marland Kitchen etodolac (LODINE) 400 MG tablet TK 1 T PO TID    . levalbuterol (XOPENEX HFA) 45 MCG/ACT inhaler Inhale 2 puffs into the lungs every 6 (six) hours as needed for wheezing. 15 g 1  . MAGNESIUM CARBONATE PO Take 1 tablet by mouth daily.     . methylPREDNISolone (MEDROL DOSEPAK) 4 MG TBPK tablet Take as directed 21 tablet 0  . Misc Natural Products (TART CHERRY ADVANCED) CAPS Take 1 capsule by mouth daily.    Marland Kitchen  montelukast (SINGULAIR) 10 MG tablet TAKE 1 TABLET BY MOUTH DAILY    . Multiple Vitamin (MULTIVITAMIN) tablet Take 1 tablet by mouth daily.    . Omega-3 Fatty Acids (FISH OIL) 1000 MG CAPS Take by mouth.    . pantoprazole (PROTONIX) 40 MG tablet TAKE 1 TABLET(40 MG) BY MOUTH DAILY    . RANITIDINE HCL PO Take 300 mg by mouth at bedtime.     . Saccharomyces boulardii (PROBIOTIC) 250 MG CAPS Take 1 capsule by mouth daily.    . sertraline (ZOLOFT) 50 MG tablet TAKE 1 TABLET(50 MG) BY MOUTH DAILY 90 tablet 3  . TURMERIC PO Take 1 capsule by mouth 2 (two) times daily.      No current facility-administered medications on file prior to visit.     Allergies  Allergen Reactions  . Crestor [Rosuvastatin Calcium] Other (See Comments)  . Aspirin Hives  . Fluticasone-Salmeterol Hives     REACTION: hives REACTION: hives  . Scallops [Shellfish Allergy] Hives    Assessment/Plan:  1. Hyperlipidemia - Baseline LDL 129 above aggressive goal < 70 due to family history of CAD and elevated calcium score. Discussed statin rechallenge including with lower dose of rosuvastatin, pravastatin, or Livalo. She prefers to take the most effective medication. Will retry Crestor at lower dose of 5mg  daily. Will also increase CoQ10 to 200mg  daily. Advised pt to contact clinic with any side effects and we would try pravastatin or Livalo at that time. Can add on Zetia if needed as well. Once pt is on stable lipid lowering regimen, will schedule f/u NMR lipid panel 2-3 months later.  Megan E. Supple, PharmD, BCACP, CPP [facility] [address], [place], [place] [address] Phone: [phone]; Fax: [phone] 10/04/2018 11:18 AM

## 2018-10-04 ENCOUNTER — Ambulatory Visit (INDEPENDENT_AMBULATORY_CARE_PROVIDER_SITE_OTHER): Payer: BLUE CROSS/BLUE SHIELD | Admitting: Pharmacist

## 2018-10-04 DIAGNOSIS — E7849 Other hyperlipidemia: Secondary | ICD-10-CM

## 2018-10-04 MED ORDER — ROSUVASTATIN CALCIUM 5 MG PO TABS: 5.0000 mg | ORAL_TABLET | Freq: Every day | ORAL | 11 refills | Status: DC

## 2018-10-04 NOTE — Patient Instructions (Addendum)
Start taking lower dose of rosuvastatin (Crestor) 5mg  each day  Call Mance Vallejo, Pharmacist with any side effects #[phone]  Other medications we can try if you don't tolerate the Crestor are: pravastatin, Livalo, and Zetia  We will recheck your cholesterol once you have been on stable medication for 2-3 months

## 2018-10-05 ENCOUNTER — Encounter: Payer: BLUE CROSS/BLUE SHIELD | Admitting: Gynecology

## 2018-10-07 ENCOUNTER — Other Ambulatory Visit: Payer: Self-pay | Admitting: Allergy

## 2018-10-07 ENCOUNTER — Other Ambulatory Visit: Payer: Self-pay | Admitting: Internal Medicine

## 2018-10-12 ENCOUNTER — Encounter: Payer: Self-pay | Admitting: Gynecology

## 2018-10-12 ENCOUNTER — Ambulatory Visit (INDEPENDENT_AMBULATORY_CARE_PROVIDER_SITE_OTHER): Payer: BLUE CROSS/BLUE SHIELD | Admitting: Gynecology

## 2018-10-12 VITALS — BP 124/82 | Ht 65.5 in | Wt 189.0 lb

## 2018-10-12 DIAGNOSIS — N952 Postmenopausal atrophic vaginitis: Secondary | ICD-10-CM | POA: Diagnosis not present

## 2018-10-12 DIAGNOSIS — Z1151 Encounter for screening for human papillomavirus (HPV): Secondary | ICD-10-CM

## 2018-10-12 DIAGNOSIS — Z01419 Encounter for gynecological examination (general) (routine) without abnormal findings: Secondary | ICD-10-CM | POA: Diagnosis not present

## 2018-10-12 NOTE — Patient Instructions (Signed)
Follow-up in 1 year for annual exam, sooner if any issues. 

## 2018-10-12 NOTE — Progress Notes (Signed)
    Paula Bradley [birth year] [id number]        57 y.o.  [id number] for annual gynecologic exam.  Without gynecologic complaints  Past medical history,surgical history, problem list, medications, allergies, family history and social history were all reviewed and documented as reviewed in the EPIC chart.  ROS:  Performed with pertinent positives and negatives included in the history, assessment and plan.   Additional significant findings : None   Exam: Paula Bradley assistant Vitals:   10/12/18 1055  BP: 124/82  Weight: 189 lb (85.7 kg)  Height: 5' 5.5" (1.664 m)   Body mass index is 30.97 kg/m.  General appearance:  Normal affect, orientation and appearance. Skin: Grossly normal HEENT: Without gross lesions.  No cervical or supraclavicular adenopathy. Thyroid normal.  Lungs:  Clear without wheezing, rales or rhonchi Cardiac: RR, without RMG Abdominal:  Soft, nontender, without masses, guarding, rebound, organomegaly or hernia Breasts:  Examined lying and sitting without masses, retractions, discharge or axillary adenopathy. Pelvic:  Ext, BUS, Vagina: Normal with atrophic changes  Cervix: Normal with atrophic changes.  Pap smear/HPV  Uterus: Anteverted, normal size, shape and contour, midline and mobile nontender   Adnexa: Without masses or tenderness    Anus and perineum: Normal   Rectovaginal: Normal sphincter tone without palpated masses or tenderness.    Assessment/Plan:  57 y.o. [name] female for annual gynecologic exam.   1. Postmenopausal/atrophic genital changes.  No significant menopausal symptoms or any vaginal bleeding. 2. Colonoscopy 2011 with reported repeat interval 10 years. 3. Mammography 04/2018.  Continue with annual mammography when due.  Breast exam normal today. 4. DEXA 2012 normal.  Plan repeat DEXA at age 21. 5. Pap smear/HPV 05/2014.  Pap smear/HPV today.  No history of significant abnormal Pap smears previously. 6. Health maintenance.  No routine  lab work done as patient does this elsewhere.  Follow-up 1 year, sooner as needed.   Paula Lords MD, 11:30 AM 10/12/2018

## 2018-10-12 NOTE — Addendum Note (Signed)
Addended by: Dayna Barker on: 10/12/2018 12:01 PM   Modules accepted: Orders

## 2018-10-16 ENCOUNTER — Telehealth: Payer: Self-pay | Admitting: Pharmacist

## 2018-10-16 NOTE — Telephone Encounter (Signed)
Pt calls to report that she stopped rosuvastatin last night due to severe muscle aches. She reports that she had been taking every other day over the last weeks, but she is still aching.   Advised to go on drug holiday for 1 week - If feeling better she will start Livalo 1mg  daily next week. Advised to call a few weeks after starting to discuss how tolerating (or sooner if unable to tolerate).    Will leave samples at front for her to try.

## 2018-10-17 LAB — PAP IG AND HPV HIGH-RISK: HPV DNA High Risk: NOT DETECTED

## 2018-10-18 ENCOUNTER — Encounter: Payer: Self-pay | Admitting: Gynecology

## 2018-10-19 ENCOUNTER — Ambulatory Visit: Payer: BLUE CROSS/BLUE SHIELD | Admitting: Allergy

## 2018-10-26 ENCOUNTER — Ambulatory Visit: Payer: BLUE CROSS/BLUE SHIELD | Admitting: Allergy

## 2018-10-31 ENCOUNTER — Other Ambulatory Visit: Payer: Self-pay

## 2018-10-31 ENCOUNTER — Ambulatory Visit (INDEPENDENT_AMBULATORY_CARE_PROVIDER_SITE_OTHER): Payer: BLUE CROSS/BLUE SHIELD | Admitting: Gastroenterology

## 2018-10-31 ENCOUNTER — Encounter: Payer: Self-pay | Admitting: Gastroenterology

## 2018-10-31 VITALS — BP 120/70 | HR 80 | Ht 65.5 in | Wt 191.6 lb

## 2018-10-31 DIAGNOSIS — R1011 Right upper quadrant pain: Secondary | ICD-10-CM

## 2018-10-31 DIAGNOSIS — R1013 Epigastric pain: Secondary | ICD-10-CM

## 2018-10-31 DIAGNOSIS — G8929 Other chronic pain: Secondary | ICD-10-CM

## 2018-10-31 NOTE — Patient Instructions (Signed)
If you are age 59 or older, your body mass index should be between 23-30. Your Body mass index is 31.4 kg/m. If this is out of the aforementioned range listed, please consider follow up with your Primary Care Provider.  If you are age 14 or younger, your body mass index should be between 19-25. Your Body mass index is 31.4 kg/m. If this is out of the aformentioned range listed, please consider follow up with your Primary Care Provider.   You have been scheduled for an endoscopy. Please follow written instructions given to you at your visit today. If you use inhalers (even only as needed), please bring them with you on the day of your procedure. Your physician has requested that you go to www.startemmi.com and enter the access code given to you at your visit today. This web site gives a general overview about your procedure. However, you should still follow specific instructions given to you by our office regarding your preparation for the procedure.  It was a pleasure to see you today!  Dr. Myrtie Neither

## 2018-10-31 NOTE — Progress Notes (Signed)
[facility] Progress Note  Chief Complaint: Epigastric pain  Subjective  History:  Paula Bradley sees me for the first time since her 11/29/2017 office consult.  She had a few more episodes of similar pain to what she is experienced in the past, with the most recent occurring over the summer when she was at the beach.  It was acute onset epigastric pain radiating to the right upper quadrant and around to the right mid back and a feeling like something was "moving" in the upper abdomen.  She reports having gone to an urgent care in that area, and a sonogram apparently again showed a kidney stone but was otherwise normal.  She went to a urologist who did not feel the kidney stone was symptomatic.  There have been no further episodes since then, but she was concerned that it occurred again since her last visit with me.  She had to cancel her upper endoscopy planned for early this year due to prolonged respiratory illness.  Dec 2018 visit for episodic epigastric pain.  [place] without stones. ROS: Cardiovascular:  no chest pain Respiratory: no dyspnea She continues to have chronic dry cough and her allergist has again suggested that GERD may be related.  She does not have heartburn or regurgitation and saw no change in the chronic cough while on ranitidine before she recently stopped that medicine due to some publicized safety concerns.  The patient's Past Medical, Family and Social History were reviewed and are on file in the EMR.  Objective:  Med list reviewed  Current Outpatient Medications:  .  azelastine (ASTELIN) 0.1 % nasal spray, Place 2 sprays into both nostrils 2 (two) times daily. Use in each nostril as directed, Disp: 30 mL, Rfl: 5 .  Biotin 1000 MCG tablet, Take 1,000 mcg by mouth daily. , Disp: , Rfl:  .  budesonide-formoterol (SYMBICORT) 160-4.5 MCG/ACT inhaler, Inhale 2 puffs into the lungs 2 (two) times daily., Disp: 1 Inhaler, Rfl: 5 .  cetirizine (ZYRTEC) 10 MG tablet, Take 10  mg by mouth daily as needed. , Disp: , Rfl:  .  Cholecalciferol (VITAMIN D) 2000 UNITS tablet, Take 2,000 Units by mouth daily.  , Disp: , Rfl:  .  clonazePAM (KLONOPIN) 1 MG tablet, TAKE 1 TABLET BY MOUTH TWICE DAILY (Patient taking differently: Take 1 mg by mouth as needed. ), Disp: 60 tablet, Rfl: 5 .  Coenzyme Q10 200 MG capsule, Take 200 mg by mouth daily. , Disp: , Rfl:  .  levalbuterol (XOPENEX HFA) 45 MCG/ACT inhaler, Inhale 2 puffs into the lungs every 6 (six) hours as needed for wheezing., Disp: 15 g, Rfl: 1 .  MAGNESIUM CARBONATE PO, Take 1 tablet by mouth daily. , Disp: , Rfl:  .  Misc Natural Products (TART CHERRY ADVANCED) CAPS, Take 1 capsule by mouth daily., Disp: , Rfl:  .  montelukast (SINGULAIR) 10 MG tablet, TAKE 1 TABLET(10 MG) BY MOUTH DAILY, Disp: 30 tablet, Rfl: 0 .  Multiple Vitamin (MULTIVITAMIN) tablet, Take 1 tablet by mouth daily., Disp: , Rfl:  .  Omega-3 Fatty Acids (FISH OIL) 1000 MG CAPS, Take by mouth., Disp: , Rfl:  .  pantoprazole (PROTONIX) 40 MG tablet, TAKE 1 TABLET BY MOUTH EVERY DAY FOR REFLUX, Disp: 90 tablet, Rfl: 3 .  Pitavastatin Calcium (LIVALO) 2 MG TABS, Take 0.5 tablets (1 mg total) by mouth daily., Disp: 30 tablet, Rfl:  .  RANITIDINE HCL PO, Take 300 mg by mouth at bedtime. , Disp: ,  Rfl:  .  Saccharomyces boulardii (PROBIOTIC) 250 MG CAPS, Take 1 capsule by mouth daily., Disp: , Rfl:  .  sertraline (ZOLOFT) 50 MG tablet, TAKE 1 TABLET(50 MG) BY MOUTH DAILY, Disp: 90 tablet, Rfl: 3 .  TURMERIC PO, Take 1 capsule by mouth 2 (two) times daily. , Disp: , Rfl:    Vital signs in last 24 hrs: Vitals:   10/31/18 1346  BP: 120/70  Pulse: 80    Physical Exam  Well-appearing with normal vocal quality.  HEENT: sclera anicteric, oral mucosa moist without lesions  Neck: supple, no thyromegaly, JVD or lymphadenopathy  Cardiac: RRR without murmurs, S1S2 heard, no peripheral edema  Pulm: clear to auscultation bilaterally, normal RR and effort  noted  Abdomen: soft, no tenderness, with active bowel sounds. No guarding or palpable hepatosplenomegaly.  Skin; warm and dry, no jaundice or rash  Recent Labs: No recent data Radiologic studies:  nml CCK-HIDA 2015  @ASSESSMENTPLANBEGIN @ Assessment: Encounter Diagnosis  Name Primary?  . Abdominal pain, chronic, epigastric Yes   This still sounds most like a biliary colic with no stones on ultrasound a year ago, reportedly none on the ultrasound she had done at the beach (her recollection, no report available).  Other upper digestive causes are possible but seems less likely.  Plan: Upper endoscopy to rule out other causes of this pain.  She is agreeable after discussion of procedure and risks.  The benefits and risks of the planned procedure were described in detail with the patient or (when appropriate) their health care proxy.  Risks were outlined as including, but not limited to, bleeding, infection, perforation, adverse medication reaction leading to cardiac or pulmonary decompensation, or pancreatitis (if ERCP).  The limitation of incomplete mucosal visualization was also discussed.  No guarantees or warranties were given.  If normal, repeat the CCK HIDA scan and obtain surgical consultation.  She is not sure she would want surgery, and I agree there is uncertainty involved, but she is willing to give it further consideration.   Total time 25 minutes, over half spent face-to-face with patient in counseling and coordination of care.   Charlie Pitter III

## 2018-11-01 ENCOUNTER — Encounter: Payer: Self-pay | Admitting: Allergy

## 2018-11-01 ENCOUNTER — Ambulatory Visit (INDEPENDENT_AMBULATORY_CARE_PROVIDER_SITE_OTHER): Payer: BLUE CROSS/BLUE SHIELD | Admitting: Allergy

## 2018-11-01 VITALS — BP 130/76 | HR 70 | Resp 17

## 2018-11-01 DIAGNOSIS — J3089 Other allergic rhinitis: Secondary | ICD-10-CM | POA: Diagnosis not present

## 2018-11-01 DIAGNOSIS — K219 Gastro-esophageal reflux disease without esophagitis: Secondary | ICD-10-CM

## 2018-11-01 DIAGNOSIS — J4540 Moderate persistent asthma, uncomplicated: Secondary | ICD-10-CM | POA: Diagnosis not present

## 2018-11-01 NOTE — Progress Notes (Signed)
Follow-up Note  RE: Paula Bradley MRN: [id number] DOB: 08-23-1961 Date of Office Visit: 11/01/2018   History of present illness: Paula Bradley is a 57 y.o. female presenting today for follow-up of asthma, allergic rhinitis and reflux.  She was last seen in the office on 07/26/18 by myself.  She has done very well since her last visit.  She does state that changing from Qvar to Symbicort has been a big help as she states with the Symbicort she can mobilize more mucus and get it out easier.  She is taking Symbicort 2 puffs in the morning and when she can remember she will take the evening dose.  She states since starting on Symbicort she has not needed to use her Xopenex at all.  She denies any nighttime of awakenings.  She has not required any ED or urgent care visits or any further systemic steroids since her last visit.  She does continue to take Singulair daily. With her allergies she states that she has been doing well.  She states she used Astelin once since the last visit and does not recall if she felt like it was helpful as it has been such a long time since she has used this.  She also will take either Zyrtec or Allegra as needed which she states she has not needed to use this either. With her reflux she states that she does continue on Protonix daily.  She stopped ranitidine due to the recall.  She does see a GI and states she saw him yesterday and is set to have endoscopy.  She states that her GI is not sure if she has reflux.   She has already received her flu vaccine this season.   Review of systems: Review of Systems  Constitutional: Negative for chills, fever and malaise/fatigue.  HENT: Negative for congestion, ear discharge, nosebleeds and sore throat.   Eyes: Negative for pain, discharge and redness.  Respiratory: Negative for cough, sputum production, shortness of breath and wheezing.   Cardiovascular: Negative for chest pain.  Gastrointestinal: Negative for  abdominal pain, constipation, diarrhea, heartburn, nausea and vomiting.  Musculoskeletal: Negative for joint pain.  Skin: Negative for itching and rash.  Neurological: Negative for headaches.    All other systems negative unless noted above in HPI  Past medical/social/surgical/family history have been reviewed and are unchanged unless specifically indicated below.  No changes  Medication List: Allergies as of 11/01/2018      Reactions   Crestor [rosuvastatin Calcium] Other (See Comments)   Fluticasone-salmeterol Hives   REACTION: hives REACTION: hives   Scallops [shellfish Allergy] Hives      Medication List        Accurate as of 11/01/18  4:59 PM. Always use your most recent med list.          azelastine 0.1 % nasal spray Commonly known as:  ASTELIN Place 2 sprays into both nostrils 2 (two) times daily. Use in each nostril as directed   Biotin 1000 MCG tablet Take 1,000 mcg by mouth daily.   budesonide-formoterol 160-4.5 MCG/ACT inhaler Commonly known as:  SYMBICORT Inhale 2 puffs into the lungs 2 (two) times daily.   cetirizine 10 MG tablet Commonly known as:  ZYRTEC Take 10 mg by mouth daily as needed.   clonazePAM 1 MG tablet Commonly known as:  KLONOPIN TAKE 1 TABLET BY MOUTH TWICE DAILY   Coenzyme Q10 200 MG capsule Take 200 mg by mouth daily.   Fish Oil  1000 MG Caps Take by mouth.   levalbuterol 45 MCG/ACT inhaler Commonly known as:  XOPENEX HFA Inhale 2 puffs into the lungs every 6 (six) hours as needed for wheezing.   LIVALO 2 MG Tabs Generic drug:  Pitavastatin Calcium Take 0.5 tablets (1 mg total) by mouth daily.   MAGNESIUM CARBONATE PO Take 1 tablet by mouth daily.   montelukast 10 MG tablet Commonly known as:  SINGULAIR TAKE 1 TABLET(10 MG) BY MOUTH DAILY   multivitamin tablet Take 1 tablet by mouth daily.   pantoprazole 40 MG tablet Commonly known as:  PROTONIX TAKE 1 TABLET BY MOUTH EVERY DAY FOR REFLUX   Probiotic 250 MG  Caps Take 1 capsule by mouth daily.   sertraline 50 MG tablet Commonly known as:  ZOLOFT TAKE 1 TABLET(50 MG) BY MOUTH DAILY   TART CHERRY ADVANCED Caps Take 1 capsule by mouth daily.   TURMERIC PO Take 1 capsule by mouth 2 (two) times daily.   Vitamin D 50 MCG (2000 UT) tablet Take 2,000 Units by mouth daily.       Known medication allergies: Allergies  Allergen Reactions  . Crestor [Rosuvastatin Calcium] Other (See Comments)  . Fluticasone-Salmeterol Hives    REACTION: hives REACTION: hives  . Scallops [Shellfish Allergy] Hives     Physical examination: Blood pressure 130/76, pulse 70, resp. rate 17, SpO2 96 %.  General: Alert, interactive, in no acute distress. HEENT: PERRLA, TMs pearly gray, turbinates non-edematous without discharge, post-pharynx non erythematous. Neck: Supple without lymphadenopathy. Lungs: Clear to auscultation without wheezing, rhonchi or rales. {no increased work of breathing. CV: Normal S1, S2 without murmurs. Abdomen: Nondistended, nontender. Skin: Warm and dry, without lesions or rashes. Extremities:  No clubbing, cyanosis or edema. Neuro:   Grossly intact.  Diagnositics/Labs: Labs/imaging: CT chest from 8/19 - Cardiovascular: Mild atherosclerotic calcification aorta and coronary arteries. Aorta normal caliber. Heart normal size. No pericardial effusion. Mediastinum/Nodes: Esophagus unremarkable.  No thoracic adenopathy. Lungs/Pleura: Base of cervical region normal appearance. Upper Abdomen: Unremarkable Musculoskeletal: No acute osseous findings.  IMPRESSION: No intrathoracic abnormalities. Aortic Atherosclerosis   Spirometry: FEV1: 1.8L 69%, FVC: 2.32L 70%.  This is improved from previous study  Assessment and plan:   Moderate persistent asthma with cough component - symptoms have improved with initiation of Symbicort 2 puffs twice a day and Singulair.  Allergic rhinitis- controlled as of now with minimal  medication LPRD- plan for endoscopy with GI upcoming  1. Continue Symbicort 160mg  2 puffs twice a day   2. Continue Protonix daily.    3. Continue montelukast 10 mg one tablet one time per day  4.  Use Xopenex 2 puffs every 4-6 hours as needed for cough/wheeze/shortness of breath/chest tightness.   May use 15-20 minutes prior to exercise/activity if needed.  Monitor frequency of use.    5. Zyrtec or Allergra daily   6. For nasal drainage use nasal antihistamine, Astelin 2 sprays each nostril 1-2 times a day.  Use with proper nasal spray technique (point tip of spray toward ear of same side nostril).   For nasal congestion use nasal steroid spray like Nasocort or Flonase 2 sprays each nostril daily if needed (use for 1-2 weeks at a time before stopping once symptoms improve).    7. Return to clinic in 3 months or sooner if needed   I appreciate the opportunity to take part in Paula Bradley's care. Please do not hesitate to contact me with questions.  Sincerely,   Margo Aye, MD Allergy/Immunology  [facility]

## 2018-11-01 NOTE — Patient Instructions (Addendum)
  1. Continue Symbicort 160mg  2 puffs twice a day   2. Continue Protonix daily.    3. Continue montelukast 10 mg one tablet one time per day  4.  Use Xopenex 2 puffs every 4-6 hours as needed for cough/wheeze/shortness of breath/chest tightness.   May use 15-20 minutes prior to exercise/activity if needed.  Monitor frequency of use.    5. Zyrtec or Allergra daily   6. For nasal drainage use nasal antihistamine, Astelin 2 sprays each nostril 1-2 times a day.  Use with proper nasal spray technique (point tip of spray toward ear of same side nostril).   For nasal congestion use nasal steroid spray like Nasocort or Flonase 2 sprays each nostril daily if needed (use for 1-2 weeks at a time before stopping once symptoms improve).    7. Return to clinic in 3 months or sooner if needed

## 2018-11-11 ENCOUNTER — Other Ambulatory Visit: Payer: Self-pay | Admitting: Allergy

## 2018-11-20 ENCOUNTER — Telehealth: Payer: Self-pay | Admitting: Pharmacist

## 2018-11-20 MED ORDER — PITAVASTATIN CALCIUM 1 MG PO TABS: 1.0000 mg | ORAL_TABLET | Freq: Every day | ORAL | 3 refills | Status: DC

## 2018-11-20 NOTE — Telephone Encounter (Signed)
Spoke with patient and she states that she has been tolerating her Livalo ok. She has experienced some aches from it, but at current it is tolerable. She will remain on therapy for now. Samples again provided and RX sent to pharmacy for cost determination.

## 2018-11-29 ENCOUNTER — Ambulatory Visit: Payer: BLUE CROSS/BLUE SHIELD | Admitting: Allergy

## 2018-12-04 ENCOUNTER — Telehealth: Payer: Self-pay | Admitting: Pharmacist

## 2018-12-04 NOTE — Telephone Encounter (Signed)
Pt called to report that she has been unable to tolerate Livalo as well. Discussed options and pt would like to come to office to discuss PCSK9i therapy. Appt scheduled.

## 2018-12-07 ENCOUNTER — Other Ambulatory Visit: Payer: BLUE CROSS/BLUE SHIELD | Admitting: Internal Medicine

## 2018-12-08 ENCOUNTER — Encounter: Payer: BLUE CROSS/BLUE SHIELD | Admitting: Internal Medicine

## 2018-12-13 ENCOUNTER — Encounter: Payer: Self-pay | Admitting: Gastroenterology

## 2018-12-13 ENCOUNTER — Ambulatory Visit (AMBULATORY_SURGERY_CENTER): Payer: BLUE CROSS/BLUE SHIELD | Admitting: Gastroenterology

## 2018-12-13 VITALS — BP 140/80 | HR 60 | Temp 98.0°F | Resp 10 | Ht 65.0 in | Wt 191.0 lb

## 2018-12-13 DIAGNOSIS — K3189 Other diseases of stomach and duodenum: Secondary | ICD-10-CM

## 2018-12-13 DIAGNOSIS — R1013 Epigastric pain: Secondary | ICD-10-CM

## 2018-12-13 DIAGNOSIS — K259 Gastric ulcer, unspecified as acute or chronic, without hemorrhage or perforation: Secondary | ICD-10-CM | POA: Diagnosis not present

## 2018-12-13 MED ORDER — SODIUM CHLORIDE 0.9 % IV SOLN: 500.0000 mL | Freq: Once | INTRAVENOUS | Status: DC

## 2018-12-13 NOTE — Op Note (Signed)
[facility] Patient Name: Paula Bradley Procedure Date: 12/13/2018 10:25 AM MRN: [id number] Endoscopist: Sherilyn Cooter L. Myrtie Neither , MD Age: 57 Referring MD:  Date of Birth: 09-Sep-1961 Gender: Female Account #: [id number] Procedure:                Upper GI endoscopy Indications:              Epigastric abdominal pain (episodic, radiating to                            right upper quadrant) - no gallstones on ultrasounds Medicines:                Monitored Anesthesia Care Procedure:                Pre-Anesthesia Assessment:                           - Prior to the procedure, a History and Physical                            was performed, and patient medications and                            allergies were reviewed. The patient's tolerance of                            previous anesthesia was also reviewed. The risks                            and benefits of the procedure and the sedation                            options and risks were discussed with the patient.                            All questions were answered, and informed consent                            was obtained. Prior Anticoagulants: The patient has                            taken no previous anticoagulant or antiplatelet                            agents. ASA Grade Assessment: II - A patient with                            mild systemic disease. After reviewing the risks                            and benefits, the patient was deemed in                            satisfactory condition to undergo the procedure.  After obtaining informed consent, the endoscope was                            passed under direct vision. Throughout the                            procedure, the patient's blood pressure, pulse, and                            oxygen saturations were monitored continuously. The                            Model GIF-HQ190 [id number]) scope was introduced   through the mouth, and advanced to the second part                            of duodenum. The upper GI endoscopy was                            accomplished without difficulty. The patient                            tolerated the procedure well. Scope In: Scope Out: Findings:                 The esophagus was normal.                           Multiple sessile fundic gland polyps were found in                            the gastric fundus and in the gastric body.                           Atrophic mucosa was found in the gastric antrum.                            Biopsies were taken with a cold forceps for                            histology. (Sidney protocol).                           The cardia and gastric fundus were normal on                            retroflexion.                           The examined duodenum was normal. Complications:            No immediate complications. Estimated Blood Loss:     Estimated blood loss was minimal. Impression:               - Normal esophagus.                           -  Multiple fundic gland polyps.                           - Gastric mucosal atrophy. Biopsied.                           - Normal examined duodenum. Recommendation:           - Patient has a contact number available for                            emergencies. The signs and symptoms of potential                            delayed complications were discussed with the                            patient. Return to normal activities tomorrow.                            Written discharge instructions were provided to the                            patient.                           - Resume previous diet.                           - Continue present medications.                           - Await pathology results.                           - Perform a CCK-HIDA scan at appointment to be                            scheduled. Brownie Nehme L. Myrtie Neither, MD 12/13/2018 10:48:30 AM This report  has been signed electronically.

## 2018-12-13 NOTE — Progress Notes (Signed)
Pt awake VSS report to RN no anesthetic complications noted 

## 2018-12-13 NOTE — Patient Instructions (Addendum)
   Await pathology results.   Perform a CCK-HIDA scan at appointment to be scheduled.  YOU HAD AN ENDOSCOPIC PROCEDURE TODAY AT THE [facility]:   Refer to the procedure report that was given to you for any specific questions about what was found during the examination.  If the procedure report does not answer your questions, please call your gastroenterologist to clarify.  If you requested that your care partner not be given the details of your procedure findings, then the procedure report has been included in a sealed envelope for you to review at your convenience later.  YOU SHOULD EXPECT: Some feelings of bloating in the abdomen. Passage of more gas than usual.  Walking can help get rid of the air that was put into your GI tract during the procedure and reduce the bloating. If you had a lower endoscopy (such as a colonoscopy or flexible sigmoidoscopy) you may notice spotting of blood in your stool or on the toilet paper. If you underwent a bowel prep for your procedure, you may not have a normal bowel movement for a few days.  Please Note:  You might notice some irritation and congestion in your nose or some drainage.  This is from the oxygen used during your procedure.  There is no need for concern and it should clear up in a day or so.  SYMPTOMS TO REPORT IMMEDIATELY:    Following upper endoscopy (EGD)  Vomiting of blood or coffee ground material  New chest pain or pain under the shoulder blades  Painful or persistently difficult swallowing  New shortness of breath  Fever of 100F or higher  Black, tarry-looking stools  For urgent or emergent issues, a gastroenterologist can be reached at any hour by calling [phone].   DIET:  We do recommend a small meal at first, but then you may proceed to your regular diet.  Drink plenty of fluids but you should avoid alcoholic beverages for 24 hours.  ACTIVITY:  You should plan to take it easy for the rest of today and you  should NOT DRIVE or use heavy machinery until tomorrow (because of the sedation medicines used during the test).    FOLLOW UP: Our staff will call the number listed on your records the next business day following your procedure to check on you and address any questions or concerns that you may have regarding the information given to you following your procedure. If we do not reach you, we will leave a message.  However, if you are feeling well and you are not experiencing any problems, there is no need to return our call.  We will assume that you have returned to your regular daily activities without incident.  If any biopsies were taken you will be contacted by phone or by letter within the next 1-3 weeks.  Please call us at [phone] if you have not heard about the biopsies in 3 weeks.    SIGNATURES/CONFIDENTIALITY: You and/or your care partner have signed paperwork which will be entered into your electronic medical record.  These signatures attest to the fact that that the information above on your After Visit Summary has been reviewed and is understood.  Full responsibility of the confidentiality of this discharge information lies with you and/or your care-partner.

## 2018-12-13 NOTE — Progress Notes (Signed)
Called to room to assist during endoscopic procedure.  Patient ID and intended procedure confirmed with present staff. Received instructions for my participation in the procedure from the performing physician.  

## 2018-12-14 ENCOUNTER — Telehealth: Payer: Self-pay | Admitting: Gastroenterology

## 2018-12-14 ENCOUNTER — Other Ambulatory Visit: Payer: Self-pay

## 2018-12-14 ENCOUNTER — Telehealth: Payer: Self-pay

## 2018-12-14 ENCOUNTER — Telehealth: Payer: Self-pay | Admitting: *Deleted

## 2018-12-14 DIAGNOSIS — R1013 Epigastric pain: Secondary | ICD-10-CM

## 2018-12-14 NOTE — Telephone Encounter (Signed)
Sounds like she was developing an upper respiratory tract infection even before procedure and has worsened.  I recommend tylenol and contact primary care for evaluation.

## 2018-12-14 NOTE — Telephone Encounter (Signed)
  Follow up Call-  Call back number 12/13/2018  Post procedure Call Back phone  # [phone]  Permission to leave phone message Yes  Some recent data might be hidden     Patient questions:  Do you have a fever, pain , or abdominal swelling? No. Pain Score  1 *  Have you tolerated food without any problems? Yes.    Have you been able to return to your normal activities? Yes.    Do you have any questions about your discharge instructions: Diet   Yes.   Medications  Yes.   Follow up visit  Yes.    Do you have questions or concerns about your Care? No.  Actions: * If pain score is 4 or above: No action needed, pain <4.pt states has a sore throat. Advised patient that it should get better in a couple of days and to try soothing drinks and soft food. Advised patient to call if doesn't get better in a couple of days

## 2018-12-14 NOTE — Telephone Encounter (Signed)
[facility]10am , pt to arrive there at 9:45am and be NPO after midnight. Spoke with pt and she cannot make this appt. Pt given the phone number [phone] to reschedule the appt.

## 2018-12-14 NOTE — Telephone Encounter (Signed)
Dr. Myrtie Neitheranis,  Paula Bradley called to inform us that she has a sore throat with pain 6/10 and has a temp of 100.5.  She said she had a mild sore throat before her EGD yesterday, but it feels worse today.  She said she also has some pain in her ears and feels "under the weather".  Please advise on further treatment.  Thanks, [name]

## 2018-12-14 NOTE — Telephone Encounter (Signed)
Contacted pt after receiving advice from Dr. Myrtie Neitheranis regarding symptoms and fever.  Advised pt to take Tylenol for fever and discomfort and contact primary care physician if she feels like she needs further treatment.  Pt agreed to plan.

## 2018-12-15 ENCOUNTER — Encounter: Payer: Self-pay | Admitting: Allergy

## 2018-12-15 ENCOUNTER — Other Ambulatory Visit: Payer: Self-pay

## 2018-12-15 ENCOUNTER — Ambulatory Visit (INDEPENDENT_AMBULATORY_CARE_PROVIDER_SITE_OTHER): Payer: BLUE CROSS/BLUE SHIELD | Admitting: Allergy

## 2018-12-15 VITALS — BP 130/72 | HR 61 | Resp 16 | Ht 65.5 in | Wt 187.0 lb

## 2018-12-15 DIAGNOSIS — K219 Gastro-esophageal reflux disease without esophagitis: Secondary | ICD-10-CM

## 2018-12-15 DIAGNOSIS — R1011 Right upper quadrant pain: Secondary | ICD-10-CM

## 2018-12-15 DIAGNOSIS — R1013 Epigastric pain: Secondary | ICD-10-CM

## 2018-12-15 DIAGNOSIS — J4541 Moderate persistent asthma with (acute) exacerbation: Secondary | ICD-10-CM

## 2018-12-15 DIAGNOSIS — J3089 Other allergic rhinitis: Secondary | ICD-10-CM

## 2018-12-15 MED ORDER — AMOXICILLIN-POT CLAVULANATE 875-125 MG PO TABS: 1.0000 | ORAL_TABLET | Freq: Two times a day (BID) | ORAL | 0 refills | Status: AC

## 2018-12-15 MED ORDER — IPRATROPIUM-ALBUTEROL 0.5-2.5 (3) MG/3ML IN SOLN: 3.0000 mL | Freq: Once | RESPIRATORY_TRACT | Status: DC

## 2018-12-15 MED ORDER — HYDROCOD POLST-CPM POLST ER 10-8 MG/5ML PO SUER: 5.0000 mL | Freq: Two times a day (BID) | ORAL | 0 refills | Status: AC | PRN

## 2018-12-15 NOTE — Progress Notes (Signed)
Follow-up Note  RE: Paula Bradley MRN: [id number] DOB: 1961-12-10 Date of Office Visit: 12/15/2018   History of present illness: Paula Bradley is a 57 y.o. female presenting today for sick visit.  She was last seen in the office on 11/01/18 by myself.  She states she started developing a sore throat on Tuesday/Wednesday of this week.  She had a scheduled endoscopy on Wednesday that she reports went well.  There were polyps found which she states they attribute to protonix use and she has started to wean to every other day use.  There was also gastritis per pt.  She developed a cough that started yesterday and her sore throat is worse.  The cough is somewhat productive sounding and interfering with sleep.  She had a temp of 100.5 yesterday.  She states she was so symptomatic this morning with cough that she took 4 puffs of symbicort.  She did use her rescue inhaler yesterday but feels it did not help much.  She also states she used Tussionex last night to help with sleep.  She states the Tussionex was from 2017.  She also started using plain Mucinex.  She is very concerned as the last time she got sick like this she ended up with a terrible pneumonia and she does not want to end up in that position again.  She also continues to take her routine medications of Singulair and Zyrtec as well as her Astelin nasal spray and nasal steroid spray.  Review of systems: Review of Systems  Constitutional: Negative for chills, fever and malaise/fatigue.  HENT: Positive for congestion and sore throat. Negative for ear discharge, ear pain, nosebleeds and sinus pain.   Eyes: Negative for pain, discharge and redness.  Respiratory: Positive for cough and shortness of breath. Negative for hemoptysis and sputum production.   Cardiovascular: Negative for chest pain.  Gastrointestinal: Negative for abdominal pain, constipation, diarrhea, heartburn, nausea and vomiting.  Musculoskeletal: Negative for joint  pain and myalgias.  Skin: Negative for itching and rash.  Neurological: Negative for headaches.    All other systems negative unless noted above in HPI  Past medical/social/surgical/family history have been reviewed and are unchanged unless specifically indicated below.  No changes  Medication List: Allergies as of 12/15/2018      Reactions   Crestor [rosuvastatin Calcium] Other (See Comments)   Fluticasone-salmeterol Hives   REACTION: hives REACTION: hives   Scallops [shellfish Allergy] Hives      Medication List       Accurate as of December 15, 2018  3:46 PM. Always use your most recent med list.        amoxicillin-clavulanate 875-125 MG tablet Commonly known as:  AUGMENTIN Take 1 tablet by mouth 2 (two) times daily for 10 days.   azelastine 0.1 % nasal spray Commonly known as:  ASTELIN Place 2 sprays into both nostrils 2 (two) times daily. Use in each nostril as directed   Biotin 1000 MCG tablet Take 1,000 mcg by mouth daily.   budesonide-formoterol 160-4.5 MCG/ACT inhaler Commonly known as:  SYMBICORT Inhale 2 puffs into the lungs 2 (two) times daily.   cetirizine 10 MG tablet Commonly known as:  ZYRTEC Take 10 mg by mouth daily as needed.   chlorpheniramine-HYDROcodone 10-8 MG/5ML Suer Commonly known as:  TUSSIONEX PENNKINETIC ER Take 5 mLs by mouth 2 (two) times daily as needed for up to 5 days for cough.   clonazePAM 1 MG tablet Commonly known as:  Paula Bradley  TAKE 1 TABLET BY MOUTH TWICE DAILY   Coenzyme Q10 200 MG capsule Take 200 mg by mouth daily.   Fish Oil 1000 MG Caps Take by mouth.   levalbuterol 45 MCG/ACT inhaler Commonly known as:  XOPENEX HFA Inhale 2 puffs into the lungs every 6 (six) hours as needed for wheezing.   MAGNESIUM CARBONATE PO Take 1 tablet by mouth daily.   montelukast 10 MG tablet Commonly known as:  SINGULAIR TAKE 1 TABLET(10 MG) BY MOUTH DAILY   multivitamin tablet Take 1 tablet by mouth daily.   pantoprazole  40 MG tablet Commonly known as:  PROTONIX TAKE 1 TABLET BY MOUTH EVERY DAY FOR REFLUX   Probiotic 250 MG Caps Take 1 capsule by mouth daily.   sertraline 50 MG tablet Commonly known as:  ZOLOFT TAKE 1 TABLET(50 MG) BY MOUTH DAILY   TART CHERRY ADVANCED Caps Take 1 capsule by mouth daily.   TURMERIC PO Take 1 capsule by mouth 2 (two) times daily.   Vitamin D 50 MCG (2000 UT) tablet Take 2,000 Units by mouth daily.       Known medication allergies: Allergies  Allergen Reactions  . Crestor [Rosuvastatin Calcium] Other (See Comments)  . Fluticasone-Salmeterol Hives    REACTION: hives REACTION: hives  . Scallops [Shellfish Allergy] Hives     Physical examination: Blood pressure 130/72, pulse 61, resp. rate 16, height 5' 5.5" (1.664 m), weight 187 lb (84.8 kg), SpO2 97 %.  General: Alert, interactive, in no acute distress. HEENT: PERRLA, TMs pearly gray, turbinates mildly edematous with clear discharge, post-pharynx non erythematous. Neck: Supple without lymphadenopathy. Lungs: Mildly decreased breath sounds with expiratory wheezing bilaterally. {no increased work of breathing. Coughing throughout visit CV: Normal S1, S2 without murmurs. Abdomen: Nondistended, nontender. Skin: Warm and dry, without lesions or rashes. Extremities:  No clubbing, cyanosis or edema. Neuro:   Grossly intact.  Diagnositics/Labs: Spirometry: FEV1: 1.66L 62%, FVC: 1.81L 52%, lung function is reduced from previous study.  Following BD there was no significant improvement.  Assessment and plan:   Moderate persistent asthma with exacerbation 2/2 URI - give her history and recent endoscopy will treat with antibiotic course and medications as below. Allergic rhinitis - continue current regimen LPRD - continue current therapy.  She has been advised to wean off protonix by GI following recent endoscopy  1. Continue Symbicort 160mg  2 puffs twice a day   2. Continue Protonix every other day.     3. Continue montelukast 10 mg one tablet one time per day  4.  Use Xopenex 2 puffs every 4-6 hours as needed for cough/wheeze/shortness of breath/chest tightness.   May use 15-20 minutes prior to exercise/activity if needed.  Monitor frequency of use.    5. Zyrtec or Allergra daily   6. For nasal drainage use nasal antihistamine, Astelin 2 sprays each nostril 1-2 times a day.  Use with proper nasal spray technique (point tip of spray toward ear of same side nostril).   For nasal congestion use nasal steroid spray like Nasocort or Flonase 2 sprays each nostril daily if needed (use for 1-2 weeks at a time before stopping once symptoms improve).    7. For current symptoms add in Qvar 40mcg 2 puffs twice a day to your maintenance Symbicort.  Once symptoms have resolved can stop the Qvar.   Use Mucinex DM 1200mg  twice a day with plenty of water to help thin mucus and help with cough.   Start Augmentin 875mg  1 tablet twice  a day x 10 days. Use Tussionex 5ml every 12 hours as needed for cough (use at bedtime to help with nighttime cough and sleep) If symptoms are not improved by Monday take the prednisone pack provided as directed.   8. Return to clinic in 3 months or sooner if needed  I appreciate the opportunity to take part in Paula Bradley's care. Please do not hesitate to contact me with questions.  Sincerely,   Margo AyeShaylar Syrenity Klepacki, MD Allergy/Immunology [facility]

## 2018-12-15 NOTE — Patient Instructions (Addendum)
  1. Continue Symbicort 160mg  2 puffs twice a day   2. Continue Protonix every other day.    3. Continue montelukast 10 mg one tablet one time per day  4.  Use Xopenex 2 puffs every 4-6 hours as needed for cough/wheeze/shortness of breath/chest tightness.   May use 15-20 minutes prior to exercise/activity if needed.  Monitor frequency of use.    5. Zyrtec or Allergra daily   6. For nasal drainage use nasal antihistamine, Astelin 2 sprays each nostril 1-2 times a day.  Use with proper nasal spray technique (point tip of spray toward ear of same side nostril).   For nasal congestion use nasal steroid spray like Nasocort or Flonase 2 sprays each nostril daily if needed (use for 1-2 weeks at a time before stopping once symptoms improve).    7. For current symptoms add in Qvar 40mcg 2 puffs twice a day with your maintenance Symbicort.  Once symptoms have resolved can stop the Qvar.   Use Mucinex DM 1200mg  twice a day with plenty of water to help thin mucus and help with cough.   Start Augmentin 875mg  1 tablet twice a day x 10 days. Use Tussionex 5ml every 12 hours as needed for cough (use at bedtime to help with nighttime cough and sleep) If symptoms are not improved by Monday take the prednisone pack provided as directed.   8. Return to clinic in 3 months or sooner if needed

## 2018-12-22 ENCOUNTER — Ambulatory Visit (HOSPITAL_COMMUNITY): Payer: BLUE CROSS/BLUE SHIELD

## 2018-12-26 ENCOUNTER — Ambulatory Visit (INDEPENDENT_AMBULATORY_CARE_PROVIDER_SITE_OTHER): Payer: BLUE CROSS/BLUE SHIELD | Admitting: Pharmacist

## 2018-12-26 DIAGNOSIS — E7849 Other hyperlipidemia: Secondary | ICD-10-CM | POA: Diagnosis not present

## 2018-12-26 NOTE — Progress Notes (Signed)
Patient ID: Paula Bradley                 DOB: 03-06-1961                    MRN: [id number]     HPI: Paula CohoRhea Horne Stenberg is a 57 y.o. female patient referred to lipid clinic by Dr Anne FuSkains. PMH is significant for strong family history of premature CAD, chest pain, anxiety depression, and statin intolerance. CT scan 06/2018 revealed nonobstructive calcified plaque in the distal left main and a calcium score of 19 which was 80th percentile when matched for age and gender. She was seen in lipid clinic in October and started on rosuvastatin 5mg  daily. She experienced severe muscle aches within 1-2 weeks and then was tried on Livalo 1mg  daily. After 1 month, she developed myalgias on this as well. She presents today for further management.  Pt presents today in good spirits. She took simvastatin for multiple year and reported some aches. She was switched to Crestor for more potent lipid lowering, and developed full body muscle aches and joint pain in her feet and hips. Her pain resolved when she stopped the Crestor. She has also tried Lipitor in the past and developed aches. Most recently, she rechallenged with low dose rosuvastatin 5mg  daily and Livalo 1mg  daily, however developed myalgias with each.  [facility] but has not received her new card yet. Also reports having GI issues which she thinks could be related to her cholesterol medication. She went to the ER who advised pt that she may have a kidneystone. She then saw a GI specialist who thought she may have an issue with her gallbladder. She has a follow up scan with GI on January 15th and does not want to start ay new medication until after her GI issues have resolved. She would also prefer to avoid injectable medication if possible.  Current Medications: fish oil 1000mg  daily Intolerances: simvastatin 20mg  daily, rosuvastatin 5mg  and 20mg  daily, atorvastatin 10mg  daily, Livalo 1mg  daily - full body aches Risk Factors:  family history of premature CAD, calcified plaque in distal left main, elevated calcium score LDL goal: 70mg /dL  Diet:  Breakfast - oatmeal with fruit and walnuts Lunch - Malawiturkey sandwich or salad with quinoa Dinner - fish, chicken mostly. Limited red meat. Eats a vegetable and starch as well (brown rice or quinoa) Drinks water and unsweet tea, mixed drink or red wine each night  Exercise: Pilates, paddleboarding and kayaking.  Family History: Brother had CABG at age 59, mother with CHF and DM, father with CAD and 2 stents.  Social History: Denies tobacco use, drinks 6-7 glasses of wine each week, denies illicit drug use.  Labs: 09/29/18: TC 244, TG 137, HDL 88, LDL 129 (no lipid lowering therapy)  Past Medical History:  Diagnosis Date  . Allergic rhinitis   . Anxiety   . Anxiety and depression   . ASCUS of cervix with negative high risk HPV 09/2018  . Asthma   . Depression   . Hyperlipidemia   . Kidney stone   . Menopause     Current Outpatient Medications on File Prior to Visit  Medication Sig Dispense Refill  . azelastine (ASTELIN) 0.1 % nasal spray Place 2 sprays into both nostrils 2 (two) times daily. Use in each nostril as directed (Patient not taking: Reported on 12/15/2018) 30 mL 5  . Biotin 1000 MCG tablet Take 1,000 mcg by  mouth daily.     . budesonide-formoterol (SYMBICORT) 160-4.5 MCG/ACT inhaler Inhale 2 puffs into the lungs 2 (two) times daily. 1 Inhaler 5  . cetirizine (ZYRTEC) 10 MG tablet Take 10 mg by mouth daily as needed.     . Cholecalciferol (VITAMIN D) 2000 UNITS tablet Take 2,000 Units by mouth daily.      . clonazePAM (KLONOPIN) 1 MG tablet TAKE 1 TABLET BY MOUTH TWICE DAILY (Patient taking differently: Take 1 mg by mouth as needed. ) 60 tablet 5  . Coenzyme Q10 200 MG capsule Take 200 mg by mouth daily.     Marland Kitchen. levalbuterol (XOPENEX HFA) 45 MCG/ACT inhaler Inhale 2 puffs into the lungs every 6 (six) hours as needed for wheezing. 15 g 1  . MAGNESIUM  CARBONATE PO Take 1 tablet by mouth daily.     . Misc Natural Products (TART CHERRY ADVANCED) CAPS Take 1 capsule by mouth daily.    . montelukast (SINGULAIR) 10 MG tablet TAKE 1 TABLET(10 MG) BY MOUTH DAILY 30 tablet 5  . Multiple Vitamin (MULTIVITAMIN) tablet Take 1 tablet by mouth daily.    . Omega-3 Fatty Acids (FISH OIL) 1000 MG CAPS Take by mouth.    . pantoprazole (PROTONIX) 40 MG tablet TAKE 1 TABLET BY MOUTH EVERY DAY FOR REFLUX 90 tablet 3  . Saccharomyces boulardii (PROBIOTIC) 250 MG CAPS Take 1 capsule by mouth daily.    . sertraline (ZOLOFT) 50 MG tablet TAKE 1 TABLET(50 MG) BY MOUTH DAILY 90 tablet 3  . TURMERIC PO Take 1 capsule by mouth 2 (two) times daily.      No current facility-administered medications on file prior to visit.     Allergies  Allergen Reactions  . Crestor [Rosuvastatin Calcium] Other (See Comments)  . Fluticasone-Salmeterol Hives    REACTION: hives REACTION: hives  . Scallops [Shellfish Allergy] Hives    Assessment/Plan:  1. Hyperlipidemia - Baseline LDL 129 above aggressive goal < 70 due to family history of CAD and elevated calcium score. Pt is now intolerant to 4 statins including lowest starting dose of rosuvastatin and pitavastatin. She does not want to start any new cholesterol medication until her GI issues have resolved. Discussed lipid lowering options including Repatha (which I encouraged as this would be the most effective, however she is very hesitant to try an injection), pravastatin 10mg  daily, or Zetia 10mg  daily. She will call clinic after f/u with GI to let us know which option she would like to pursue.   Shourya Macpherson E. Jolynda Townley, PharmD, BCACP, CPP [facility] [address], [place], [place] [address] Phone: [phone]; Fax: [phone] 12/26/2018 7:18 AM

## 2018-12-26 NOTE — Patient Instructions (Addendum)
Your LDL is currently 129 and your goal is < 70  Medication options to try include: -Pravastatin 10mg  daily - pill option, similar to other 4 statins you have tried and developed myalgias on. Lower doses will lower your LDL cholesterol 20-30%, prevents heart attacks and strokes.  -Zetia 10mg  daily - pill option, can cause GI issues. Lowers LDL cholesterol 20%, does not have data showing that it would prevent heart attacks or strokes  -Repatha 140mg  - injection every 2 weeks, would be the most effective option (lowers LDL 60%, prevents heart attacks and strokes) and is very well tolerated. Would need your new [employer] insurance card in order to get coverage, but the cost would be $5 per month after this.  Call Aundra MilletMegan #[phone] when you decide which option you would like to try

## 2018-12-29 ENCOUNTER — Ambulatory Visit (HOSPITAL_COMMUNITY): Payer: BLUE CROSS/BLUE SHIELD

## 2019-01-10 ENCOUNTER — Ambulatory Visit (HOSPITAL_COMMUNITY)
Admission: RE | Admit: 2019-01-10 | Discharge: 2019-01-10 | Disposition: A | Discharge status: Home / Self Care | Payer: 59 | Source: Ambulatory Visit | Attending: Gastroenterology | Admitting: Gastroenterology

## 2019-01-10 DIAGNOSIS — R1011 Right upper quadrant pain: Secondary | ICD-10-CM

## 2019-01-10 DIAGNOSIS — R1013 Epigastric pain: Secondary | ICD-10-CM | POA: Diagnosis not present

## 2019-01-10 MED ORDER — TECHNETIUM TC 99M MEBROFENIN IV KIT
4.9000 | PACK | Freq: Once | INTRAVENOUS | Status: AC | PRN
  Administered 2019-01-10: 4.9 via INTRAVENOUS

## 2019-01-15 DIAGNOSIS — H1820 Unspecified corneal edema: Secondary | ICD-10-CM | POA: Diagnosis not present

## 2019-01-25 ENCOUNTER — Other Ambulatory Visit: Payer: 59 | Admitting: Internal Medicine

## 2019-01-25 DIAGNOSIS — J45909 Unspecified asthma, uncomplicated: Secondary | ICD-10-CM

## 2019-01-25 DIAGNOSIS — E7849 Other hyperlipidemia: Secondary | ICD-10-CM | POA: Diagnosis not present

## 2019-01-25 DIAGNOSIS — F321 Major depressive disorder, single episode, moderate: Secondary | ICD-10-CM

## 2019-01-25 DIAGNOSIS — M5416 Radiculopathy, lumbar region: Secondary | ICD-10-CM

## 2019-01-25 DIAGNOSIS — Z0000 Encounter for general adult medical examination without abnormal findings: Secondary | ICD-10-CM

## 2019-01-25 DIAGNOSIS — R7302 Impaired glucose tolerance (oral): Secondary | ICD-10-CM | POA: Diagnosis not present

## 2019-01-25 DIAGNOSIS — K219 Gastro-esophageal reflux disease without esophagitis: Secondary | ICD-10-CM

## 2019-01-25 NOTE — Addendum Note (Signed)
Addended by: Gregery Na on: 01/25/2019 10:02 AM   Modules accepted: Orders

## 2019-01-26 LAB — CBC WITH DIFFERENTIAL/PLATELET
Absolute Monocytes: 525 cells/uL (ref 200–950)
Basophils Absolute: 31 cells/uL (ref 0–200)
Basophils Relative: 0.6 %
Eosinophils Absolute: 107 cells/uL (ref 15–500)
Eosinophils Relative: 2.1 %
HCT: 41.8 % (ref 35.0–45.0)
Hemoglobin: 14.3 g/dL (ref 11.7–15.5)
Lymphs Abs: 1153 cells/uL (ref 850–3900)
MCH: 31 pg (ref 27.0–33.0)
MCHC: 34.2 g/dL (ref 32.0–36.0)
MCV: 90.5 fL (ref 80.0–100.0)
MPV: 10.7 fL (ref 7.5–12.5)
Monocytes Relative: 10.3 %
Neutro Abs: 3284 cells/uL (ref 1500–7800)
Neutrophils Relative %: 64.4 %
Platelets: 260 10*3/uL (ref 140–400)
RBC: 4.62 10*6/uL (ref 3.80–5.10)
RDW: 12.3 % (ref 11.0–15.0)
Total Lymphocyte: 22.6 %
WBC: 5.1 10*3/uL (ref 3.8–10.8)

## 2019-01-26 LAB — COMPLETE METABOLIC PANEL WITH GFR
AG Ratio: 1.4 (calc) (ref 1.0–2.5)
ALT: 21 U/L (ref 6–29)
AST: 19 U/L (ref 10–35)
Albumin: 4.1 g/dL (ref 3.6–5.1)
Alkaline phosphatase (APISO): 61 U/L (ref 33–130)
BUN: 14 mg/dL (ref 7–25)
CO2: 25 mmol/L (ref 20–32)
Calcium: 9.8 mg/dL (ref 8.6–10.4)
Chloride: 104 mmol/L (ref 98–110)
Creat: 0.67 mg/dL (ref 0.50–1.05)
GFR, Est African American: 112 mL/min/{1.73_m2} (ref >=60)
GFR, Est Non African American: 96 mL/min/{1.73_m2} (ref >=60)
Globulin: 2.9 g/dL (calc) (ref 1.9–3.7)
Glucose, Bld: 111 mg/dL — ABNORMAL HIGH (ref 65–99)
Potassium: 4.2 mmol/L (ref 3.5–5.3)
Sodium: 140 mmol/L (ref 135–146)
Total Bilirubin: 0.6 mg/dL (ref 0.2–1.2)
Total Protein: 7 g/dL (ref 6.1–8.1)

## 2019-01-26 LAB — LIPID PANEL
Cholesterol: 239 mg/dL — ABNORMAL HIGH (ref <200)
HDL: 73 mg/dL (ref >50)
LDL Cholesterol (Calc): 141 mg/dL (calc) — ABNORMAL HIGH
Non-HDL Cholesterol (Calc): 166 mg/dL (calc) — ABNORMAL HIGH (ref <130)
Total CHOL/HDL Ratio: 3.3 (calc) (ref <5.0)
Triglycerides: 125 mg/dL (ref <150)

## 2019-01-26 LAB — TSH: TSH: 2.77 mIU/L (ref 0.40–4.50)

## 2019-01-26 LAB — HEMOGLOBIN A1C
Hgb A1c MFr Bld: 5.5 % of total Hgb (ref <5.7)
Mean Plasma Glucose: 111 (calc)
eAG (mmol/L): 6.2 (calc)

## 2019-01-26 LAB — VITAMIN D 25 HYDROXY (VIT D DEFICIENCY, FRACTURES): Vit D, 25-Hydroxy: 28 ng/mL — ABNORMAL LOW (ref 30–100)

## 2019-01-29 ENCOUNTER — Encounter: Payer: Self-pay | Admitting: Internal Medicine

## 2019-01-29 ENCOUNTER — Ambulatory Visit (INDEPENDENT_AMBULATORY_CARE_PROVIDER_SITE_OTHER): Payer: 59 | Admitting: Internal Medicine

## 2019-01-29 VITALS — BP 130/90 | HR 72 | Ht 65.5 in | Wt 178.0 lb

## 2019-01-29 DIAGNOSIS — R1011 Right upper quadrant pain: Secondary | ICD-10-CM | POA: Diagnosis not present

## 2019-01-29 DIAGNOSIS — E7800 Pure hypercholesterolemia, unspecified: Secondary | ICD-10-CM | POA: Diagnosis not present

## 2019-01-29 DIAGNOSIS — Z8709 Personal history of other diseases of the respiratory system: Secondary | ICD-10-CM

## 2019-01-29 DIAGNOSIS — F411 Generalized anxiety disorder: Secondary | ICD-10-CM

## 2019-01-29 DIAGNOSIS — Z0000 Encounter for general adult medical examination without abnormal findings: Secondary | ICD-10-CM

## 2019-01-29 LAB — POCT URINALYSIS DIPSTICK
Appearance: NEGATIVE
Bilirubin, UA: NEGATIVE
Blood, UA: NEGATIVE
Glucose, UA: NEGATIVE
Ketones, UA: NEGATIVE
Leukocytes, UA: NEGATIVE
Nitrite, UA: NEGATIVE
Odor: NEGATIVE
Protein, UA: NEGATIVE
Spec Grav, UA: 1.015 (ref 1.010–1.025)
Urobilinogen, UA: 0.2 E.U./dL
pH, UA: 6.5 (ref 5.0–8.0)

## 2019-02-01 ENCOUNTER — Telehealth: Payer: Self-pay

## 2019-02-01 NOTE — Telephone Encounter (Signed)
-----   Message from Ethel Rana sent at 02/01/2019  3:15 PM EST ----- Regarding: RE: Referral Appt 02/08/19 11;20 Dr.Rairez left vm  ----- Message ----- From: Chrystie Nose, RN Sent: 01/31/2019  10:57 AM EST To: Ethel Rana Subject: Referral                                       Checking on referral that was faxed 01/15/19 for gallbladder surgery.

## 2019-02-17 NOTE — Progress Notes (Signed)
Subjective:    Patient ID: Paula Bradley, female    DOB: 03-29-1961, 58 y.o.   MRN: [id number]  HPI 58 year old Female with history of anxiety in today for health maintenance exam and evaluation of medical issues.  Says her husband is having her go to [facility] for complete physical in February.  Over the past year has had issues with recurrent abdominal pain.  Has been seen by Dr. Myrtie Neither at [facility] gastroenterology.  Patient indicated the pain was acute onset in the epigastric area radiating to her right upper quadrant and around to her right back.  She has had a negative ultrasound.  History of allergic rhinitis and chronic dry cough possibly GERD related.  Has been seen by allergist.  History of hyperlipidemia.  Had cardiac CTA with calcium score being 19 Agataton units placing her in the 80th percentile for age and gender.  This alarmed her.  She saw Dr. Anne Fu because she was alarmed as a close friend had died suddenly after an MI.  She has a strong family history of coronary disease and a brother with history of CABG at age 68, mother with history of congestive heart failure in father with history of coronary disease status post 2 stents.  Has complained at times of chest pain which was severe substernal and epigastric. Was found to have a 1/6 systolic ejection murmur.  Echocardiogram was ordered.  Echo showed normal ejection fracture and no significant valvular abnormalities.  Flow murmur was noted.  She was doing well on simvastatin 20 mg daily but after calcium score was discovered to be elevated she was switched by pharmacist at lipid clinic to Crestor with the idea that it had more potent lipid-lowering capability.  However she developed myalgias and joint pain which resolved when she stopped Crestor.  Patient says she had previously tried Lipitor in the past and developed same symptoms.  And she subsequently tried Crestor 5 mg daily and Livalo 1 mg daily and developed  myalgias also.  Possibility of starting Repatha was discussed with her.  She has been hesitant to pursue this.  Also mentioned were pravastatin and Zetia.  We went through a long discussion about her visit to the pharmacist and what she should do.  She really does need to be on a lipid-lowering agent that she can tolerate and it may be Repatha is the best option.  She will go back to cardiology and see what she should do about this.  She is extremely anxious about her cardiac situation.  Currently total cholesterol is 239 and LDL cholesterol 141 off statin.  HDL is 73 and triglycerides are 125.  TSH is normal.  Vitamin D is low at 28.  Fasting glucose is 111.  Kidney functions are normal.  Liver functions are normal.  Hemoglobin A1c 5.5%  She had a hepatobiliary scan ordered by Dr. Myrtie Neither in January showing normal ejection fraction.  A CT scan had been ordered in the fall 2019 but apparently there was some issue with her insurance paying for that study.  She had upper endoscopy December 2019 showing multiple sessile fundic gland polyps and atrophic mucosa in the gastric antrum.  Biopsies were taken.  No H. pylori identified.  Mild reactive gastropathy noted.  No metaplasia or cancer identified.  Was checked in September 2019 for pancreatitis and had normal amylase and lipase with episode of abdominal pain.  Apparently she had an episode of right upper quadrant pain at the beach in  August 2019 very similar to her previous 2 episodes.  First episode occurred in 2015.  In the fall 2018 she had an episode in [place] and came home for evaluation.  Was seen at [facility] emergency facility.  Had chest x-ray and ultrasound of the abdomen.  No gallstones were noted.  Fatty liver was noted.  It was thought she might have a small stone in her right kidney but no hydronephrosis.  Subsequently she saw urologist who did not think she had a kidney stone.  During that evaluation in the emergency department  her lipase was normal and urinalysis showed no hematuria.  She was fine until beach trip in August 2019 where she had a lot of family members coming into visit.  Ultrasound done in [facility] suggested a kidney stone.  Had negative H. pylori breath test through this office. Says these attacks of severe right upper quadrant pain are very severe and she cannot get any relief.  Says pain radiates to her right-sided back area.  No documented fever or chills with these episodes.  Had colonoscopy 2011 with 1 small polyp removed that was benign and not adenomatous.  Would like for her to have CT of abdomen and pelvis but apparently there is been some issue with her insurance company not approving this.  I think she could afford to pay out-of-pocket for it.  Also in August she fell on a slippery dog at the beach and landed on her buttocks.  Had hip and sacral pain with negative x-rays.  Was tender over left trochanter and I injected it with Marcaine and Xylocaine.  Thought she had trochanteric bursitis.  She also had a sacral contusion at that time.  Had CT of the chest August 2019 to evaluate cough which was negative.  In January 2019 she was diagnosed with pneumonia after developing flulike illness during a ski trip to [place] over the Christmas holidays .  It took her some time to get over this.  She was very anxious.  Had not had flu vaccine.  History of asthma seen by allergist on a regular basis.  Social history: She is married.  2 adult children.  Husband owns [employer] and [employer]  She is a Futures trader.  Social alcohol consumption but has cut back on wine consumption considerably she says.  Has been exercising.  Does not smoke.  Family history: Strong family history of coronary disease in brother father and mother.  Review of Systems see above    Objective:   Physical Exam Vitals signs reviewed.  Constitutional:      General: She is not in acute  distress.    Appearance: Normal appearance. She is not ill-appearing.  HENT:     Head: Normocephalic and atraumatic.     Right Ear: Tympanic membrane normal.     Left Ear: Tympanic membrane normal.     Nose: Nose normal.     Mouth/Throat:     Mouth: Mucous membranes are moist.     Pharynx: Oropharynx is clear.  Eyes:     General: No scleral icterus.    Extraocular Movements: Extraocular movements intact.     Conjunctiva/sclera: Conjunctivae normal.     Pupils: Pupils are equal, round, and reactive to light.  Neck:     Musculoskeletal: Neck supple.     Comments: No thyromegaly Cardiovascular:     Rate and Rhythm: Normal rate and regular rhythm.     Pulses:  Normal pulses.     Heart sounds: Murmur present.     Comments: 1/6 systolic ejection murmur Pulmonary:     Effort: Pulmonary effort is normal. No respiratory distress.     Breath sounds: Normal breath sounds. No wheezing or rales.     Comments: Breast without masses Abdominal:     General: Bowel sounds are normal.     Palpations: Abdomen is soft. There is no mass.     Tenderness: There is no abdominal tenderness. There is no guarding or rebound.  Musculoskeletal:     Right lower leg: No edema.     Left lower leg: No edema.  Skin:    General: Skin is warm and dry.  Neurological:     General: No focal deficit present.     Mental Status: She is alert and oriented to person, place, and time.     Cranial Nerves: No cranial nerve deficit.     Sensory: No sensory deficit.     Motor: No weakness.     Coordination: Coordination normal.     Gait: Gait normal.  Psychiatric:        Mood and Affect: Mood normal.        Thought Content: Thought content normal.        Judgment: Judgment normal.     Comments: She is very anxious about her health at this point           Assessment & Plan:  Hyperlipidemia-intolerant of many statins.  Calcium score 19.  May need to be on Repatha.  She will revisit this issue with cardiologist  and lipid clinic.  History of asthma treated by allergist  History of recurrent right upper quadrant abdominal pain with negative hepatobiliary scan and ultrasound.  Needs CT of abdomen and pelvis for complete work-up but this is not been approved by [employer] company.  Anxiety-worried about her health due to her strong family history of coronary disease.  Worried that she will go on the trip and have another episode of epigastric/right upper quadrant abdominal pain.  History of obesity-has lost weight by cutting out wine consumption and exercising  History of GE reflux/cough treated with PPI  Plan: Patient says she is going to [facility] for an executive health physical exam.  This is been scheduled for the near future.  Asked her to revisit cardiology for more information about her lipid management.  He needs to have aggressive lipid-lowering therapy and I agree with cardiologist's evaluation and recommendations.  We can follow-up with her in 6 months or as needed.  Currently has Klonopin to take as needed anxiety.

## 2019-02-17 NOTE — Patient Instructions (Signed)
Please consider paying out of pocket for CT of abdomen and pelvis.  Return to cardiology office to discuss lipid management.  Return here in 6 months for follow-up.

## 2019-02-19 ENCOUNTER — Other Ambulatory Visit: Payer: Self-pay | Admitting: Allergy

## 2019-03-19 ENCOUNTER — Telehealth: Payer: Self-pay | Admitting: Allergy

## 2019-03-19 NOTE — Telephone Encounter (Signed)
Patient wants to know if she needs to up any of her meds because of the virus.

## 2019-03-19 NOTE — Telephone Encounter (Signed)
Spoke with patient and advised that there are no changes that need to be made to medications.  Advised to monitor symptoms and seek attention if she is in respiratory distress.  Patient voiced understanding and will call with further questions.

## 2019-03-21 ENCOUNTER — Other Ambulatory Visit: Payer: Self-pay | Admitting: Internal Medicine

## 2019-03-29 ENCOUNTER — Telehealth: Payer: Self-pay | Admitting: Allergy

## 2019-03-29 MED ORDER — PREDNISONE 10 MG PO TABS: 20.0000 mg | ORAL_TABLET | Freq: Two times a day (BID) | ORAL | 0 refills | Status: AC

## 2019-03-29 NOTE — Telephone Encounter (Signed)
Please advise 

## 2019-03-29 NOTE — Telephone Encounter (Signed)
Informed patient that Prednisone has been sent into the pharmacy.

## 2019-03-29 NOTE — Telephone Encounter (Signed)
Patient has asthma Patient is at the beach Patient would like prednisone called in to [place] [facility] [place] Patient is worried about getting out and going places anytime soon Can this be called in so in case she needs it later she will have it on hand and not have to go out Please call if there are any questions

## 2019-03-29 NOTE — Telephone Encounter (Signed)
Yes that is fine.  She had an terrible year last year with her asthma and getting PNA that was very slow to resolve.   Can send in prednisone 20mg  BID x 5 day with no refills

## 2019-05-02 ENCOUNTER — Encounter: Payer: Self-pay | Admitting: Allergy

## 2019-05-02 ENCOUNTER — Ambulatory Visit: Payer: 59 | Admitting: Allergy

## 2019-05-02 ENCOUNTER — Other Ambulatory Visit: Payer: Self-pay

## 2019-05-02 ENCOUNTER — Ambulatory Visit (INDEPENDENT_AMBULATORY_CARE_PROVIDER_SITE_OTHER): Payer: 59 | Admitting: Allergy

## 2019-05-02 DIAGNOSIS — J4540 Moderate persistent asthma, uncomplicated: Secondary | ICD-10-CM

## 2019-05-02 DIAGNOSIS — K219 Gastro-esophageal reflux disease without esophagitis: Secondary | ICD-10-CM | POA: Diagnosis not present

## 2019-05-02 DIAGNOSIS — J3089 Other allergic rhinitis: Secondary | ICD-10-CM | POA: Diagnosis not present

## 2019-05-02 NOTE — Progress Notes (Signed)
Start time:  1115 Finish Time:  1139 Where are you located:  [facility] [place] Do you give [place] permission to bill your insurance:  yes Are you signed up for my chart:  yes  Pt states that when the pollen was bad, she had to add her Qvar in and needed her prednisone used that for one day.

## 2019-05-02 NOTE — Patient Instructions (Addendum)
  1. Continue Symbicort 160mg  2 puffs once a day.  Advised to increase to twice a day if she develops respiratory symptoms in the evenings or overnight, if she is not meeting the below goals or if her average peak flow decreases below 350 L.   Control goals:   Full participation in all desired activities (may need albuterol before activity)  Albuterol use two time or less a week on average (not counting use with activity)  Cough interfering with sleep two time or less a month  Oral steroids no more than once a year  No hospitalizations  2.  Use Xopenex 2 puffs every 4-6 hours as needed for cough/wheeze/shortness of breath/chest tightness.   May use 15-20 minutes prior to exercise/activity if needed.  Monitor frequency of use.     3.  During asthma flares/respiratory illnesses add in Qvar 2 puffs twice a day with your maintenance Symbicort.  Once symptoms have resolved can stop the Qvar.    4. Zyrtec or Allergra daily as needed  5. For nasal drainage use nasal antihistamine, Astelin 2 sprays each nostril 1-2 times a day.  Use with proper nasal spray technique (point tip of spray toward ear of same side nostril).   For nasal congestion use nasal steroid spray like Nasocort or Flonase 2 sprays each nostril daily if needed (use for 1-2 weeks at a time before stopping once symptoms improve).     Return to clinic in 4 months or sooner if needed

## 2019-05-02 NOTE — Progress Notes (Signed)
RE: Paula Bradley MRN: [id number] DOB: February 05, 1961 Date of Telemedicine Visit: 05/02/2019  Referring provider: Margaree MackintoshBaxley, Mary J, MD Primary care provider: Margaree MackintoshBaxley, Mary J, MD  Chief Complaint: Asthma (see note); Allergic Rhinitis  (doing okay); and Food Intolerance (scallops, no accidental exposures)   Telemedicine Follow Up Visit via Telephone: I connected with Paula Bradley for a follow up on 05/02/19 by telephone and verified that I am speaking with the correct person using two identifiers.   I discussed the limitations, risks, security and privacy concerns of performing an evaluation and management service by telephone and the availability of in person appointments. I also discussed with the patient that there may be a patient responsible charge related to this service. The patient expressed understanding and agreed to proceed.  Patient is at home.  Provider is at the office.  Visit start time: 1115 Visit end time: [id number] Insurance consent/check in by: Marlene BastMarie C Medical consent and medical assistant/nurse: Darreld Mcleanrina S  History of Present Illness: She is a 58 y.o. female, who is being followed for moderate persistent asthma, allergic rhinitis and LPRD. Her previous allergy office visit was on 12/15/2018 with Dr. Delorse LekPadgett.   She states she has been doing well.  She has [place] during the issues surrounding coronavirus.  She states earlier in spring when the pollen was heavier she was having a bit more issues with her asthma and allergies with having more cough symptoms.  She states she did require use of her prednisone but only required 1 to 2 tablets to improve her symptoms.  She also added her Qvar and for several days to her Symbicort which is her maintenance inhaler.  She states she is doing her Symbicort just 2 puffs once a day in the mornings.  She denies having any symptoms arise in the evenings, overnight or in the morning.  She states she has not needed  to use her inhaler rescue inhaler.  She has stopped use of the Qvar since her symptoms have resolved.  She states that she has been at her beach house and with the corona virus issue she has been using her peak flow daily and getting values averaging about 390 to 400 L.  She is also doing her temperature daily and has not been febrile.  She states she is not taking Singulair anymore and states she does not really notice a difference being off singular versus when she was on Singulair. She states that she is taking Zyrtec only as needed at this time.  She states she has not had any nasal symptoms to require use of her nasal sprays. She states she no longer requires use of her Protonix or ranitidine. She overall says she is doing quite well at this time.  Assessment and Plan: Paula Bradley is a 58 y.o. female with: Moderate persistent asthma -currently doing well Allergic rhinitis -currently doing well  LPRD -currently asymptomatic and has been able to wean off of her PPI and H2 blocker  1. Continue Symbicort 160mg  2 puffs once a day.  Advised to increase to twice a day if she develops respiratory symptoms in the evenings or overnight, if she is not meeting the below goals or if her average peak flow decreases below 350 L.   Control goals:   Full participation in all desired activities (may need albuterol before activity)  Albuterol use two time or less a week on average (not counting use with activity)  Cough interfering with  sleep two time or less a month  Oral steroids no more than once a year  No hospitalizations  2.  Use Xopenex 2 puffs every 4-6 hours as needed for cough/wheeze/shortness of breath/chest tightness.   May use 15-20 minutes prior to exercise/activity if needed.  Monitor frequency of use.     3.  During asthma flares/respiratory illnesses add in Qvar 2 puffs twice a day with your maintenance Symbicort.  Once symptoms have resolved can stop the Qvar.    4. Zyrtec or Allergra  daily as needed  5. For nasal drainage use nasal antihistamine, Astelin 2 sprays each nostril 1-2 times a day.  Use with proper nasal spray technique (point tip of spray toward ear of same side nostril).   For nasal congestion use nasal steroid spray like Nasocort or Flonase 2 sprays each nostril daily if needed (use for 1-2 weeks at a time before stopping once symptoms improve).     Return to clinic in 4 months or sooner if needed  Diagnostics: None.  Medication List:  Current Outpatient Medications  Medication Sig Dispense Refill  . albuterol (VENTOLIN HFA) 108 (90 Base) MCG/ACT inhaler Inhale into the lungs.    . beclomethasone (QVAR) 40 MCG/ACT inhaler Use prn asthma flares 2 puffs twice a day    . cetirizine (ZYRTEC) 10 MG tablet Take 10 mg by mouth daily as needed.     . Cholecalciferol (VITAMIN D) 2000 UNITS tablet Take 2,000 Units by mouth daily.      . clonazePAM (KLONOPIN) 1 MG tablet TAKE 1 TABLET BY MOUTH TWICE DAILY (Patient taking differently: Take 1 mg by mouth as needed. ) 60 tablet 5  . Coenzyme Q10 200 MG capsule Take 200 mg by mouth daily.     Marland Kitchen levalbuterol (XOPENEX HFA) 45 MCG/ACT inhaler Inhale 2 puffs into the lungs every 6 (six) hours as needed for wheezing. 15 g 1  . Misc Natural Products (TART CHERRY ADVANCED) CAPS Take 1 capsule by mouth daily.    . Multiple Vitamin (MULTIVITAMIN) tablet Take 1 tablet by mouth daily.    . Omega-3 Fatty Acids (FISH OIL) 1000 MG CAPS Take by mouth.    . pravastatin (PRAVACHOL) 10 MG tablet TK 1 T PO NIGHTLY    . Saccharomyces boulardii (PROBIOTIC) 250 MG CAPS Take 1 capsule by mouth daily.    . sertraline (ZOLOFT) 50 MG tablet TAKE 1 TABLET(50 MG) BY MOUTH DAILY 90 tablet 3  . SYMBICORT 160-4.5 MCG/ACT inhaler INHALE 2 PUFFS INTO THE LUNGS TWICE DAILY 10.2 g 5  . TURMERIC PO Take 1 capsule by mouth 2 (two) times daily.     Marland Kitchen azelastine (ASTELIN) 0.1 % nasal spray Place 2 sprays into both nostrils 2 (two) times daily. Use in each  nostril as directed (Patient not taking: Reported on 05/02/2019) 30 mL 5  . montelukast (SINGULAIR) 10 MG tablet TAKE 1 TABLET(10 MG) BY MOUTH DAILY (Patient not taking: Reported on 05/02/2019) 30 tablet 5   No current facility-administered medications for this visit.    Allergies: Allergies  Allergen Reactions  . Statins     Myalgias on simvastatin 20mg  daily, rosuvastatin 5mg  and 20mg  daily, atorvastatin 10mg  daily, and pitavastatin 1mg  daily  . Fluticasone-Salmeterol Hives    REACTION: hives REACTION: hives  . Scallops [Shellfish Allergy] Hives   I reviewed her past medical history, social history, family history, and environmental history and no significant changes have been reported from previous visit on 12/15/18.  Review of  Systems  Constitutional: Negative for chills and fever.  HENT: Negative for congestion, postnasal drip, rhinorrhea and sneezing.   Eyes: Negative for pain, discharge and itching.  Respiratory: Negative for cough, chest tightness, shortness of breath and wheezing.   Cardiovascular: Negative.   Gastrointestinal: Negative.   Musculoskeletal: Negative for myalgias.  Skin: Negative for rash.  Neurological: Negative for headaches.   Objective: Physical Exam Not obtained as encounter was done via telephone.   Previous notes and tests were reviewed.  I discussed the assessment and treatment plan with the patient. The patient was provided an opportunity to ask questions and all were answered. The patient agreed with the plan and demonstrated an understanding of the instructions.   The patient was advised to call Bradley or seek an in-person evaluation if the symptoms worsen or if the condition fails to improve as anticipated.  I provided 24 minutes of non-face-to-face time during this encounter.  It was my pleasure to participate in Castine Mccannon's care today. Please feel free to contact me with any questions or concerns.   Sincerely,  Alonso Gapinski Larose Hires,  MD

## 2019-05-10 ENCOUNTER — Ambulatory Visit: Payer: 59 | Admitting: Allergy

## 2019-06-01 ENCOUNTER — Encounter: Payer: Self-pay | Admitting: Internal Medicine

## 2019-06-01 ENCOUNTER — Telehealth: Payer: Self-pay | Admitting: Internal Medicine

## 2019-06-01 ENCOUNTER — Other Ambulatory Visit: Payer: Self-pay

## 2019-06-01 DIAGNOSIS — F411 Generalized anxiety disorder: Secondary | ICD-10-CM

## 2019-06-01 MED ORDER — CLONAZEPAM 1 MG PO TABS: 1.0000 mg | ORAL_TABLET | Freq: Two times a day (BID) | ORAL | 2 refills | Status: AC

## 2019-06-01 NOTE — Telephone Encounter (Signed)
Request received to refill Klonopin. Was here for CPE in March. Have refilled this med x 6 months via [facility] with docu,emtation. Time spent 10 minutes. It ios a controlled Rx.

## 2019-06-06 ENCOUNTER — Telehealth: Payer: Self-pay | Admitting: *Deleted

## 2019-06-06 NOTE — Telephone Encounter (Signed)
Pt requested records be sent to [facility] for an apt on June 23.  I advised patient of Care Everywhere through Epic and her giving [place] permission to use it has already been given.  She will make sure [facility] has the same permission. I did also remind her that records can also be requested through [facility]. Advised to call back if needed more help. KW CMA

## 2019-06-14 ENCOUNTER — Encounter: Payer: Self-pay | Admitting: Internal Medicine

## 2019-06-28 NOTE — Addendum Note (Signed)
Addended by: Mady Haagensen on: 06/28/2019 08:40 AM   Modules accepted: Orders

## 2019-08-01 ENCOUNTER — Other Ambulatory Visit: Payer: Self-pay | Admitting: *Deleted

## 2019-08-01 ENCOUNTER — Telehealth: Payer: Self-pay | Admitting: *Deleted

## 2019-08-01 DIAGNOSIS — J4541 Moderate persistent asthma with (acute) exacerbation: Secondary | ICD-10-CM

## 2019-08-01 MED ORDER — LEVALBUTEROL TARTRATE 45 MCG/ACT IN AERO: 2.0000 | INHALATION_SPRAY | Freq: Four times a day (QID) | RESPIRATORY_TRACT | 0 refills | Status: DC | PRN

## 2019-08-01 NOTE — Telephone Encounter (Signed)
Received Qvar redihaler 80 denied refill due to patient being on diff strength.

## 2019-08-01 NOTE — Telephone Encounter (Signed)
PA submitted for Xopenex HFA due to anxiety and jitteriness in covermymeds

## 2019-08-02 NOTE — Telephone Encounter (Signed)
Received fax from [employer] covers Energy manager called pharmacy and still not going through Probation officer faxed letter to pharmacy and placed to be scanned

## 2019-09-03 IMAGING — CR DG CHEST 2V
2 series · 2 of 2 positions shown · non-contrast
Comparison: 11/16/2016

CLINICAL DATA: Chest pain

EXAM:
CHEST  2 VIEW

[w chest pa]
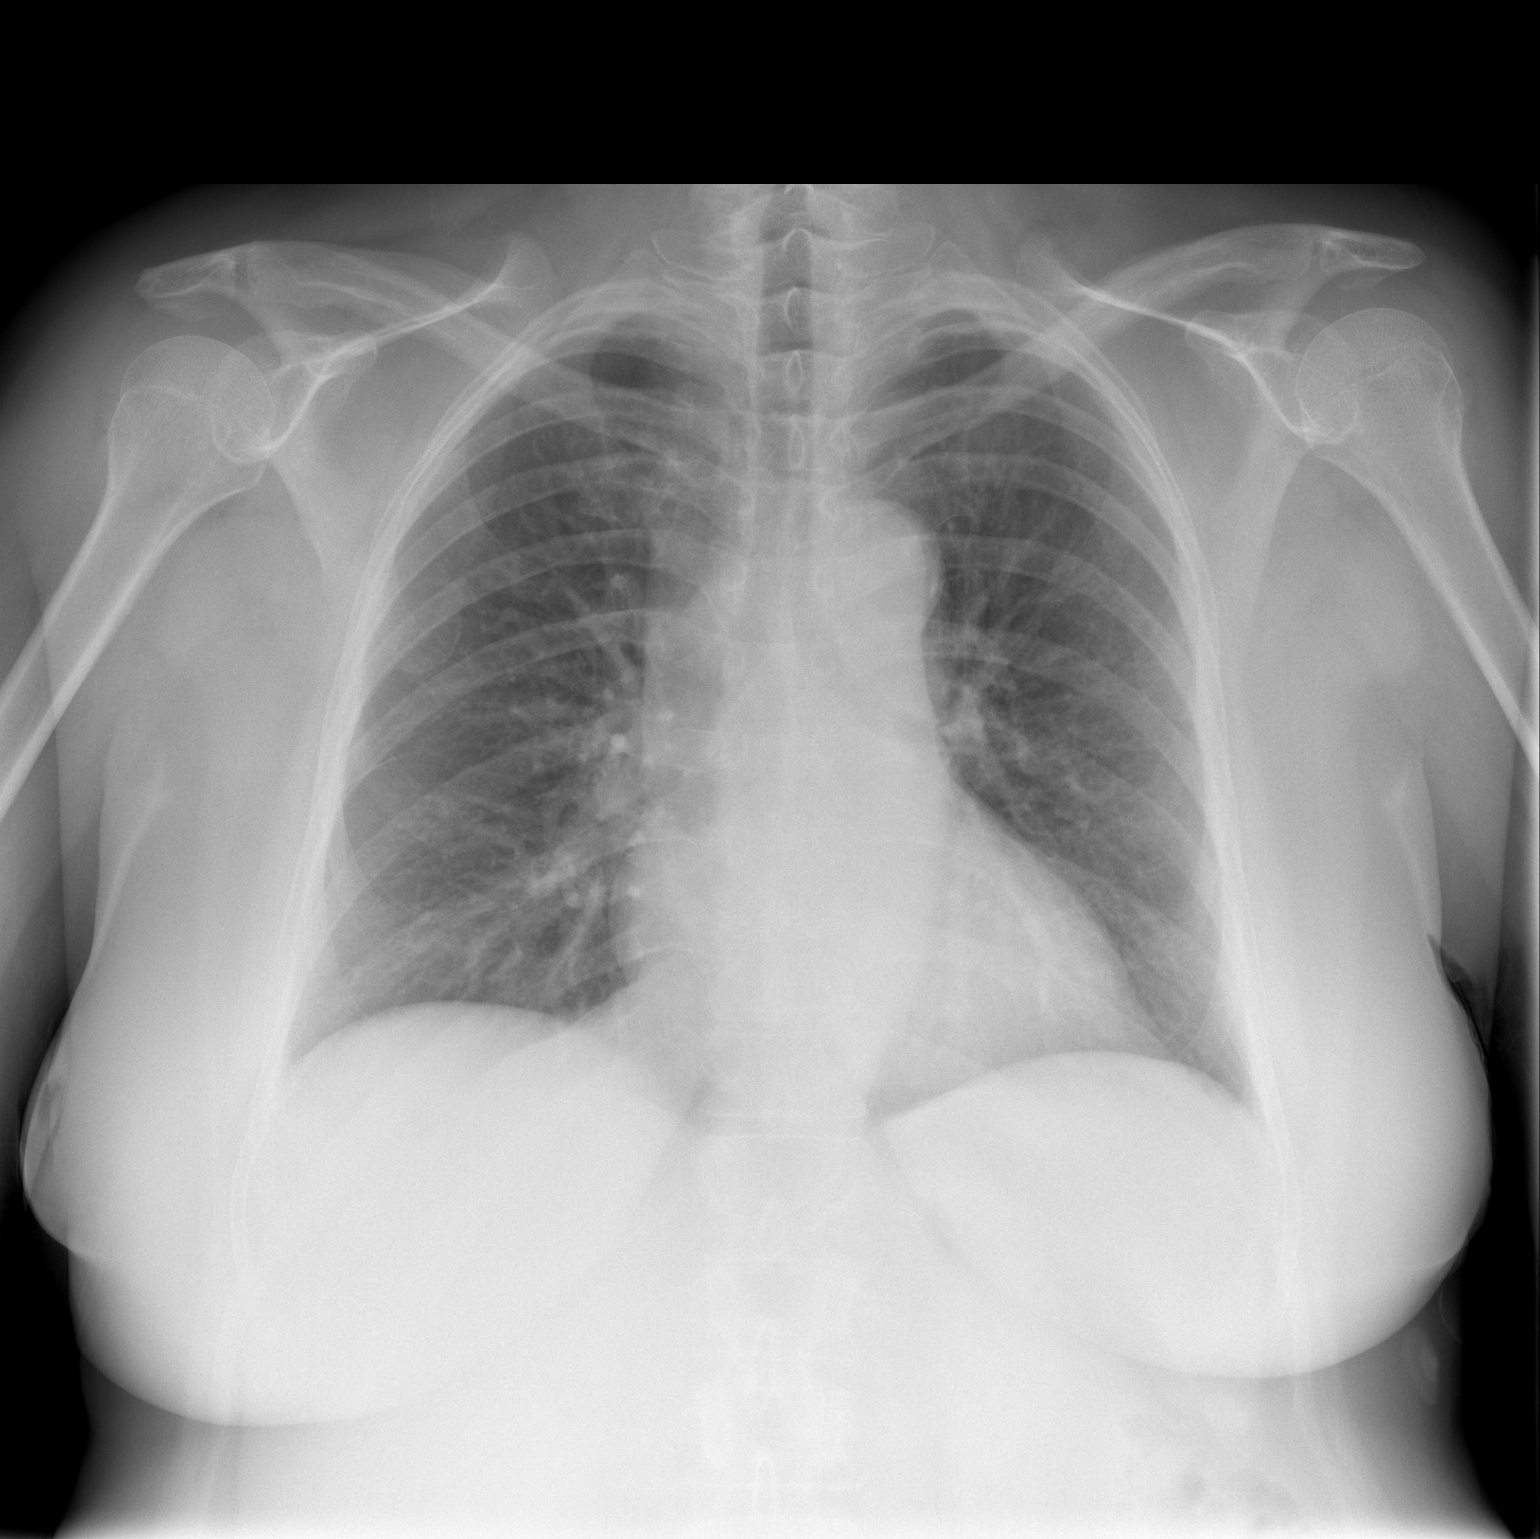

[w chest lat]
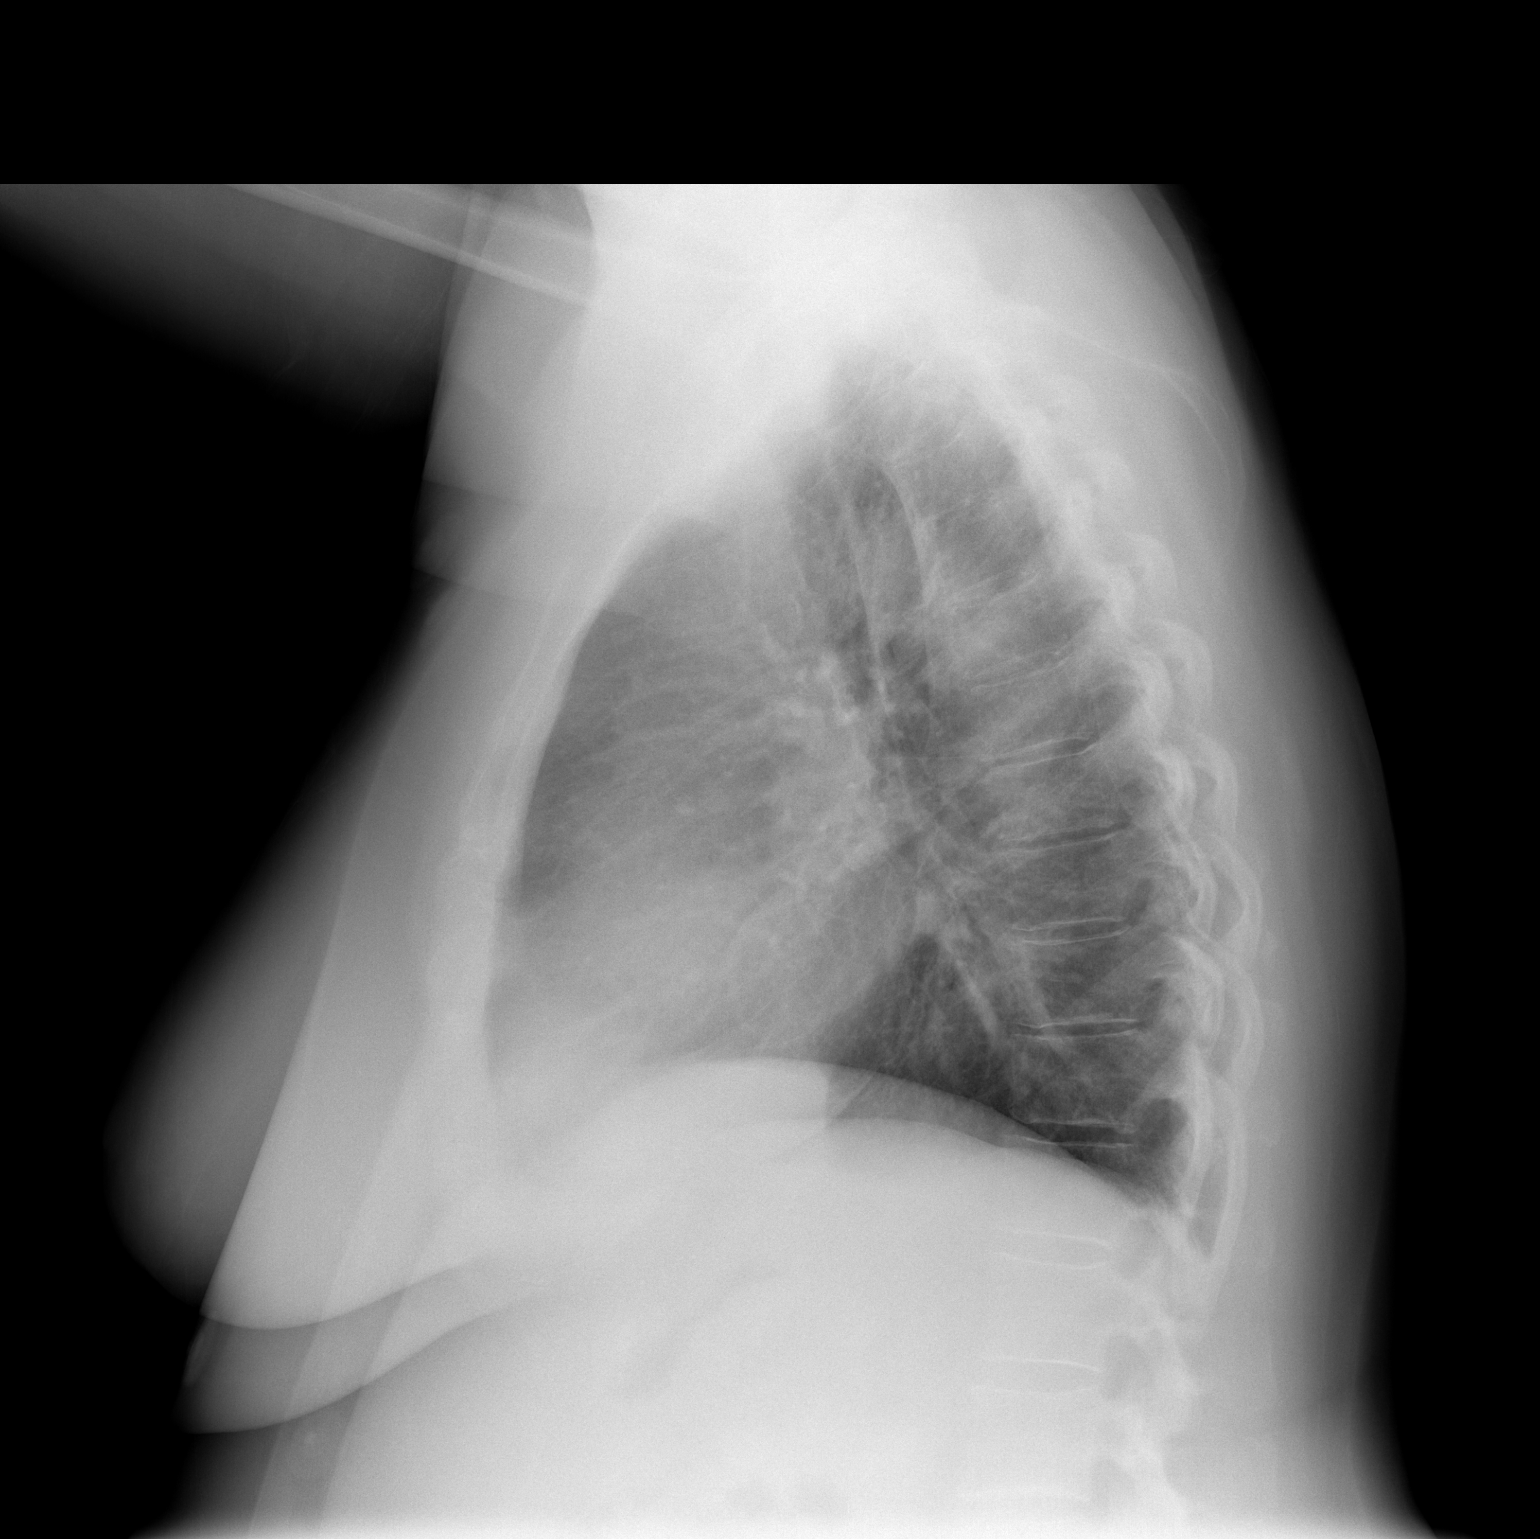

[2 of 2 positions shown; findings below may reference images not displayed]

FINDINGS: The heart size and mediastinal contours are within normal limits.
Both lungs are clear. The visualized skeletal structures are
unremarkable.
IMPRESSION: No active cardiopulmonary disease.

## 2019-09-05 ENCOUNTER — Ambulatory Visit (INDEPENDENT_AMBULATORY_CARE_PROVIDER_SITE_OTHER): Payer: 59 | Admitting: Allergy

## 2019-09-05 ENCOUNTER — Encounter: Payer: Self-pay | Admitting: Allergy

## 2019-09-05 ENCOUNTER — Other Ambulatory Visit: Payer: Self-pay

## 2019-09-05 VITALS — BP 138/76 | HR 68 | Temp 97.6°F | Resp 16 | Ht 65.0 in | Wt 191.0 lb

## 2019-09-05 DIAGNOSIS — K219 Gastro-esophageal reflux disease without esophagitis: Secondary | ICD-10-CM

## 2019-09-05 DIAGNOSIS — J3089 Other allergic rhinitis: Secondary | ICD-10-CM | POA: Diagnosis not present

## 2019-09-05 DIAGNOSIS — J4540 Moderate persistent asthma, uncomplicated: Secondary | ICD-10-CM

## 2019-09-05 MED ORDER — HYDROCOD POLST-CPM POLST ER 10-8 MG/5ML PO SUER: 5.0000 mL | Freq: Two times a day (BID) | ORAL | 0 refills | Status: DC

## 2019-09-05 NOTE — Patient Instructions (Signed)
  1. Continue Symbicort 160mg  2 puffs once a day.  Advised to increase to twice a day if she develops respiratory symptoms in the evenings or overnight, if she is not meeting the below goals or if her average peak flow decreases below 350 L.   Control goals:   Full participation in all desired activities (may need albuterol before activity)  Albuterol use two time or less a week on average (not counting use with activity)  Cough interfering with sleep two time or less a month  Oral steroids no more than once a year  No hospitalizations  2.  Use Xopenex 2 puffs every 4-6 hours as needed for cough/wheeze/shortness of breath/chest tightness.   May use 15-20 minutes prior to exercise/activity if needed.  Monitor frequency of use.     3.  During asthma flares/respiratory illnesses add in Qvar 33mcg 2 puffs twice a day with your maintenance Symbicort.  Once symptoms have resolved can stop the Qvar.    4. Continue Montelukast 10mg  daily  5. For nasal drainage use nasal antihistamine, Astelin 2 sprays each nostril 1-2 times a day.  Use with proper nasal spray technique (point tip of spray toward ear of same side nostril).   For nasal congestion use nasal steroid spray like Nasocort or Flonase 2 sprays each nostril daily if needed (use for 1-2 weeks at a time before stopping once symptoms improve).    6. Provided with prednisone pack to use only if having asthma flare.   Tussionex also refilled at this time for use if having any uncontrolled cough  Return to clinic in 3-4 months (Nov 2020) or sooner if needed

## 2019-09-05 NOTE — Progress Notes (Signed)
Follow-up Note  RE: Paula Bradley MRN: [id number] DOB: 05-Oct-1961 Date of Office Visit: 09/05/2019   History of present illness: Paula Bradley is a 58 y.o. female presenting today for follow-up of asthma, allergic rhinitis and LPRD.  She had a tele-visit last in May 02, 2019 by myself.  She states she is doing relatively well however her biggest complaint today is that she has been having some hoarseness since the summer.  She also states that she does have a bit of a cough however it has not been enough that she is requiring frequent albuterol use.  She also states that with prolonged use of wearing her mask that it does make her cough more.  She states she has been trying to do her activities and quick burst so that she does not have to wear the mask as much and she is staying home more.  She states she does use her Xopenex about 2-4 times a month on average.  She is taking Symbicort 160 mcg 2puffs twice a day.  She also will add in Qvar if she is having a flare.  She states around March or so that she did have increase in asthma symptoms and did use a prednisone course that improved her symptoms.  She added her Qvar at that time.  She is taking montelukast daily. With her hoarseness she states that she also has throat clearing a lot.  She states she has not thought to use the Astelin nose spray that she has at home. Currently denies any heartburn symptoms.  She is off of her PPI and H2 blocker at this time.  She states she was tested for COVID over the summer as she was in close contact with a person who was symptomatic and tested positive.  She was negative.  Review of systems: Review of Systems  Constitutional: Negative for chills, fever and malaise/fatigue.  HENT: Positive for sore throat. Negative for congestion, ear discharge, ear pain and nosebleeds.   Eyes: Negative for discharge and redness.  Respiratory: Positive for cough. Negative for sputum production, shortness of  breath and wheezing.   Cardiovascular: Negative for chest pain.  Gastrointestinal: Negative for abdominal pain, constipation, diarrhea, heartburn, nausea and vomiting.  Musculoskeletal: Negative for joint pain.  Skin: Negative for itching and rash.  Neurological: Negative for headaches.    All other systems negative unless noted above in HPI  Past medical/social/surgical/family history have been reviewed and are unchanged unless specifically indicated below.  No changes  Medication List: Allergies as of 09/05/2019      Reactions   Statins    Myalgias on simvastatin 20mg  daily, rosuvastatin 5mg  and 20mg  daily, atorvastatin 10mg  daily, and pitavastatin 1mg  daily   Fluticasone-salmeterol Hives   REACTION: hives REACTION: hives   Scallops [shellfish Allergy] Hives      Medication List       Accurate as of September 05, 2019  5:07 PM. If you have any questions, ask your nurse or doctor.        STOP taking these medications   albuterol 108 (90 Base) MCG/ACT inhaler Commonly known as: VENTOLIN HFA Stopped by:  Larose Hires, MD   pravastatin 10 MG tablet Commonly known as: PRAVACHOL Stopped by:  Larose Hires, MD   Probiotic 250 MG Caps Stopped by:  Larose Hires, MD     TAKE these medications   azelastine 0.1 % nasal spray Commonly known as: ASTELIN Place 2 sprays into both nostrils 2 (two) times  daily. Use in each nostril as directed   beclomethasone 40 MCG/ACT inhaler Commonly known as: QVAR Use prn asthma flares 2 puffs twice a day   cetirizine 10 MG tablet Commonly known as: ZYRTEC Take 10 mg by mouth daily as needed.   chlorpheniramine-HYDROcodone 10-8 MG/5ML Suer Commonly known as: Tussionex Pennkinetic ER Take 5 mLs by mouth 2 (two) times daily. Started by:  Larose HiresPatricia , MD   clonazePAM 1 MG tablet Commonly known as: KLONOPIN Take 1 tablet (1 mg total) by mouth 2 (two) times daily.   Coenzyme Q10 200 MG capsule  Take 200 mg by mouth daily.   ezetimibe 10 MG tablet Commonly known as: ZETIA TK 1 T PO D   Fish Oil 1000 MG Caps Take by mouth.   levalbuterol 45 MCG/ACT inhaler Commonly known as: Xopenex HFA Inhale 2 puffs into the lungs every 6 (six) hours as needed for wheezing.   montelukast 10 MG tablet Commonly known as: SINGULAIR TAKE 1 TABLET(10 MG) BY MOUTH DAILY   multivitamin tablet Take 1 tablet by mouth daily.   sertraline 50 MG tablet Commonly known as: ZOLOFT TAKE 1 TABLET(50 MG) BY MOUTH DAILY   Symbicort 160-4.5 MCG/ACT inhaler Generic drug: budesonide-formoterol INHALE 2 PUFFS INTO THE LUNGS TWICE DAILY   Tart Cherry Advanced Caps Take 1 capsule by mouth daily.   TURMERIC PO Take 1 capsule by mouth 2 (two) times daily.   Vitamin D 50 MCG (2000 UT) tablet Take 2,000 Units by mouth daily.       Known medication allergies: Allergies  Allergen Reactions  . Statins     Myalgias on simvastatin 20mg  daily, rosuvastatin 5mg  and 20mg  daily, atorvastatin 10mg  daily, and pitavastatin 1mg  daily  . Fluticasone-Salmeterol Hives    REACTION: hives REACTION: hives  . Scallops [Shellfish Allergy] Hives     Physical examination: Blood pressure 138/76, pulse 68, temperature 97.6 F (36.4 C), temperature source Temporal, resp. rate 16, height 5\' 5"  (1.651 m), weight 191 lb (86.6 kg), SpO2 98 %.  General: Alert, interactive, in no acute distress. HEENT: PERRLA, TMs pearly gray, turbinates mildly edematous with clear discharge, post-pharynx non erythematous. Neck: Supple without lymphadenopathy. Lungs: Mildly decreased breath sounds bilaterally without wheezing, rhonchi or rales. {no increased work of breathing. CV: Normal S1, S2 without murmurs. Abdomen: Nondistended, nontender. Skin: Warm and dry, without lesions or rashes. Extremities:  No clubbing, cyanosis or edema. Neuro:   Grossly intact.  Diagnositics/Labs:  Spirometry: FEV1: 1.76L 66%, FVC: 2.35L 68%, ratio  consistent with restrictive pattern however this is an improvement from previous study   Assessment and plan: Moderate persistent asthma -currently under good control with use of her Symbicort and montelukast.  Spirometry is improved today which is great.  She has decreased albuterol needs at this time.  I however have provided her with a prednisone pack in case she does flare as well as Tussionex prescription to help with cough in hopes that this will prevent her from needing to go to the emergency department.  However she is aware of signs and symptoms where she would need to go to an urgent care or the ED if symptoms occur. Allergic rhinitis -believe that her hoarseness is related to postnasal drainage and have advised her to start use of her Astelin. LPRD -continues to be asymptomatic    1. Continue Symbicort 160mg  2 puffs once a day.  Advised to increase to twice a day if she develops respiratory symptoms in the evenings or overnight, if she  is not meeting the below goals or if her average peak flow decreases below 350 L.   Control goals:   Full participation in all desired activities (may need albuterol before activity)  Albuterol use two time or less a week on average (not counting use with activity)  Cough interfering with sleep two time or less a month  Oral steroids no more than once a year  No hospitalizations  2.  Use Xopenex 2 puffs every 4-6 hours as needed for cough/wheeze/shortness of breath/chest tightness.   May use 15-20 minutes prior to exercise/activity if needed.  Monitor frequency of use.     3.  During asthma flares/respiratory illnesses add in Qvar 74mcg 2 puffs twice a day with your maintenance Symbicort.  Once symptoms have resolved can stop the Qvar.    4. Continue Montelukast 10mg  daily  5. For nasal drainage use nasal antihistamine, Astelin 2 sprays each nostril 1-2 times a day.  Use with proper nasal spray technique (point tip of spray toward ear of same  side nostril).   For nasal congestion use nasal steroid spray like Nasocort or Flonase 2 sprays each nostril daily if needed (use for 1-2 weeks at a time before stopping once symptoms improve).    6. Provided with prednisone pack to use only if having asthma flare.   Tussionex also refilled at this time for use if having any uncontrolled cough  Return to clinic in 3-4 months (Nov 2020) or sooner if needed   I appreciate the opportunity to take part in Yovana's care. Please do not hesitate to contact me with questions.  Sincerely,   Prudy Feeler, MD Allergy/Immunology [facility]

## 2019-09-25 ENCOUNTER — Encounter: Payer: Self-pay | Admitting: Gynecology

## 2019-10-09 ENCOUNTER — Telehealth: Payer: Self-pay | Admitting: Internal Medicine

## 2019-10-09 NOTE — Telephone Encounter (Signed)
LVM to CB and schedule CPE and Labs due after 01/30/2020

## 2019-10-15 ENCOUNTER — Encounter: Payer: BLUE CROSS/BLUE SHIELD | Admitting: Gynecology

## 2019-10-18 NOTE — Telephone Encounter (Signed)
Patient called back and said that she is now going to [place], for the [place] executive physicals, with Dr Noralee Stain, someone has changed her PCP to this doctor. I let her know she had her last physical here on 01/29/19. I ask if she was changing PCP, since it had been change in the system. She was not sure if this Doctor was going to refill medications, so I ask her to check on that and let us know if she wanted to continue to come here as Dr Renold Genta as her PCP, because you can not have 2 PCP

## 2019-10-18 NOTE — Telephone Encounter (Signed)
Thanks, I agree

## 2019-10-24 NOTE — Telephone Encounter (Signed)
LVM to see if [facility] Noralee Stain, MD) was going to be her PCP so that I can change iot in our records and have her removed off the [facility] list.

## 2019-10-29 ENCOUNTER — Other Ambulatory Visit: Payer: Self-pay

## 2019-10-29 NOTE — Telephone Encounter (Signed)
Renee called back to say Dr Noralee Stain is now her PCP. Dr Renold Genta has been removed as PCP

## 2019-10-30 ENCOUNTER — Ambulatory Visit (INDEPENDENT_AMBULATORY_CARE_PROVIDER_SITE_OTHER): Payer: 59 | Admitting: Gynecology

## 2019-10-30 ENCOUNTER — Encounter: Payer: Self-pay | Admitting: Gynecology

## 2019-10-30 VITALS — BP 124/80 | Ht 65.5 in | Wt 192.0 lb

## 2019-10-30 DIAGNOSIS — Z01419 Encounter for gynecological examination (general) (routine) without abnormal findings: Secondary | ICD-10-CM | POA: Diagnosis not present

## 2019-10-30 DIAGNOSIS — R87610 Atypical squamous cells of undetermined significance on cytologic smear of cervix (ASC-US): Secondary | ICD-10-CM | POA: Diagnosis not present

## 2019-10-30 DIAGNOSIS — N951 Menopausal and female climacteric states: Secondary | ICD-10-CM | POA: Diagnosis not present

## 2019-10-30 MED ORDER — PROGESTERONE MICRONIZED 100 MG PO CAPS: 100.0000 mg | ORAL_CAPSULE | Freq: Every day | ORAL | 4 refills | Status: DC

## 2019-10-30 MED ORDER — ESTRADIOL 0.05 MG/24HR TD PTTW: 1.0000 | MEDICATED_PATCH | TRANSDERMAL | 4 refills | Status: DC

## 2019-10-30 NOTE — Patient Instructions (Signed)
Start on the hormone replacement as we discussed.  Call if you have any issues with this or you do any vaginal bleeding.  Schedule and follow-up for the bone density.  Follow-up in 1 year for annual exam

## 2019-10-30 NOTE — Addendum Note (Signed)
Addended by: Nelva Nay on: 10/30/2019 02:34 PM   Modules accepted: Orders

## 2019-10-30 NOTE — Progress Notes (Signed)
    Paula Bradley 01-22-1961 [id number]        58 y.o.  [id number] for annual gynecologic exam.  Is having hot flushes and sweats.  Also having issues with anxiety and depression.  She does have a history of anxiety and depression in the past.  Is actively being followed by her primary provider for this.  She was wanting to discuss possibly starting HRT to see if it helps with her symptoms.  Past medical history,surgical history, problem list, medications, allergies, family history and social history were all reviewed and documented as reviewed in the EPIC chart.  ROS:  Performed with pertinent positives and negatives included in the history, assessment and plan.   Additional significant findings : None   Exam: Caryn Bee assistant Vitals:   10/30/19 1355  BP: 124/80  Weight: 192 lb (87.1 kg)  Height: 5' 5.5" (1.664 m)   Body mass index is 31.46 kg/m.  General appearance:  Normal affect, orientation and appearance. Skin: Grossly normal HEENT: Without gross lesions.  No cervical or supraclavicular adenopathy. Thyroid normal.  Lungs:  Clear without wheezing, rales or rhonchi Cardiac: RR, without RMG Abdominal:  Soft, nontender, without masses, guarding, rebound, organomegaly or hernia Breasts:  Examined lying and sitting without masses, retractions, discharge or axillary adenopathy. Pelvic:  Ext, BUS, Vagina: Normal with atrophic changes  Cervix: Normal with atrophic changes  Uterus: Anteverted, normal size, shape and contour, midline and mobile nontender   Adnexa: Without masses or tenderness    Anus and perineum: Normal   Rectovaginal: Normal sphincter tone without palpated masses or tenderness.    Assessment/Plan:  58 y.o. [id number] female for annual gynecologic exam.   1. Postmenopausal/menopausal symptoms.  Patient is having hot flushes and sweats.  Also having anxiety and depression.  We discussed the menopause and whether her anxiety and depression is related to hormone  levels versus primary disorder.  She has a history of anxiety and depression before and we discussed that HRT is less likely to improve the symptoms.  Certainly may help with hot flushes and sweats.  Risks to include thrombosis such as stroke heart attack DVT as well as increased risk of breast cancer discussed.  Benefit of transdermal absorption also discussed.  After lengthy discussion the patient would like a trial of HRT.  Vivelle 0.05 mg patch and Prometrium 100 mg nightly prescribed.  10-month supply for both with 1 year refill.  We will see how she does.  She will call if she has any issues once starting.  If she does not notice any significant improvement in her symptoms and she will discontinue.  If she does any bleeding she knows to call. 2. Mammography 05/2019.  Continue with annual mammography when due. 3. DEXA 2012 normal.  Recommend DEXA now she is turning 58 and she will schedule in follow-up for this. 4. Colonoscopy 2011.  Repeat colonoscopy this coming year at 10-year interval. 5. Pap smear 2019 showed ASCUS negative high risk HPV.  No significant abnormal Pap smears previously.  Repeat Pap smear done today. 6. Health maintenance.  No routine lab work done as patient does this elsewhere.  Follow-up 1 year, sooner as needed.   Anastasio Auerbach MD, 2:25 PM 10/30/2019

## 2019-11-02 ENCOUNTER — Encounter: Payer: Self-pay | Admitting: Gynecology

## 2019-11-02 LAB — PAP IG W/ RFLX HPV ASCU

## 2019-11-02 LAB — HUMAN PAPILLOMAVIRUS, HIGH RISK: HPV DNA High Risk: NOT DETECTED

## 2019-11-05 ENCOUNTER — Encounter: Payer: Self-pay | Admitting: *Deleted

## 2019-11-09 ENCOUNTER — Other Ambulatory Visit: Payer: Self-pay

## 2019-11-12 ENCOUNTER — Encounter: Payer: Self-pay | Admitting: Gynecology

## 2019-11-12 ENCOUNTER — Other Ambulatory Visit: Payer: Self-pay

## 2019-11-12 ENCOUNTER — Ambulatory Visit (INDEPENDENT_AMBULATORY_CARE_PROVIDER_SITE_OTHER): Payer: 59 | Admitting: Gynecology

## 2019-11-12 VITALS — BP 124/80

## 2019-11-12 DIAGNOSIS — N870 Mild cervical dysplasia: Secondary | ICD-10-CM | POA: Diagnosis not present

## 2019-11-12 DIAGNOSIS — R87610 Atypical squamous cells of undetermined significance on cytologic smear of cervix (ASC-US): Secondary | ICD-10-CM

## 2019-11-12 NOTE — Progress Notes (Signed)
    Paula Bradley 07/10/1961 [id number]        58 y.o.  [id number] presents for colposcopy.  Pap smear last year showed ASCUS negative high risk HPV.  Repeat Pap smear this year showed ASCUS negative high risk HPV.  Past medical history,surgical history, problem list, medications, allergies, family history and social history were all reviewed and documented in the EPIC chart.  Directed ROS with pertinent positives and negatives documented in the history of present illness/assessment and plan.  Exam: Caryn Bee assistant Vitals:   11/12/19 1128  BP: 124/80   General appearance:  Normal Abdomen soft nontender without mass guarding rebound Pelvic external BUS vagina with atrophic changes.  Cervix with atrophic changes.  Uterus normal size midline mobile nontender.  Adnexa without masses or tenderness.  Colposcopy performed after acetic acid cleanse was inadequate with no transformation zone visualized.  No abnormalities visualized.  ECC performed after gentle external os dilatation with disposable dilator.  Patient tolerated well.  Assessment/Plan:  58 y.o. [id number] with persistent ASCUS negative high risk HPV.  Colposcopy was normal although inadequate.  ECC performed.  Patient will follow-up for results.  We discussed dysplasia, high-grade/low-grade, progression/regression.  If ECC negative or low-grade then plan expectant management with follow-up Pap smear 1 year.  If otherwise then will triage based upon results.    Anastasio Auerbach MD, 11:45 AM 11/12/2019

## 2019-11-12 NOTE — Patient Instructions (Signed)
Office will call you with biopsy results 

## 2019-11-14 ENCOUNTER — Encounter: Payer: Self-pay | Admitting: Gynecology

## 2019-11-14 LAB — TISSUE SPECIMEN

## 2019-11-14 LAB — PATHOLOGY REPORT

## 2019-11-21 ENCOUNTER — Other Ambulatory Visit: Payer: Self-pay

## 2019-11-21 MED ORDER — BUDESONIDE-FORMOTEROL FUMARATE 160-4.5 MCG/ACT IN AERO: 2.0000 | INHALATION_SPRAY | Freq: Two times a day (BID) | RESPIRATORY_TRACT | 5 refills | Status: DC

## 2019-11-26 MED ORDER — BUDESONIDE-FORMOTEROL FUMARATE 160-4.5 MCG/ACT IN AERO: 2.0000 | INHALATION_SPRAY | Freq: Two times a day (BID) | RESPIRATORY_TRACT | 5 refills | Status: DC

## 2019-11-26 NOTE — Addendum Note (Signed)
Addended by: Valere Dross on: 11/26/2019 09:49 AM   Modules accepted: Orders

## 2019-12-05 ENCOUNTER — Encounter: Payer: Self-pay | Admitting: Allergy

## 2019-12-05 ENCOUNTER — Ambulatory Visit (INDEPENDENT_AMBULATORY_CARE_PROVIDER_SITE_OTHER): Payer: 59 | Admitting: Allergy

## 2019-12-05 ENCOUNTER — Other Ambulatory Visit: Payer: Self-pay

## 2019-12-05 VITALS — BP 118/78 | HR 74 | Temp 97.2°F | Resp 16

## 2019-12-05 DIAGNOSIS — K219 Gastro-esophageal reflux disease without esophagitis: Secondary | ICD-10-CM | POA: Diagnosis not present

## 2019-12-05 DIAGNOSIS — J4540 Moderate persistent asthma, uncomplicated: Secondary | ICD-10-CM

## 2019-12-05 DIAGNOSIS — J3089 Other allergic rhinitis: Secondary | ICD-10-CM

## 2019-12-05 NOTE — Progress Notes (Signed)
Follow-up Note  RE: Paula Bradley MRN: [id number] DOB: 1961/02/21 Date of Office Visit: 12/05/2019   History of present illness: Paula Bradley is a 58 y.o. female presenting today for follow-up of asthma, allergic rhinitis and LPRD.  She was last seen in the office on 09/05/2019 by myself.  She states she traveled to the [facility] on December 1-5 and while there she did have increase in cough and she did take her prednisone to use the Tussionex with relief of symptoms.  She also reports using her Xopenex as well.  She did increase her Symbicort to twice a day dosing and continues on twice a day dosing at this time.  Prior to this she was using her Symbicort just once a day for maintenance of symptoms.  She denies any nighttime awakenings.  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 She states she saw her PCP who was concerned about her degree of hoarseness and had her evaluated from ENT who did a scope and per patient noted that she had thrush on her vocal cords.  She was treated with an antifungal and that resolved her hoarseness.  She does report gargling after using her inhalers. She denies any nasal congestion or drainage symptoms and has not needed to use her Astelin or any nasal steroid sprays.  Review of systems: Review of Systems  Constitutional: Negative for chills, fever and malaise/fatigue.  HENT: Negative for congestion, ear discharge, nosebleeds, sinus pain and sore throat.   Eyes: Negative for pain, discharge and redness.  Respiratory: Positive for cough and shortness of breath. Negative for sputum production and wheezing.   Cardiovascular: Negative for chest pain and palpitations.  Gastrointestinal: Negative.   Musculoskeletal: Negative.   Skin: Negative for itching and rash.  Neurological: Negative.     All other systems negative unless noted above in HPI  Past medical/social/surgical/family history have been reviewed  and are unchanged unless specifically indicated below.  No changes  Medication List: Current Outpatient Medications  Medication Sig Dispense Refill  . azelastine (ASTELIN) 0.1 % nasal spray Place 2 sprays into both nostrils 2 (two) times daily. Use in each nostril as directed 30 mL 5  . beclomethasone (QVAR) 40 MCG/ACT inhaler Use prn asthma flares 2 puffs twice a day    . budesonide-formoterol (SYMBICORT) 160-4.5 MCG/ACT inhaler Inhale 2 puffs into the lungs 2 (two) times daily. 10.2 g 5  . cetirizine (ZYRTEC) 10 MG tablet Take 10 mg by mouth daily as needed.     . Cholecalciferol (VITAMIN D) 2000 UNITS tablet Take 2,000 Units by mouth daily.      . clonazePAM (KLONOPIN) 1 MG tablet Take 1 tablet (1 mg total) by mouth 2 (two) times daily. 60 tablet 2  . Coenzyme Q10 200 MG capsule Take 200 mg by mouth daily.     . DULoxetine (CYMBALTA) 20 MG capsule Take 20 mg by mouth daily.    Marland Kitchen levalbuterol (XOPENEX HFA) 45 MCG/ACT inhaler Inhale 2 puffs into the lungs every 6 (six) hours as needed for wheezing. 15 g 0  . Misc Natural Products (TART CHERRY ADVANCED) CAPS Take 1 capsule by mouth daily.    . Multiple Vitamin (MULTIVITAMIN) tablet Take 1 tablet by mouth daily.    . Omega-3 Fatty Acids (FISH OIL) 1000 MG CAPS Take by mouth.    . TURMERIC PO Take 1 capsule by mouth 2 (two) times daily.      No  current facility-administered medications for this visit.      Known medication allergies: Allergies  Allergen Reactions  . Statins     Myalgias on simvastatin 20mg  daily, rosuvastatin 5mg  and 20mg  daily, atorvastatin 10mg  daily, and pitavastatin 1mg  daily  . Fluticasone-Salmeterol Hives    REACTION: hives REACTION: hives  . Scallops [Shellfish Allergy] Hives     Physical examination: Blood pressure 118/78, pulse 74, temperature (!) 97.2 F (36.2 C), temperature source Temporal, resp. rate 16, SpO2 97 %.  General: Alert, interactive, in no acute distress. HEENT: PERRLA, TMs pearly gray,  turbinates non-edematous without discharge, post-pharynx non erythematous. Neck: Supple without lymphadenopathy. Lungs: Clear to auscultation without wheezing, rhonchi or rales. {no increased work of breathing. CV: Normal S1, S2 without murmurs. Abdomen: Nondistended, nontender. Skin: Warm and dry, without lesions or rashes. Extremities:  No clubbing, cyanosis or edema. Neuro:   Grossly intact.  Diagnositics/Labs:  Spirometry: FEV1: 1.7L 64%, FVC: 1.97L 58%, ratio consistent with restrictive pattern  Assessment and plan: Moderate persistent asthma - she had increased symptoms while [place] the beach recently and did use prednisone and tussionex that helped resolve symptoms.   She has been asymptomatic since her return.  She has plans to travel to [place] early 2021.  She also notes having thrush noted on vocal cords and was treated with resolution of hoareness.   This is always a concern with inhaled steroid use and gargling helps prevent oral thrush however may not prevent thrush of the vocal cords or lower airway.  She did respond to therapy.  Will try to decrease amount inhaled steroid she needs to maintain good control of her asthma. Allergic rhinitis - currently denies symptoms and has access to nasal sprays as below LPRD -continues to be asymptomatic  1. Continue Symbicort 160mg  2 puffs once a day (may use additional dose if needed).    Control goals:   Full participation in all desired activities (may need albuterol before activity)  Albuterol use two time or less a week on average (not counting use with activity)  Cough interfering with sleep two time or less a month  Oral steroids no more than once a year  No hospitalizations  2.  Use Xopenex 2 puffs every 4-6 hours as needed for cough/wheeze/shortness of breath/chest tightness.   May use 15-20 minutes prior to exercise/activity if needed.  Monitor frequency of use.     3.  During asthma flares/respiratory illnesses if  Symbicort increased to twice a day is not improving symptoms then add in Qvar 29mcg 2 puffs twice a day with your Symbicort.  Once symptoms have resolved can stop the Qvar.    4. For nasal drainage use nasal antihistamine, Astelin 2 sprays each nostril 1-2 times a day.  Use with proper nasal spray technique (point tip of spray toward ear of same side nostril).   For nasal congestion use nasal steroid spray like Nasocort or Flonase 2 sprays each nostril daily if needed (use for 1-2 weeks at a time before stopping once symptoms improve).    6. Provided with prednisone pack to use only if having asthma flare while traveling.   Use Tussionex as needed if having any uncontrolled cough  Return to clinic in 4 months or sooner if needed  I appreciate the opportunity to take part in Paula Bradley's care. Please do not hesitate to contact me with questions.  Sincerely,   Prudy Feeler, MD Allergy/Immunology [facility]

## 2019-12-05 NOTE — Patient Instructions (Addendum)
  1. Continue Symbicort 160mg  2 puffs once a day (may use additional dose if needed).    Control goals:   Full participation in all desired activities (may need albuterol before activity)  Albuterol use two time or less a week on average (not counting use with activity)  Cough interfering with sleep two time or less a month  Oral steroids no more than once a year  No hospitalizations  2.  Use Xopenex 2 puffs every 4-6 hours as needed for cough/wheeze/shortness of breath/chest tightness.   May use 15-20 minutes prior to exercise/activity if needed.  Monitor frequency of use.     3.  During asthma flares/respiratory illnesses if Symbicort increased to twice a day is not improving symptoms then add in Qvar 97mcg 2 puffs twice a day with your Symbicort.  Once symptoms have resolved can stop the Qvar.    4. For nasal drainage use nasal antihistamine, Astelin 2 sprays each nostril 1-2 times a day.  Use with proper nasal spray technique (point tip of spray toward ear of same side nostril).   For nasal congestion use nasal steroid spray like Nasocort or Flonase 2 sprays each nostril daily if needed (use for 1-2 weeks at a time before stopping once symptoms improve).    6. Provided with prednisone pack to use only if having asthma flare while traveling.   Use Tussionex as needed if having any uncontrolled cough  Return to clinic in 4 months or sooner if needed

## 2019-12-08 IMAGING — CR DG CHEST 2V
2 series · 2 of 2 positions shown · non-contrast
Comparison: Radiograph 3 days prior 01/01/2018

CLINICAL DATA: Cough for 10 days.

EXAM:
CHEST  2 VIEW

[chest pa]
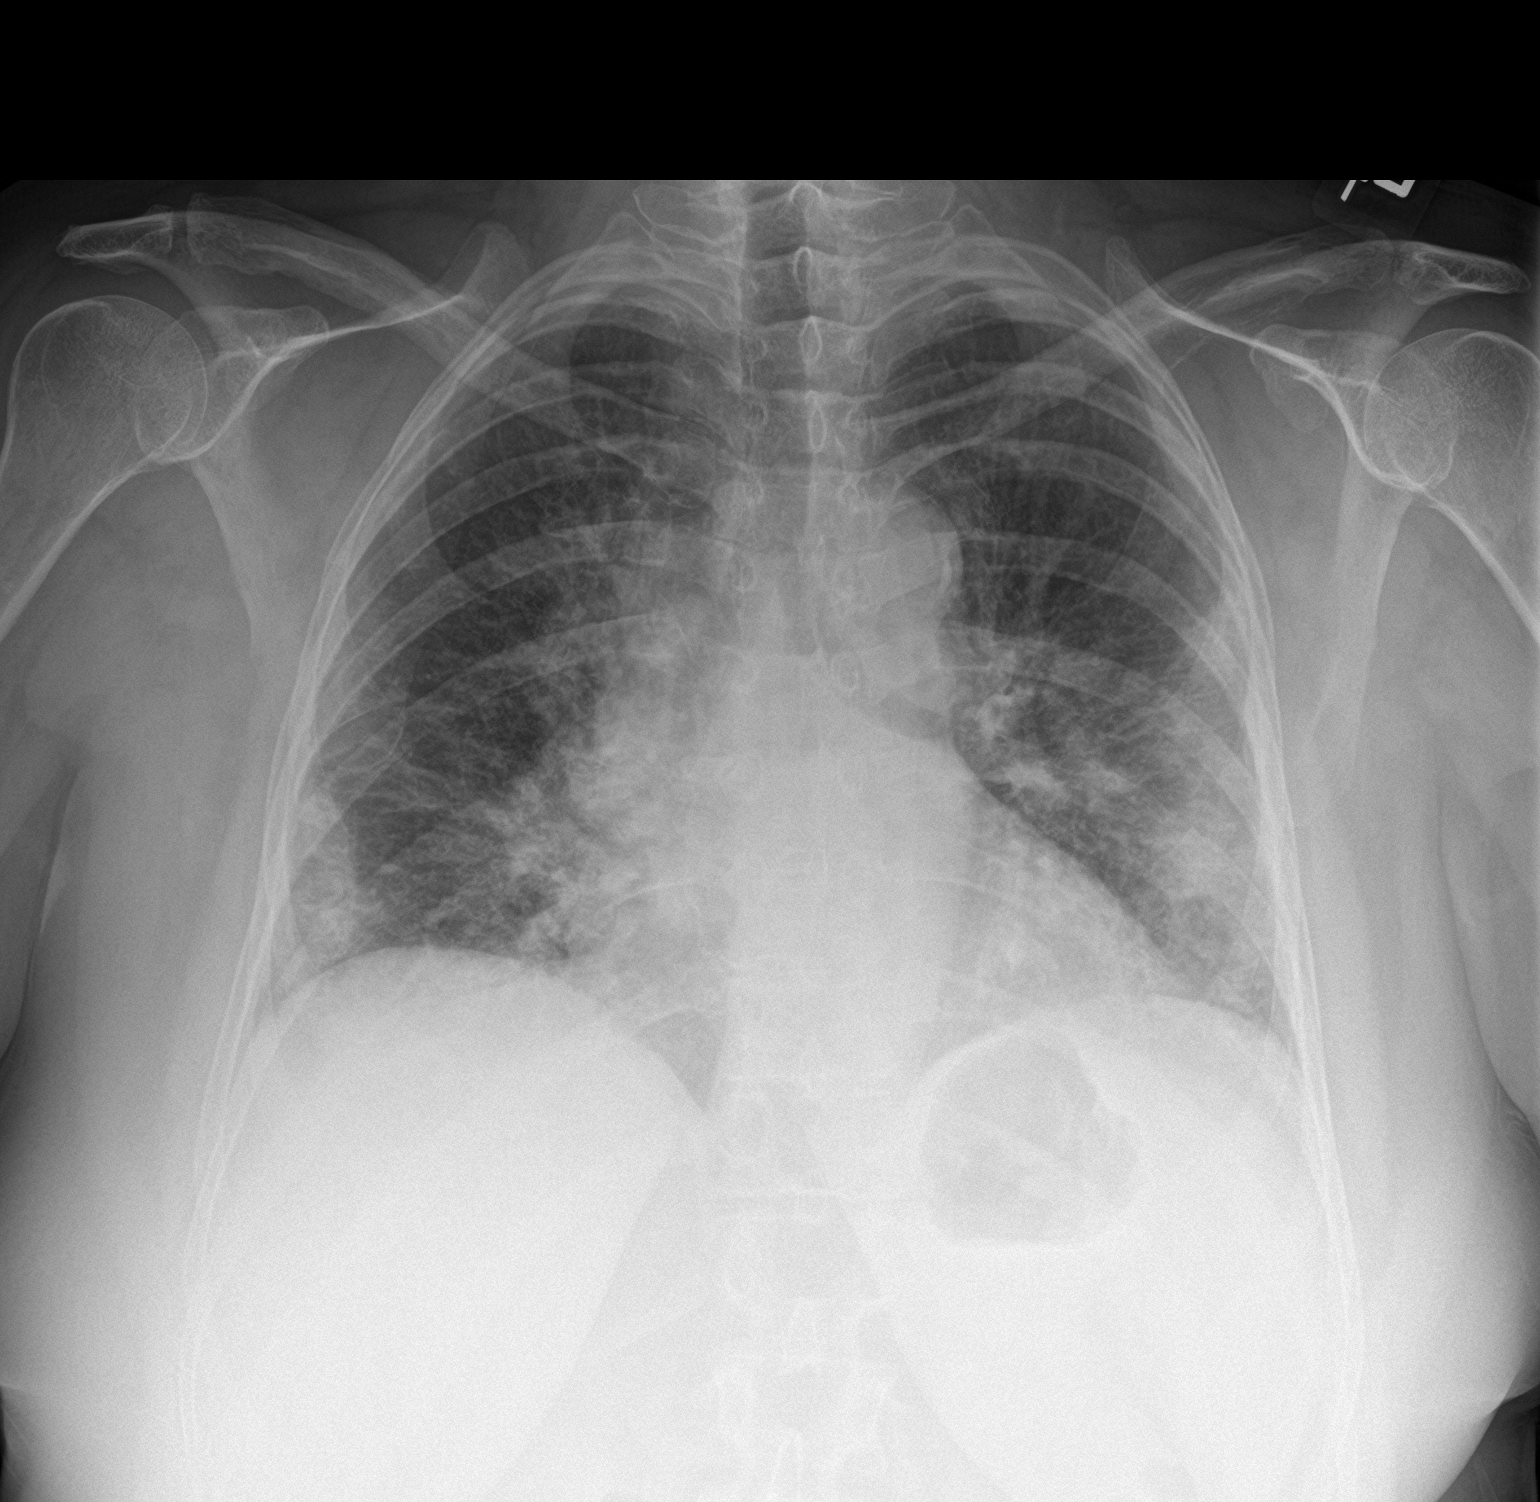

[chest lat]
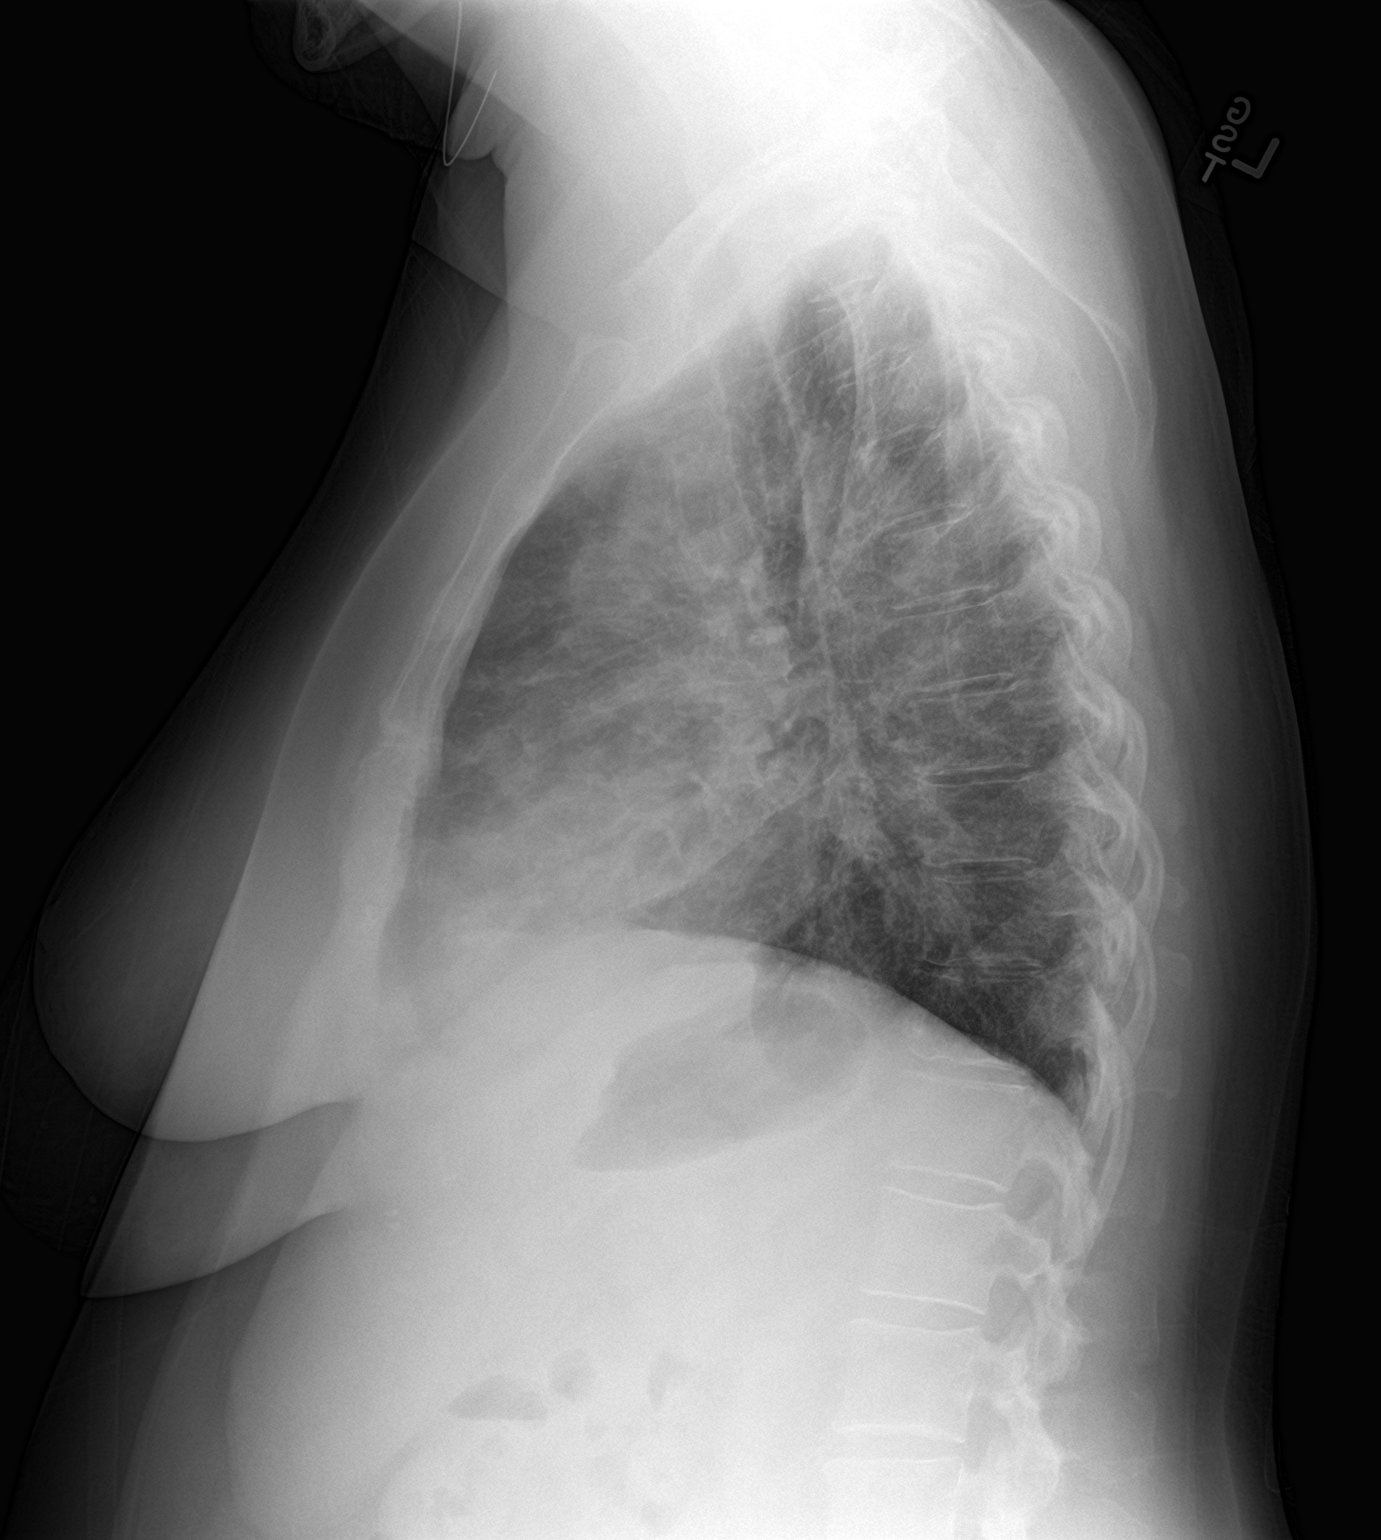

[2 of 2 positions shown; findings below may reference images not displayed]

FINDINGS: Lower lung volumes from prior exam. Slight increased heart size
since prior exam. Development of bilateral perihilar and peripheral
mid-lower lung zone opacities since prior exam, localizing more to
the lingula and middle lobe on the lateral view. Increased bronchial
thickening. No definite pleural effusion. No pneumothorax.
IMPRESSION: Development of bilateral perihilar and peripheral lung opacities
over the past 3 days. Findings suspicious for multifocal pneumonia,
distribution is atypical for pulmonary edema.

Lower lung volumes. Slight increased cardiac size likely secondary
to lower lung volumes.

## 2019-12-18 ENCOUNTER — Ambulatory Visit: Payer: 59 | Admitting: Psychology

## 2020-01-06 ENCOUNTER — Other Ambulatory Visit: Payer: Self-pay | Admitting: Allergy

## 2020-01-06 NOTE — Progress Notes (Signed)
Changed to video visit due to positive COVID test.

## 2020-01-06 NOTE — Progress Notes (Signed)
Called pt by.  She just came back from a ski trip where their cabin had wood burning fireplace that they used.  She believes this has set off an asthma flare.  She has no fever or other covid-like symptoms.  Her only complaint thus far is a chest tightness/heaviness.  She did increase her symbicort to 2 puffs twice a day and started to take the emergency prednisone pack that she has.  I advised at this time to go ahead and add-in the Qvar midday for additional inhaler steroid to lungs.  She requested a sick-day visit.  Will add her at 10:30 for Dr. Elmyra Ricks same day sick visit slot.

## 2020-01-06 NOTE — Progress Notes (Signed)
Recommendations for at home Covid symptoms management:   Please continue isolation at home.  If have acute worsening of symptoms please go to ER/urgent care for further evaluation.  Check pulse oximetry and if below 90-92% please go to ER.  The following supplements may help:  ? Vitamin C 500mg  twice a day and Quercetin 250-500 mg twice a day ? Vitamin D3 2000 - 4000 u/day ? B Complex vitamins ? Zinc 75-100 mg/day ? Melatonin 6-10 mg at night (the optimal dose is unknown) ? Aspirin 81mg /day (no history of bleeding issues)   Continue your asthma medications as discussed.    Will keep appt for tomorrow at 10:30 and will change into a televisit.  The office will call you for the appt.

## 2020-01-06 NOTE — Progress Notes (Signed)
I have scheduled her in the work-in spot at 10:30 on Dr. Elmyra Ricks Monday schedule.

## 2020-01-07 ENCOUNTER — Other Ambulatory Visit: Payer: Self-pay | Admitting: Critical Care Medicine

## 2020-01-07 ENCOUNTER — Other Ambulatory Visit: Payer: Self-pay

## 2020-01-07 ENCOUNTER — Telehealth (INDEPENDENT_AMBULATORY_CARE_PROVIDER_SITE_OTHER): Payer: 59 | Admitting: Allergy

## 2020-01-07 ENCOUNTER — Encounter: Payer: Self-pay | Admitting: Allergy

## 2020-01-07 ENCOUNTER — Telehealth: Payer: Self-pay | Admitting: Critical Care Medicine

## 2020-01-07 ENCOUNTER — Other Ambulatory Visit: Payer: Self-pay | Admitting: Nurse Practitioner

## 2020-01-07 DIAGNOSIS — U071 COVID-19: Secondary | ICD-10-CM

## 2020-01-07 DIAGNOSIS — J4541 Moderate persistent asthma with (acute) exacerbation: Secondary | ICD-10-CM

## 2020-01-07 MED ORDER — BUDESONIDE-FORMOTEROL FUMARATE 160-4.5 MCG/ACT IN AERO: 2.0000 | INHALATION_SPRAY | Freq: Two times a day (BID) | RESPIRATORY_TRACT | 5 refills | Status: DC

## 2020-01-07 MED ORDER — LEVALBUTEROL TARTRATE 45 MCG/ACT IN AERO: 2.0000 | INHALATION_SPRAY | RESPIRATORY_TRACT | 3 refills | Status: DC | PRN

## 2020-01-07 MED ORDER — PREDNISONE 10 MG PO TABS: ORAL_TABLET | ORAL | 0 refills | Status: DC

## 2020-01-07 MED ORDER — BECLOMETHASONE DIPROP HFA 40 MCG/ACT IN AERB: INHALATION_SPRAY | RESPIRATORY_TRACT | 5 refills | Status: DC

## 2020-01-07 NOTE — Telephone Encounter (Signed)
I connected with Paula Bradley who is Covid positive from today the 11th.  The patient's been ill since Wednesday, 6 January.  She has severe persistent asthma.  She is having fever cough shortness of breath muscle aches and headaches.  This patient is high risk as she has severe persistent steroid-dependent asthma and is over 61 years of age  Called to discuss with patient about Covid symptoms and the use of bamlanivimab, a monoclonal antibody infusion for those with mild to moderate Covid symptoms and at a high risk of hospitalization.  Pt is qualified for this infusion at the [facility] infusion center due to Asthma   She has been scheduled for January 14 8:30 AM for monoclonal antibody infusion and is on the cancellation list if she can be moved up

## 2020-01-07 NOTE — Assessment & Plan Note (Addendum)
   Call the hotline for the monoclonal antibody infusion and see if you qualify. [phone].   Patient declines starting on aspirin as it upsets her stomach. No history of bleeding or clotting issues.  1. Please continue isolation at home 1. Please have someone pick up your medication from your pharmacy and drop off at your home other than yourself or your husband.  2. For most persons with COVID-19 illness, isolation and precautions can generally be discontinued 10 days after symptom onset and resolution of fever for at least 24 hours, without the use of fever-reducing medications, and with improvement of other symptoms. 2. If have acute worsening of symptoms please go to ER/urgent care for further evaluation. 1. Including sudden shortness of breath, swelling in the legs, pain in the calf area etc.  3. Check pulse oximetry and if below 90-92% please go to ER. 4. The following supplements may help:  ? Vitamin C 500mg  twice a day ? Vitamin D3 2000 - 4000 u/day ? B Complex vitamins ? Zinc 75-100 mg/day

## 2020-01-07 NOTE — Patient Instructions (Addendum)
Asthma flare with COVID-19 . For the next 5-7 days - use Xopenex 2 puffs prior to using your other inhalers.  . Start prednisone taper.  . Daily controller medication(s): start Symbicort 160 2 puffs twice a day with spacer and rinse mouth afterwards. o Start Qvar 40 2 puffs twice a day and rinse mouth afterwards. . Prior to physical activity: May use albuterol rescue inhaler 2 puffs 5 to 15 minutes prior to strenuous physical activities. Marland Kitchen Rescue medications: May use albuterol rescue inhaler 2 puffs or nebulizer every 4 to 6 hours as needed for shortness of breath, chest tightness, coughing, and wheezing. Monitor frequency of use.  . Asthma control goals:  o Full participation in all desired activities (may need albuterol before activity) o Albuterol use two times or less a week on average (not counting use with activity) o Cough interfering with sleep two times or less a month o Oral steroids no more than once a year o No hospitalizations  Call the hotline for the monoclonal antibody infusion and see if you qualify. [phone].   1. Please continue isolation at home 1. Please have someone pick up your medication from your pharmacy and drop off at your home other than yourself or your husband.  2. For most persons with COVID-19 illness, isolation and precautions can generally be discontinued 10 days after symptom onset and resolution of fever for at least 24 hours, without the use of fever-reducing medications, and with improvement of other symptoms. 2. If have acute worsening of symptoms please go to ER/urgent care for further evaluation. 1. Including sudden shortness of breath, swelling in the legs, pain in the calf area etc.  3. Check pulse oximetry and if below 90-92% please go to ER. 4. The following supplements may help:  ? Vitamin C 500mg  twice a day ? Vitamin D3 2000 - 4000 u/day ? B Complex vitamins ? Zinc 75-100 mg/day  Follow up in 3 months with Dr. for follow  up.  Sincerely,  Paula Lek, DO Allergy & Immunology  [facility] office: [phone] [facility] office: [phone] [place] office: [phone]

## 2020-01-07 NOTE — Progress Notes (Signed)
  I connected by phone with [name] on 01/07/2020 at 5:13 PM to discuss the potential use of an new treatment for mild to moderate COVID-19 viral infection in non-hospitalized patients.  This patient is a 59 y.o. female that meets the FDA criteria for Emergency Use Authorization of bamlanivimab or casirivimab\imdevimab.  Has a (+) direct SARS-CoV-2 viral test result  Has mild or moderate COVID-19   Is ? 59 years of age and weighs ? 40 kg  Is NOT hospitalized due to COVID-19  Is NOT requiring oxygen therapy or requiring an increase in baseline oxygen flow rate due to COVID-19  Is within 10 days of symptom onset  Has at least one of the high risk factor(s) for progression to severe COVID-19 and/or hospitalization as defined in EUA.  Specific high risk criteria : COPD>>jSevere persistent asthma steroid dependent   I have spoken and communicated the following to the patient or parent/caregiver:  1. FDA has authorized the emergency use of bamlanivimab and casirivimab\imdevimab for the treatment of mild to moderate COVID-19 in adults and pediatric patients with positive results of direct SARS-CoV-2 viral testing who are 26 years of age and older weighing at least 40 kg, and who are at high risk for progressing to severe COVID-19 and/or hospitalization.  2. The significant known and potential risks and benefits of bamlanivimab and casirivimab\imdevimab, and the extent to which such potential risks and benefits are unknown.  3. Information on available alternative treatments and the risks and benefits of those alternatives, including clinical trials.  4. Patients treated with bamlanivimab and casirivimab\imdevimab should continue to self-isolate and use infection control measures (e.g., wear mask, isolate, social distance, avoid sharing personal items, clean and disinfect "high touch" surfaces, and frequent handwashing) according to CDC guidelines.   5. The patient or  parent/caregiver has the option to accept or refuse bamlanivimab or casirivimab\imdevimab .  After reviewing this information with the patient, The patient agreed to proceed with receiving the bamlanimivab infusion and will be provided a copy of the Fact sheet prior to receiving the infusion.[name] 01/07/2020 5:13 PM

## 2020-01-07 NOTE — Assessment & Plan Note (Signed)
.   For the next 5-7 days - use Xopenex 2 puffs prior to using your other inhalers.  . Start prednisone taper.  . Daily controller medication(s): start Symbicort 160 2 puffs twice a day with spacer and rinse mouth afterwards. o Start Qvar 40 2 puffs twice a day and rinse mouth afterwards. . Prior to physical activity: May use albuterol rescue inhaler 2 puffs 5 to 15 minutes prior to strenuous physical activities. Marland Kitchen Rescue medications: May use albuterol rescue inhaler 2 puffs or nebulizer every 4 to 6 hours as needed for shortness of breath, chest tightness, coughing, and wheezing. Monitor frequency of use.

## 2020-01-07 NOTE — Progress Notes (Signed)
RE: Paula Bradley MRN: [id number] DOB: Mar 16, 1961 Date of Telemedicine Visit: 01/07/2020  Referring provider: Windy Canny, MD Primary care provider: Windy Canny, MD  Chief Complaint: Cough (Covid Positive)   Telemedicine Follow Up Visit via Telephone: I connected with Paula Bradley for a follow up on 01/07/20 by telephone and verified that I am speaking with the correct person using two identifiers.   I discussed the limitations, risks, security and privacy concerns of performing an evaluation and management service by telephone and the availability of in person appointments. I also discussed with the patient that there may be a patient responsible charge related to this service. The patient expressed understanding and agreed to proceed.  Patient is at home/work accompanied by her husband who provided/contributed to the history.  Provider is at the office.  Visit start time: 10:40AM Visit end time: 11:15AM Insurance consent/check in by: front desk Medical consent and medical assistant/nurse: Paula F.  History of Present Illness: She is a 59 y.o. female, who is being followed for asthma, allergic rhinitis and LPRD. Her previous allergy office visit was on 12/05/2019 with Dr. Delorse Lek. Today is a new complaint visit of covid-19 symptoms and worsening respiratory symptoms.  Patient was diagnosed with COVID-19 yesterday and had testing done due to positive symptoms. Currently experiencing coughing, fatigue, fevers, asthma flaring up for the past 6 days and it's worsening.  Patient is taking mucinex and Tussionex with no benefit. She also took prednisone 10mg  tablets for a total 13 pills which she had at home with no benefit.   Tmax of 100.6 and taking tylenol prn. Pulse ox at home reading 96-99%.  Patient's husband tested positive as well but doing better than her.  Currently on Symbicort 160 2 puffs BID, Qvar 40 2 puffs BID (but expired), and xopenex prn.  She  can't take aspirin as it upsets her stomach.  No history of clotting or bleeding issues.   Assessment and Plan: Paula Bradley is a 59 y.o. female with: Asthma, not well controlled, moderate persistent, with acute exacerbation . For the next 5-7 days - use Xopenex 2 puffs prior to using your other inhalers.  . Start prednisone taper.  . Daily controller medication(s): start Symbicort 160 2 puffs twice a day with spacer and rinse mouth afterwards. o Start Qvar 40 2 puffs twice a day and rinse mouth afterwards. . Prior to physical activity: May use albuterol rescue inhaler 2 puffs 5 to 15 minutes prior to strenuous physical activities. 67 Rescue medications: May use albuterol rescue inhaler 2 puffs or nebulizer every 4 to 6 hours as needed for shortness of breath, chest tightness, coughing, and wheezing. Monitor frequency of use.   COVID-19  Call the hotline for the monoclonal antibody infusion and see if you qualify. [phone].   Patient declines starting on aspirin as it upsets her stomach. No history of bleeding or clotting issues.  1. Please continue isolation at home 1. Please have someone pick up your medication from your pharmacy and drop off at your home other than yourself or your husband.  2. For most persons with COVID-19 illness, isolation and precautions can generally be discontinued 10 days after symptom onset and resolution of fever for at least 24 hours, without the use of fever-reducing medications, and with improvement of other symptoms. 2. If have acute worsening of symptoms please go to ER/urgent care for further evaluation. 1. Including sudden shortness of breath, swelling in the legs, pain in the calf area etc.  3. Check pulse oximetry and if below 90-92% please go to ER. 4. The following supplements may help:  ? Vitamin C 500mg  twice a day ? Vitamin D3 2000 - 4000 u/day ? B Complex vitamins ? Zinc 75-100 mg/day  Return in about 3 months (around 04/06/2020).  Meds ordered  this encounter  Medications  . beclomethasone (QVAR) 40 MCG/ACT inhaler    Sig: Use prn asthma flares 2 puffs twice a day    Dispense:  10.6 g    Refill:  5  . levalbuterol (XOPENEX HFA) 45 MCG/ACT inhaler    Sig: Inhale 2 puffs into the lungs every 4 (four) hours as needed for wheezing or shortness of breath.    Dispense:  15 g    Refill:  3  . budesonide-formoterol (SYMBICORT) 160-4.5 MCG/ACT inhaler    Sig: Inhale 2 puffs into the lungs 2 (two) times daily.    Dispense:  1 Inhaler    Refill:  5  . predniSONE (DELTASONE) 10 MG tablet    Sig: Take prednisone 40mg  daily x 2 days, 30mg  daily x 2 days, 20mg  daily x 2 days and 10mg  daily x 2 days.    Dispense:  20 tablet    Refill:  0   Diagnostics: None.  Medication List:  Current Outpatient Medications  Medication Sig Dispense Refill  . azelastine (ASTELIN) 0.1 % nasal spray Place 2 sprays into both nostrils 2 (two) times daily. Use in each nostril as directed 30 mL 5  . beclomethasone (QVAR) 40 MCG/ACT inhaler Use prn asthma flares 2 puffs twice a day 10.6 g 5  . cetirizine (ZYRTEC) 10 MG tablet Take 10 mg by mouth daily as needed.     . Cholecalciferol (VITAMIN D) 2000 UNITS tablet Take 2,000 Units by mouth daily.      . clonazePAM (KLONOPIN) 1 MG tablet Take 1 tablet (1 mg total) by mouth 2 (two) times daily. 60 tablet 2  . Coenzyme Q10 200 MG capsule Take 200 mg by mouth daily.     . DULoxetine (CYMBALTA) 20 MG capsule Take 20 mg by mouth daily.    Marland Kitchen levalbuterol (XOPENEX HFA) 45 MCG/ACT inhaler Inhale 2 puffs into the lungs every 4 (four) hours as needed for wheezing or shortness of breath. 15 g 3  . Misc Natural Products (TART CHERRY ADVANCED) CAPS Take 1 capsule by mouth daily.    . Multiple Vitamin (MULTIVITAMIN) tablet Take 1 tablet by mouth daily.    . Omega-3 Fatty Acids (FISH OIL) 1000 MG CAPS Take by mouth.    . TURMERIC PO Take 1 capsule by mouth 2 (two) times daily.     . budesonide-formoterol (SYMBICORT) 160-4.5  MCG/ACT inhaler Inhale 2 puffs into the lungs 2 (two) times daily. 1 Inhaler 5  . predniSONE (DELTASONE) 10 MG tablet Take prednisone 40mg  daily x 2 days, 30mg  daily x 2 days, 20mg  daily x 2 days and 10mg  daily x 2 days. 20 tablet 0   No current facility-administered medications for this visit.   Allergies: Allergies  Allergen Reactions  . Statins     Myalgias on simvastatin 20mg  daily, rosuvastatin 5mg  and 20mg  daily, atorvastatin 10mg  daily, and pitavastatin 1mg  daily  . Fluticasone-Salmeterol Hives    REACTION: hives REACTION: hives  . Scallops [Shellfish Allergy] Hives   I reviewed her past medical history, social history, family history, and environmental history and no significant changes have been reported from her previous visit.  Review of Systems  Constitutional: Positive  for fatigue and fever. Negative for appetite change, chills and unexpected weight change.  HENT: Positive for congestion. Negative for rhinorrhea.   Eyes: Negative for itching.  Respiratory: Positive for cough, chest tightness, shortness of breath and wheezing.   Gastrointestinal: Negative for abdominal pain.  Skin: Negative for rash.  Neurological: Negative for headaches.   Objective: Physical Exam Not obtained as encounter was done via telephone.  Patient is coughing at times during the phone conversation.   Previous notes and tests were reviewed.  I discussed the assessment and treatment plan with the patient. The patient was provided an opportunity to ask questions and all were answered. The patient agreed with the plan and demonstrated an understanding of the instructions. After visit summary/patient instructions available via mychart.   The patient was advised to call back or seek an in-person evaluation if the symptoms worsen or if the condition fails to improve as anticipated.  I provided 35 minutes of non-face-to-face time during this encounter.  It was my pleasure to participate in Cicero  Val's care today. Please feel free to contact me with any questions or concerns.   Sincerely,  Wyline Mood, DO Allergy & Immunology  [facility] office: [phone] [facility] office: [phone] [place] office: [phone]

## 2020-01-07 NOTE — Progress Notes (Unsigned)
  I connected by phone with Paula Bradley on 01/07/2020 at 3:38 PM to discuss the potential use of an new treatment for mild to moderate COVID-19 viral infection in non-hospitalized patients.  This patient is a 59 y.o. female that meets the FDA criteria for Emergency Use Authorization of bamlanivimab or casirivimab\imdevimab.  Has a (+) direct SARS-CoV-2 viral test result  Has mild or moderate COVID-19   Is ? 61 years of age and weighs ? 40 kg  Is NOT hospitalized due to COVID-19  Is NOT requiring oxygen therapy or requiring an increase in baseline oxygen flow rate due to COVID-19  Is within 10 days of symptom onset  Has at least one of the high risk factor(s) for progression to severe COVID-19 and/or hospitalization as defined in EUA.  Specific high risk criteria : {high risk factor:23356}   I have spoken and communicated the following to the patient or parent/caregiver:  1. FDA has authorized the emergency use of bamlanivimab and casirivimab\imdevimab for the treatment of mild to moderate COVID-19 in adults and pediatric patients with positive results of direct SARS-CoV-2 viral testing who are 18 years of age and older weighing at least 40 kg, and who are at high risk for progressing to severe COVID-19 and/or hospitalization.  2. The significant known and potential risks and benefits of bamlanivimab and casirivimab\imdevimab, and the extent to which such potential risks and benefits are unknown.  3. Information on available alternative treatments and the risks and benefits of those alternatives, including clinical trials.  4. Patients treated with bamlanivimab and casirivimab\imdevimab should continue to self-isolate and use infection control measures (e.g., wear mask, isolate, social distance, avoid sharing personal items, clean and disinfect "high touch" surfaces, and frequent handwashing) according to CDC guidelines.   5. The patient or parent/caregiver has the option to  accept or refuse bamlanivimab or casirivimab\imdevimab .  After reviewing this information with the patient, {Infusion AGREE/DECLINE:23358}. Ivonne Andrew 01/07/2020 3:38 PM

## 2020-01-08 ENCOUNTER — Telehealth: Payer: Self-pay | Admitting: *Deleted

## 2020-01-08 ENCOUNTER — Other Ambulatory Visit: Payer: Self-pay | Admitting: *Deleted

## 2020-01-08 DIAGNOSIS — J4541 Moderate persistent asthma with (acute) exacerbation: Secondary | ICD-10-CM

## 2020-01-08 MED ORDER — LEVALBUTEROL TARTRATE 45 MCG/ACT IN AERO: 2.0000 | INHALATION_SPRAY | RESPIRATORY_TRACT | 5 refills | Status: DC | PRN

## 2020-01-08 NOTE — Telephone Encounter (Signed)
PA has been submitted for Levalbuterol HFA and is currently pending approval or denial through CoverMyMeds.

## 2020-01-08 NOTE — Telephone Encounter (Signed)
Insurance company called and stated that the inhaler is formulary just need to ensure that the 15mg  inhaler is dispensed.

## 2020-01-09 ENCOUNTER — Telehealth: Payer: Self-pay

## 2020-01-09 NOTE — Telephone Encounter (Signed)
PA started for Qvar 40.  Alternatives include:  Arnuity Ellipta, Pulmicort Flexhaler, Flovent HFA/Diskus Pt has tried and failed Flovent 01/04/2012  [employer] is reviewing your PA request. Typically an electronic response will be received within 72 hours. To check for an update later, open this request from your dashboard.  You may close this dialog and return to your dashboard to perform other tasks.

## 2020-01-10 ENCOUNTER — Telehealth: Payer: Self-pay | Admitting: Allergy

## 2020-01-10 ENCOUNTER — Ambulatory Visit (HOSPITAL_COMMUNITY)
Admission: RE | Admit: 2020-01-10 | Discharge: 2020-01-10 | Disposition: A | Discharge status: Home / Self Care | Payer: 59 | Source: Ambulatory Visit | Attending: Pulmonary Disease | Admitting: Pulmonary Disease

## 2020-01-10 DIAGNOSIS — J4541 Moderate persistent asthma with (acute) exacerbation: Secondary | ICD-10-CM

## 2020-01-10 DIAGNOSIS — Z23 Encounter for immunization: Secondary | ICD-10-CM | POA: Insufficient documentation

## 2020-01-10 DIAGNOSIS — U071 COVID-19: Secondary | ICD-10-CM

## 2020-01-10 MED ORDER — SODIUM CHLORIDE 0.9 % IV SOLN
700.0000 mg | Freq: Once | INTRAVENOUS | Status: AC
  Administered 2020-01-10: 700 mg via INTRAVENOUS
  Filled 2020-01-10: qty 20

## 2020-01-10 MED ORDER — ALBUTEROL SULFATE HFA 108 (90 BASE) MCG/ACT IN AERS: 2.0000 | INHALATION_SPRAY | Freq: Once | RESPIRATORY_TRACT | Status: DC | PRN

## 2020-01-10 MED ORDER — DIPHENHYDRAMINE HCL 50 MG/ML IJ SOLN: 50.0000 mg | Freq: Once | INTRAMUSCULAR | Status: DC | PRN

## 2020-01-10 MED ORDER — LEVALBUTEROL HCL 1.25 MG/3ML IN NEBU: 1.2500 mg | INHALATION_SOLUTION | RESPIRATORY_TRACT | 1 refills | Status: DC | PRN

## 2020-01-10 MED ORDER — METHYLPREDNISOLONE SODIUM SUCC 125 MG IJ SOLR: 125.0000 mg | Freq: Once | INTRAMUSCULAR | Status: DC | PRN

## 2020-01-10 MED ORDER — EPINEPHRINE 0.3 MG/0.3ML IJ SOAJ: 0.3000 mg | Freq: Once | INTRAMUSCULAR | Status: DC | PRN

## 2020-01-10 MED ORDER — FAMOTIDINE IN NACL 20-0.9 MG/50ML-% IV SOLN: 20.0000 mg | Freq: Once | INTRAVENOUS | Status: DC | PRN

## 2020-01-10 MED ORDER — SODIUM CHLORIDE 0.9 % IV SOLN: INTRAVENOUS | Status: DC | PRN

## 2020-01-10 NOTE — Telephone Encounter (Signed)
Patient had gotten COVID and went to get infusion this Am and is gaging when taking her inhaler. Patient is not taking her inhalers properly and is wondering if we can send in rescue inhaler.

## 2020-01-10 NOTE — Progress Notes (Signed)
  Diagnosis: COVID-19  Physician: Dr. Wright  Procedure: Covid Infusion Clinic Med: bamlanivimab infusion - Provided patient with bamlanimivab fact sheet for patients, parents and caregivers prior to infusion.  Complications: No immediate complications noted.  Discharge: Discharged home   Paula Bradley 01/10/2020  

## 2020-01-10 NOTE — Telephone Encounter (Signed)
Patient called at [facility] stating that she is feeling too sick to make her infusion this morning at 8:30AM and wants to know what she can do. She is coughing, vomiting and has high fever. Advised patient to go to ER for further evaluation as she was coughing throughout the conversation.   Patient calls again at 6AM stating that she is feeling much better and did not go to ER. She thinks she had a little panic attack overnight and she is planning on going to get her infusion this morning.

## 2020-01-10 NOTE — Telephone Encounter (Signed)
Qvar approved through [employer] until 01/08/2021 or until coverage for medication is no longer available.

## 2020-01-10 NOTE — Progress Notes (Signed)
  Diagnosis: COVID-19  Physician: Windy Canny, MD  Procedure: Covid Infusion Clinic Med: bamlanivimab infusion - Provided patient with bamlanimivab fact sheet for patients, parents and caregivers prior to infusion.  Complications: No immediate complications noted.  Discharge: Discharged home   Cherlynn Perches 01/10/2020

## 2020-01-10 NOTE — Telephone Encounter (Signed)
Pt called and went to get a antib. Infusion this morning and is having coughing and choking cough. And her inhalers are not helping or she is using them wrong. [facility] [place]. [phone].

## 2020-01-10 NOTE — Discharge Instructions (Signed)

## 2020-01-10 NOTE — Telephone Encounter (Signed)
Called per Dr. Delorse Lek and advised patient to seek medical attention at the hospital if her symptoms do not improve. Patient verbalized understanding.

## 2020-01-10 NOTE — Telephone Encounter (Signed)
Advise patient COVID is a virus and antibiotic will not work and she will need to follow her regimen on Symbicort and Qvar 2 puff BID and Xopenex. Per Dr. Delorse Lek patient may need to go to hospital if she feels she can not get relief.

## 2020-01-16 ENCOUNTER — Other Ambulatory Visit: Payer: Self-pay | Admitting: Allergy

## 2020-01-16 NOTE — Telephone Encounter (Signed)
Please advise and thank you. 

## 2020-01-16 NOTE — Telephone Encounter (Signed)
Reviewed chart. She has already received the COVID antibody infusions. She did receive a prednisone taper earlier in January, but we will send in a longer taper. Please call the patient to confirm her pharmacy.  Please make sure that she is using four puffs of albuterol every 4 hours during the day to stay ahead of the coughing. I would do scheduled albuterol doses for the next 3-5 days at least.   I could provide some an opoid cough medication, but my thumb print device is not working. If she wants this, maybe Dr. Selena Batten can prescribe it.   Malachi Bonds, MD [facility]

## 2020-01-16 NOTE — Telephone Encounter (Signed)
Pt called and said that she is in her 10th day with covid and she is coughing and can not stop and she has took the covid cough syrup and it is not working and she is using all her inhalers and they are not working. She need some thing to help her. She also wanted  To know about a pulm respiratiory clinic. [facility] [place]. [phone]

## 2020-01-16 NOTE — Telephone Encounter (Signed)
Dr. Gallagher please advise.  

## 2020-01-17 ENCOUNTER — Telehealth: Payer: Self-pay | Admitting: Allergy

## 2020-01-17 NOTE — Telephone Encounter (Signed)
Call to patient to make her aware of the recommendations from Dr Dellis Anes.  Unsure of which pharmacy she needs prescription sent to, she has several different ones on file.  Will also send my chart message to ask.

## 2020-01-17 NOTE — Telephone Encounter (Signed)
Pt called and said that she needs a new neb machine because is 59 years old. And would like for it to be called into [place] discount [facility]ail supply[facility]. And also wanted to know why no one called her yesterday. [phone].

## 2020-01-18 MED ORDER — PREDNISONE 10 MG PO TABS: ORAL_TABLET | ORAL | 0 refills | Status: DC

## 2020-01-18 NOTE — Telephone Encounter (Signed)
See documentation from 01/16/2020.

## 2020-01-18 NOTE — Telephone Encounter (Signed)
Call to patient at [phone] and finished call at [phone].  Call to let her know that the prescription for the prednisone was sent to the pharmacy and we will send in the nebulizer to [facility] discount and medical supply.  [phone]. Pt had many questions.  Wanted to know how to use all of her medications, how often.  Should she continue to take her medications, Xopenex neb solution Symbicort, Qvar, Nasacort, Azelastine Nasal spray, nasal saline rinse.  Explained to patient how to use all of her medications, the reasons for the medications, how often she should take them.  Pt agreed to using her medications appropriately.  Pt states that she has this cough that does not want to go away, denies coughing while she is asleep, because she uses her humidifier at night.  Explained to patient that if the humidifier helps at night with her cough, then leave it on during the day as well.  Writer offered her an appointment at the [facility] respiratory clinic, she did not feel that she was that bad and did not need to go at this point, but will call us on Monday to see how she is feeling and will decide whether or not she want to be seen there.  Pt then asked about Mucinex, Dr Delorse Lek said yes that it would be okay to take it but use the DM, and make sure she drinks lots of water with it.  Pt agreed.   Call to [facility] discount supply [phone], fax [phone].  Spoke with patient service representative they do not accept insurance and patient would have to pay cash out of pocket.   Call to patient to let her know that she would have to pay cash and she would be responsible for the payment.  Pt states that she did not want that.  Explained to patient that if her current nebulizer is still working to continue to use it until she is seen in February, and we can give her one here in clinic at her visit.   Pt agreed. Also, patient has an appointment on 01/22/2020 to come into clinic, but when we spoke to Dr Delorse Lek, because  she is still having symptoms and has not been symptom free for 10 days, she cannot come into the clinic, her clinic visit has now turned into a telephone visit. Pt agreed.

## 2020-01-22 ENCOUNTER — Encounter: Payer: Self-pay | Admitting: Allergy & Immunology

## 2020-01-22 ENCOUNTER — Ambulatory Visit (INDEPENDENT_AMBULATORY_CARE_PROVIDER_SITE_OTHER): Payer: 59 | Admitting: Allergy & Immunology

## 2020-01-22 ENCOUNTER — Other Ambulatory Visit: Payer: Self-pay

## 2020-01-22 DIAGNOSIS — J4550 Severe persistent asthma, uncomplicated: Secondary | ICD-10-CM

## 2020-01-22 DIAGNOSIS — K219 Gastro-esophageal reflux disease without esophagitis: Secondary | ICD-10-CM

## 2020-01-22 DIAGNOSIS — J3089 Other allergic rhinitis: Secondary | ICD-10-CM | POA: Diagnosis not present

## 2020-01-22 DIAGNOSIS — J4541 Moderate persistent asthma with (acute) exacerbation: Secondary | ICD-10-CM

## 2020-01-22 MED ORDER — BENRALIZUMAB 30 MG/ML ~~LOC~~ SOSY
30.0000 mg | PREFILLED_SYRINGE | SUBCUTANEOUS | Status: AC
  Administered 2020-01-22: 30 mg via SUBCUTANEOUS

## 2020-01-22 MED ORDER — EPINEPHRINE 0.3 MG/0.3ML IJ SOAJ: 0.3000 mg | INTRAMUSCULAR | 1 refills | Status: DC | PRN

## 2020-01-22 MED ORDER — FLUCONAZOLE 100 MG PO TABS: 100.0000 mg | ORAL_TABLET | Freq: Every day | ORAL | 0 refills | Status: DC

## 2020-01-22 MED ORDER — FLUCONAZOLE 100 MG PO TABS: 100.0000 mg | ORAL_TABLET | Freq: Every day | ORAL | 0 refills | Status: AC

## 2020-01-22 MED ORDER — HYDROCOD POLST-CPM POLST ER 10-8 MG/5ML PO SUER: 5.0000 mL | Freq: Two times a day (BID) | ORAL | 0 refills | Status: DC | PRN

## 2020-01-22 NOTE — Progress Notes (Signed)
RE: Paula Bradley MRN: [id number] DOB: Mar 16, 1961 Date of Telemedicine Visit: 01/22/2020  Referring provider: Windy Canny, MD Primary care provider: Windy Canny, MD  Chief Complaint: Follow-up   Telemedicine Follow Up Visit via Telephone: I connected with Paula Bradley for a follow up on 01/23/20 by telephone and verified that I am speaking with the correct person using two identifiers.   I discussed the limitations, risks, security and privacy concerns of performing an evaluation and management service by at telephone and the availability of in person appointments. I also discussed with the patient that there may be a patient responsible charge related to this service. The patient expressed understanding and agreed to proceed.   Patient is home.   Provider is at the office.  Visit start time: 4:09 PM Visit end time: 4:34 PM Insurance consent/check in by: [facility] Medical consent and medical assistant/nurse: Ashleigh  History of Present Illness:  She is a 59 y.o. female, who is being followed for moderate persistent asthma as well as allergic rhinitis and reflux. Her previous allergy office visit was in December 2020 with Dr. Delorse Lek.  She was last seen in December of 02/15/2019.  At that time, she had recently experienced an exacerbation after traveling to the beach.  This exacerbation was treated with a course of prednisone.  She was continued on Symbicort 2 puffs twice daily.  Following that visit, she then traveled to [place] with her family for a ski trip.  Unfortunately, after the trip, the whole family was diagnosed with COVID-19.  She ended up receiving antibody infusions as a treatment.  Her cough has continued unabated since that time despite nearly 3 weeks of systemic steroids.  Since the last visit, she has continued to have some problems. She remains on steroids and she thinks that they are working somewhat. She does have Tessalon pearls. They are not really helping  at all. Her cough overall is better and seemes to get worse mostly at night. She has beenn using a humidifier at night. In total, she has had this for weeks. She is "looking for answers".  She has had nearly an entire month of coughing since she was diagnosed with COVID-19 after her ski trip.  Her coughing has been ongoing for "all her life".  She tells me that her 2 adult children cannot remember the last time that she was not coughing.  It semes to get worse in the spring and the fall as well as winter. During the summer, her smyptoms have been so bad that she was referred to otolaryngology at [facility].  She had a scope that showed thrush on her vocal cords.  She received a course of fluconazole and felt better after 2 days of fluconazole.  That she was getting thrush on her vocal cords. She had to go see ENT at [facility]. She had thrush everywhere and could not talk. She was prescribed fluconazole and got her voice Bradley.   She is going to see a Diplomatic Services operational officer at [employer]. She was having more and more problems over the summer with steroids and albuterol nebs. She is on 3 tablets of prednisone daily now and is continuing to wean from her COVID-19 diagnosis and subsequent exacerbation. She is unclear whether she "even has asthma". She has never been a smoker at all.   Otherwise, there have been no changes to her past medical history, surgical history, family history, or social history.  Assessment and Plan:  Paula Bradley is a 59 y.o.  female with:  Asthma, not well controlled, moderate persistent, with acute exacerbation - initiating Fasenra  Allergic rhinitis   LRPD   Severe persistent asthma - complicated by a COVID-19 - We are going to go ahead and start an injectable medicine Harrington Challenger). - Sample given today.  - Tammy from our our office will reach out to discuss.  - Continue with all of your inhalers for now. - Continue with the prednisone daily  - Prescription for Tussionex provided.  - We are  adding on a week of fluconazole as well in case you have developed Candidal esophagitis again.   Return in about 4 weeks (around 02/19/2020). This can be an in-person, a virtual Webex or a telephone follow up visit.  Diagnostics: None.  Medication List:  Current Outpatient Medications  Medication Sig Dispense Refill  . azelastine (ASTELIN) 0.1 % nasal spray Place 2 sprays into both nostrils 2 (two) times daily. Use in each nostril as directed 30 mL 5  . beclomethasone (QVAR) 40 MCG/ACT inhaler Use prn asthma flares 2 puffs twice a day 10.6 g 5  . budesonide-formoterol (SYMBICORT) 160-4.5 MCG/ACT inhaler Inhale 2 puffs into the lungs 2 (two) times daily. 1 Inhaler 5  . cetirizine (ZYRTEC) 10 MG tablet Take 10 mg by mouth daily as needed.     . Cholecalciferol (VITAMIN D) 2000 UNITS tablet Take 2,000 Units by mouth daily.      . clonazePAM (KLONOPIN) 1 MG tablet Take 1 tablet (1 mg total) by mouth 2 (two) times daily. 60 tablet 2  . Coenzyme Q10 200 MG capsule Take 200 mg by mouth daily.     . DULoxetine (CYMBALTA) 20 MG capsule Take 20 mg by mouth daily.    Marland Kitchen levalbuterol (XOPENEX HFA) 45 MCG/ACT inhaler Inhale 2 puffs into the lungs every 4 (four) hours as needed for wheezing or shortness of breath. 15 g 5  . levalbuterol (XOPENEX) 1.25 MG/3ML nebulizer solution Take 1.25 mg by nebulization every 4 (four) hours as needed for wheezing. 72 mL 1  . Misc Natural Products (TART CHERRY ADVANCED) CAPS Take 1 capsule by mouth daily.    . Multiple Vitamin (MULTIVITAMIN) tablet Take 1 tablet by mouth daily.    . Omega-3 Fatty Acids (FISH OIL) 1000 MG CAPS Take by mouth.    . predniSONE (DELTASONE) 10 MG tablet Take 3 tabs (30mg ) twice daily for 3 days, then 2 tabs (20mg ) twice daily for 3 days, then 1 tab (10mg ) twice daily for 3 days, then STOP. 36 tablet 0  . TURMERIC PO Take 1 capsule by mouth 2 (two) times daily.     . chlorpheniramine-HYDROcodone (TUSSIONEX PENNKINETIC ER) 10-8 MG/5ML SUER Take 5  mLs by mouth every 12 (twelve) hours as needed for cough. 140 mL 0  . EPINEPHrine (EPIPEN 2-PAK) 0.3 mg/0.3 mL IJ SOAJ injection Inject 0.3 mLs (0.3 mg total) into the muscle as needed for anaphylaxis. 2 each 1  . fluconazole (DIFLUCAN) 100 MG tablet Take 1 tablet (100 mg total) by mouth daily for 14 days. 14 tablet 0   Current Facility-Administered Medications  Medication Dose Route Frequency Provider Last Rate Last Admin  . Benralizumab SOSY 30 mg  30 mg Subcutaneous Q28 days , MD   30 mg at 01/22/20 1705   Allergies: Allergies  Allergen Reactions  . Statins     Myalgias on simvastatin 20mg  daily, rosuvastatin 5mg  and 20mg  daily, atorvastatin 10mg  daily, and pitavastatin 1mg  daily  . Fluticasone-Salmeterol Hives  REACTION: hives REACTION: hives  . Scallops [Shellfish Allergy] Hives   I reviewed her past medical history, social history, family history, and environmental history and no significant changes have been reported from previous visits.  Review of Systems  Constitutional: Negative for activity change, appetite change, chills, fatigue and fever.  HENT: Positive for rhinorrhea. Negative for congestion, postnasal drip, sinus pressure, sore throat and voice change.   Eyes: Negative for pain, discharge, redness and itching.  Respiratory: Positive for cough, chest tightness, shortness of breath and wheezing. Negative for stridor.   Gastrointestinal: Negative for diarrhea, nausea and vomiting.  Endocrine: Negative for cold intolerance and heat intolerance.  Musculoskeletal: Negative for arthralgias, joint swelling and myalgias.  Skin: Negative for rash.  Allergic/Immunologic: Negative for environmental allergies, food allergies and immunocompromised state.    Objective:  Physical exam not obtained as encounter was done via telephone.   Previous notes and tests were reviewed.  I discussed the assessment and treatment plan with the patient. The patient was  provided an opportunity to ask questions and all were answered. The patient agreed with the plan and demonstrated an understanding of the instructions.   The patient was advised to call Bradley or seek an in-person evaluation if the symptoms worsen or if the condition fails to improve as anticipated.  I provided 25 minutes of non-face-to-face time during this encounter.  It was my pleasure to participate in [place] care today. Please feel free to contact me with any questions or concerns.   Sincerely,  Valentina Shaggy, MD

## 2020-01-22 NOTE — Patient Instructions (Addendum)
Severe persistent asthma - complicated by a COVID-19 - We are going to go ahead and start an injectable medicine Harrington Challenger). - Sample given today.  - Tammy from our our office will reach out to discuss.  - Continue with all of your inhalers for now. - Continue with the prednisone daily  - Prescription for Tussionex provided.  Return in about 4 weeks (around 02/19/2020). This can be an in-person, a virtual Webex or a telephone follow up visit.   Please inform us of any Emergency Department visits, hospitalizations, or changes in symptoms. Call us before going to the ED for breathing or allergy symptoms since we might be able to fit you in for a sick visit. Feel free to contact us anytime with any questions, problems, or concerns.  It was a pleasure to talk to you today today!  Websites that have reliable patient information: 1. American Academy of Asthma, Allergy, and Immunology: www.aaaai.org 2. Food Allergy Research and Education (FARE): foodallergy.org 3. Mothers of Asthmatics: http://www.asthmacommunitynetwork.org 4. American College of Allergy, Asthma, and Immunology: www.acaai.org   COVID-19 Vaccine Information can be found at: [email or web address].[email or web address] For questions related to vaccine distribution or appointments, please email [email or web address] or call [phone].     "Like" [place] on Facebook and Instagram for our latest updates!        Make sure you are registered to vote! If you have moved or changed any of your contact information, you will need to get this updated before voting!  In some cases, you MAY be able to register to vote online: [email or web address]

## 2020-01-22 NOTE — Progress Notes (Signed)
Immunotherapy   Patient Details  Name: Paula Bradley MRN: [id number] Date of Birth: 12-Dec-1961  01/22/2020  [name] started injections for  Harrington Challenger First 3 injections every 4 weeks, then every 8 weeks.  Epi-Pen:Prescription for Epi-Pen given Consent signed and patient instructions given. Patient waited 30 minutes in office.    Exie Parody 01/22/2020, 5:04 PM

## 2020-02-04 ENCOUNTER — Telehealth: Payer: Self-pay | Admitting: *Deleted

## 2020-02-04 DIAGNOSIS — J4540 Moderate persistent asthma, uncomplicated: Secondary | ICD-10-CM

## 2020-02-04 NOTE — Telephone Encounter (Signed)
I have tried to get approval for patient for Harrington Challenger and her Ins is denying medication.   I have already written appeal with daily oral steroid use and and they are wanting to see history of EOS in place even with her steroid dependent asthma.  I am unsure I can get approval without same

## 2020-02-06 NOTE — Telephone Encounter (Signed)
I think this is only way to get approval for her

## 2020-02-06 NOTE — Telephone Encounter (Signed)
Should we get another CBC and hope for the best?  Malachi Bonds, MD [facility]

## 2020-02-07 NOTE — Telephone Encounter (Signed)
Called and spoke with patient and advised. She stated that she just finished her prednisone on February 1st and just finished her Diflucan today and was wondering if that would affect the CBC? Please advise.

## 2020-02-07 NOTE — Telephone Encounter (Signed)
Ordered CBC with diff. Can someone call the patient to let her know? She can come to the [place] office or any [facility] for that matter.   Malachi Bonds, MD [facility]

## 2020-02-07 NOTE — Addendum Note (Signed)
Addended by: Alfonse Spruce on: 02/07/2020 03:01 PM   Modules accepted: Orders

## 2020-02-08 NOTE — Telephone Encounter (Signed)
It probably will. Let's wait one more week.   Malachi Bonds, MD [facility]

## 2020-02-08 NOTE — Telephone Encounter (Signed)
Called and advised to patient. Patient verbalized understanding and has an OV next Wednesday and will get the labs drawn then.

## 2020-02-13 ENCOUNTER — Encounter: Payer: Self-pay | Admitting: Allergy

## 2020-02-13 ENCOUNTER — Other Ambulatory Visit: Payer: Self-pay

## 2020-02-13 ENCOUNTER — Ambulatory Visit (INDEPENDENT_AMBULATORY_CARE_PROVIDER_SITE_OTHER): Payer: 59 | Admitting: Allergy

## 2020-02-13 VITALS — BP 112/72 | HR 75 | Temp 97.6°F | Resp 18

## 2020-02-13 DIAGNOSIS — K219 Gastro-esophageal reflux disease without esophagitis: Secondary | ICD-10-CM

## 2020-02-13 DIAGNOSIS — J3089 Other allergic rhinitis: Secondary | ICD-10-CM | POA: Diagnosis not present

## 2020-02-13 DIAGNOSIS — J4540 Moderate persistent asthma, uncomplicated: Secondary | ICD-10-CM | POA: Diagnosis not present

## 2020-02-13 NOTE — Patient Instructions (Addendum)
Severe persistent asthma - complicated by a COVID-19 - received antibody infusion for management of COVID-19.   Now recovered and considered not contagious at this point.  Recommend you receive COVID-19 vaccine however current recommendation is to wait at least 3 months from your antibody infusion before receiving any vaccine - Fasenra injections started on 01/22/2020.  Next injection 4 weeks from start day.   Will obtain CBC w diff today to assist with approval.  Hopeful that Paula Bradley will provide control to decrease need for steroid inhaler use - Hold Symbicort daily use at this time   - During asthma flares/respiratory illnesses can use Symbicort twice a day.  However steroid inhaler use has led to thrush thus will try to avoid use if possible.  -  Use Xopenex 2 puffs every 4-6 hours as needed for cough/wheeze/shortness of breath/chest tightness.   May use 15-20 minutes prior to exercise/activity if needed.  Monitor frequency of use.  - Pulmonology appt at [facility] in March  Control goals:   Full participation in all desired activities (may need albuterol before activity)  Albuterol use two time or less a week on average (not counting use with activity)  Cough interfering with sleep two time or less a month  Oral steroids no more than once a year  No hospitalizations   Allergic rhinitis  - continue allergen avoidance measures  - For nasal drainage use nasal antihistamine, Astelin 2 sprays each nostril 1-2 times a day.  Use with proper nasal spray technique (point tip of spray toward ear of same side nostril).   - For nasal congestion use nasal steroid spray like Nasocort or Flonase 2 sprays each nostril daily if needed (use for 1-2 weeks at a time before stopping once symptoms improve).    Reflux  - continue Prilosec in AM for control     Return to clinic in 4 months or sooner if needed

## 2020-02-13 NOTE — Progress Notes (Signed)
Follow-up Note  RE: Paula Bradley MRN: [id number] DOB: 1961-06-27 Date of Office Visit: 02/13/2020    History of present illness: Paula Bradley is a 59 y.o. female presenting today for follow-up of asthma and allergic rhinitis.   She was last seen in the office on 01/22/2020 by Dr. Dellis Anes.   She did have Covid-19 illness earlier this year and did receive monoclonal antibody therapy.  With her elbow she had significant cough that was "nonstop".  She was using her inhalers both Symbicort and Qvar for additional steroid needs as well as taking prednisone.  She states even with this she still continues to have cough.  The test next also was not extremely helpful in controlling her cough.  She also developed hoarseness which was thought to be secondary to thrush likely from her inhaler use as this has been a previous issue for her in the past.  Dr. Dellis Anes did prescribe her fluconazole which helped to treat the thrush and this resolved her hoarseness.  He also provided her with a sample dose of Fasenra to get this started to use for improved respiratory control.  She states since taking the fluconazole that she did not use any more of the inhalers and she has not had any further issues with hoarseness and her cough has improved.  On February 8 however she states she developed chest pains that was related to reflux and she has been taking a PPI in the morning and Pepcid at night.  She does state that this has been helpful but she is not sure why she is now having issues with her reflux.  She also complains of a pain that she may feel right underneath her left breast and sometimes she can push on the area and reproduce the pain.  She does have a pulmonology appointment scheduled at [facility] for March 3.   Review of systems: Review of Systems  Constitutional: Positive for malaise/fatigue.  HENT: Positive for congestion.   Eyes: Negative.   Respiratory: Positive for cough, shortness of breath  and wheezing.   Cardiovascular: Positive for chest pain.  Gastrointestinal: Positive for heartburn.  Musculoskeletal: Negative.   Skin: Negative.   Neurological: Negative.     All other systems negative unless noted above in HPI  Past medical/social/surgical/family history have been reviewed and are unchanged unless specifically indicated below.  No changes  Medication List: Current Outpatient Medications  Medication Sig Dispense Refill  . Cholecalciferol (VITAMIN D) 2000 UNITS tablet Take 2,000 Units by mouth daily.      . clonazePAM (KLONOPIN) 1 MG tablet Take 1 tablet (1 mg total) by mouth 2 (two) times daily. 60 tablet 2  . Coenzyme Q10 200 MG capsule Take 200 mg by mouth daily.     Marland Kitchen EPINEPHrine (EPIPEN 2-PAK) 0.3 mg/0.3 mL IJ SOAJ injection Inject 0.3 mLs (0.3 mg total) into the muscle as needed for anaphylaxis. 2 each 1  . levalbuterol (XOPENEX HFA) 45 MCG/ACT inhaler Inhale 2 puffs into the lungs every 4 (four) hours as needed for wheezing or shortness of breath. 15 g 5  . levalbuterol (XOPENEX) 1.25 MG/3ML nebulizer solution Take 1.25 mg by nebulization every 4 (four) hours as needed for wheezing. 72 mL 1  . Multiple Vitamin (MULTIVITAMIN) tablet Take 1 tablet by mouth daily.    . Omega-3 Fatty Acids (FISH OIL) 1000 MG CAPS Take by mouth.     Current Facility-Administered Medications  Medication Dose Route Frequency Provider Last Rate Last Admin  .  Benralizumab SOSY 30 mg  30 mg Subcutaneous Q28 days Valentina Shaggy, MD   30 mg at 01/22/20 1705     Known medication allergies: Allergies  Allergen Reactions  . Statins     Myalgias on simvastatin 20mg  daily, rosuvastatin 5mg  and 20mg  daily, atorvastatin 10mg  daily, and pitavastatin 1mg  daily  . Fluticasone-Salmeterol Hives    REACTION: hives REACTION: hives  . Scallops [Shellfish Allergy] Hives     Physical examination: Blood pressure 112/72, pulse 75, temperature 97.6 F (36.4 C), temperature source Temporal,  resp. rate 18, SpO2 97 %.  General: Alert, interactive, in no acute distress. HEENT: PERRLA, TMs pearly gray, turbinates minimally edematous without discharge, post-pharynx non erythematous. Neck: Supple without lymphadenopathy. Lungs: Clear to auscultation without wheezing, rhonchi or rales. {no increased work of breathing. CV: Normal S1, S2 without murmurs. Abdomen: Nondistended, nontender. Skin: Warm and dry, without lesions or rashes. Extremities:  No clubbing, cyanosis or edema. Neuro:   Grossly intact.  Diagnositics/Labs: None today   Assessment and plan:   Severe persistent asthma - complicated by [id number] - received antibody infusion for management of COVID-19.   Now recovered and considered not contagious at this point.  Recommend you receive COVID-19 vaccine however current recommendation is to wait at least 3 months from your antibody infusion before receiving any vaccine - Fasenra injections started on 01/22/2020.  Next injection 4 weeks from start day.   Will obtain CBC w diff today to assist with approval.  Hopeful that Berna Bue will provide control to decrease need for steroid inhaler use - Hold Symbicort daily use at this time   - During asthma flares/respiratory illnesses can use Symbicort twice a day.  However steroid inhaler use has led to thrush thus will try to avoid use if possible.  -  Use Xopenex 2 puffs every 4-6 hours as needed for cough/wheeze/shortness of breath/chest tightness.   May use 15-20 minutes prior to exercise/activity if needed.  Monitor frequency of use.  - believe chest pain under breast is costochondritis related to excessive coughing - Pulmonology appt at [facility] in March  Control goals:   Full participation in all desired activities (may need albuterol before activity)  Albuterol use two time or less a week on average (not counting use with activity)  Cough interfering with sleep two time or less a month  Oral steroids no more than once a  year  No hospitalizations   Allergic rhinitis  - continue allergen avoidance measures  - For nasal drainage use nasal antihistamine, Astelin 2 sprays each nostril 1-2 times a day.  Use with proper nasal spray technique (point tip of spray toward ear of same side nostril).   - For nasal congestion use nasal steroid spray like Nasocort or Flonase 2 sprays each nostril daily if needed (use for 1-2 weeks at a time before stopping once symptoms improve).    Reflux  - continue Prilosec in AM for control     Return to clinic in 4 months or sooner if needed   I appreciate the opportunity to take part in Kaloni's care. Please do not hesitate to contact me with questions.  Sincerely,   Prudy Feeler, MD Allergy/Immunology [facility]

## 2020-02-14 LAB — CBC WITH DIFFERENTIAL/PLATELET
Basophils Absolute: 0 10*3/uL (ref 0.0–0.2)
Basos: 0 %
EOS (ABSOLUTE): 0 10*3/uL (ref 0.0–0.4)
Eos: 0 %
Hematocrit: 40.9 % (ref 34.0–46.6)
Hemoglobin: 13.9 g/dL (ref 11.1–15.9)
Immature Grans (Abs): 0 10*3/uL (ref 0.0–0.1)
Immature Granulocytes: 0 %
Lymphocytes Absolute: 1.1 10*3/uL (ref 0.7–3.1)
Lymphs: 19 %
MCH: 31 pg (ref 26.6–33.0)
MCHC: 34 g/dL (ref 31.5–35.7)
MCV: 91 fL (ref 79–97)
Monocytes Absolute: 0.7 10*3/uL (ref 0.1–0.9)
Monocytes: 12 %
Neutrophils Absolute: 4.1 10*3/uL (ref 1.4–7.0)
Neutrophils: 69 %
Platelets: 260 10*3/uL (ref 150–450)
RBC: 4.48 x10E6/uL (ref 3.77–5.28)
RDW: 12.4 % (ref 11.7–15.4)
WBC: 6 10*3/uL (ref 3.4–10.8)

## 2020-02-14 NOTE — Telephone Encounter (Signed)
Can someone call the patient to let her know that her eosinophils were absent? I am still hopeful we can get the medication approved. Dr. Delorse Lek followed up with her and she is actually doing very well with it on board.   Malachi Bonds, MD [facility]

## 2020-02-15 NOTE — Telephone Encounter (Signed)
Patient does not understand why she needs the medication if her eosinophils are absent. She would like to know if there is another reason she should be on it? She is finished with the steroids and still feels fine. Patient would like to know what the benefit is, if any, of her continuing the [place]? Please contact patient with further info?

## 2020-02-15 NOTE — Telephone Encounter (Signed)
Yes she is doing much better.   I figure this proves the Harrington Challenger does what it claims.  She also has had steroids recently related to her Covid care.

## 2020-02-18 NOTE — Telephone Encounter (Signed)
Spoke to patient and she is doing very well right now and has appt to see [facility] pulmonology on Mar 3 and I explained to patient she could be doing well from steroids and or Fasenra. I advised her that her Ins denied her appeal due to no past history of EOS that qualify her for drug.  I also advised her I was appealing again and if for some reason we cannot get her on Fasenra we could try other options.  She advised she still wants to wait until pulmo appt and will reach back out once she has appt

## 2020-02-19 NOTE — Telephone Encounter (Signed)
Noted. That is reasonable. She might not have had eosinophils this time since we gave her Harrington Challenger, but I talked with Dr. Delorse Lek and she has never had an eosinophilic phenotype. We were at the end of the rope with what to do with her. Regardless, I am happy she is doing better.  Malachi Bonds, MD [facility]

## 2020-02-20 ENCOUNTER — Ambulatory Visit: Payer: 59

## 2020-04-02 ENCOUNTER — Ambulatory Visit: Payer: 59 | Admitting: Allergy

## 2020-04-17 ENCOUNTER — Ambulatory Visit: Payer: 59

## 2020-04-17 ENCOUNTER — Ambulatory Visit: Payer: 59 | Attending: Internal Medicine

## 2020-04-17 DIAGNOSIS — Z23 Encounter for immunization: Secondary | ICD-10-CM

## 2020-04-17 NOTE — Progress Notes (Signed)
   Covid-19 Vaccination Clinic  Name:  Sosha Shepherd    MRN: [id number] DOB: 12-08-1961  04/17/2020  Ms. Domanski was observed post Covid-19 immunization for 15 minutes without incident. She was provided with Vaccine Information Sheet and instruction to access the V-Safe system.   Ms. Weisse was instructed to call 911 with any severe reactions post vaccine: Marland Kitchen Difficulty breathing  . Swelling of face and throat  . A fast heartbeat  . A bad rash all over body  . Dizziness and weakness   Immunizations Administered    Name Date Dose VIS Date Route   Pfizer COVID-19 Vaccine 04/17/2020  9:46 AM 0.3 mL 02/20/2019 Intramuscular   Manufacturer: [employer], [employer]   Lot: [id number]   NDC: [address]-7

## 2020-05-12 ENCOUNTER — Ambulatory Visit: Payer: 59 | Attending: Internal Medicine

## 2020-05-12 DIAGNOSIS — Z23 Encounter for immunization: Secondary | ICD-10-CM

## 2020-05-12 NOTE — Progress Notes (Signed)
   Covid-19 Vaccination Clinic  Name:  Paula Bradley    MRN: [id number] DOB: 04/17/1961  05/12/2020  Paula Bradley was observed post Covid-19 immunization for 30 minutes based on pre-vaccination screening without incident. She was provided with Vaccine Information Sheet and instruction to access the V-Safe system.   Paula Bradley was instructed to call 911 with any severe reactions post vaccine: Marland Kitchen Difficulty breathing  . Swelling of face and throat  . A fast heartbeat  . A bad rash all over body  . Dizziness and weakness   Immunizations Administered    Name Date Dose VIS Date Route   Pfizer COVID-19 Vaccine 05/12/2020  9:50 AM 0.3 mL 02/20/2019 Intramuscular   Manufacturer: [employer], [employer]   Lot: [id number]   NDC: [address]-5

## 2020-06-04 ENCOUNTER — Ambulatory Visit: Payer: 59 | Admitting: Allergy

## 2020-06-04 ENCOUNTER — Ambulatory Visit: Payer: 59 | Admitting: Internal Medicine

## 2020-06-08 ENCOUNTER — Ambulatory Visit (HOSPITAL_COMMUNITY)
Admission: RE | Admit: 2020-06-08 | Discharge: 2020-06-08 | Disposition: A | Discharge status: Home / Self Care | Payer: 59 | Attending: Psychiatry | Admitting: Psychiatry

## 2020-06-08 DIAGNOSIS — F419 Anxiety disorder, unspecified: Secondary | ICD-10-CM | POA: Diagnosis not present

## 2020-06-08 DIAGNOSIS — F329 Major depressive disorder, single episode, unspecified: Secondary | ICD-10-CM | POA: Diagnosis not present

## 2020-06-08 DIAGNOSIS — R634 Abnormal weight loss: Secondary | ICD-10-CM | POA: Diagnosis not present

## 2020-06-08 DIAGNOSIS — Z79899 Other long term (current) drug therapy: Secondary | ICD-10-CM | POA: Insufficient documentation
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
## 2020-06-11 ENCOUNTER — Telehealth: Payer: Self-pay

## 2020-06-11 NOTE — Telephone Encounter (Signed)
Patient called wants to let you know that she hasn't been able to reschedule her appointment due to family crisis and she said this morning her father passed and she just wanted to let you know.   Phone [phone]

## 2020-06-27 ENCOUNTER — Telehealth: Payer: Self-pay | Admitting: *Deleted

## 2020-06-27 MED ORDER — ESTRADIOL 0.05 MG/24HR TD PTTW: 1.0000 | MEDICATED_PATCH | TRANSDERMAL | 4 refills | Status: DC

## 2020-06-27 MED ORDER — PROGESTERONE MICRONIZED 100 MG PO CAPS: 100.0000 mg | ORAL_CAPSULE | Freq: Every day | ORAL | 4 refills | Status: DC

## 2020-06-27 NOTE — Telephone Encounter (Signed)
Patient informed, Rx sent.  

## 2020-06-27 NOTE — Telephone Encounter (Signed)
If no new changes to medical history it looks like Dr. Audie Box discussed it with her including the risks, so she can do the Vivelle 0.05 mg patch and Prometrium 100 mg nightly. Thank you. Call us if any vaginal bleeding or problems.

## 2020-06-27 NOTE — Telephone Encounter (Signed)
Patient called requesting to start on HRT, was prescribed  Vivelle 0.05 mg patch and Prometrium 100 mg night at OV 10/30/19 on but she never started medication due to her a good friend being diagnosed with breast cancer and this scared her.  Patient now wishes to start on HRT, reports having having hot flushes and sweats also having anxiety and depression. She takes Klonopin 1 mg tablet and Paxil prescribed by PCP,reports she thought maybe taking HRT will help with anxiety and depression as well. She is currently living in [place], [place] for the summer and will to [place] in the fall.  Please advise

## 2020-07-15 ENCOUNTER — Encounter: Payer: Self-pay | Admitting: Obstetrics and Gynecology

## 2020-11-18 ENCOUNTER — Encounter: Payer: 59 | Admitting: Obstetrics and Gynecology

## 2020-12-04 ENCOUNTER — Ambulatory Visit (INDEPENDENT_AMBULATORY_CARE_PROVIDER_SITE_OTHER): Payer: 59 | Admitting: Gastroenterology

## 2020-12-04 ENCOUNTER — Encounter: Payer: Self-pay | Admitting: Gastroenterology

## 2020-12-04 VITALS — BP 120/70 | HR 76 | Ht 65.5 in | Wt 198.2 lb

## 2020-12-04 DIAGNOSIS — Z1211 Encounter for screening for malignant neoplasm of colon: Secondary | ICD-10-CM

## 2020-12-04 NOTE — Progress Notes (Signed)
[facility] Progress Note  Chief Complaint: Colon cancer screen  Subjective  History: I have previously seen Paula Bradley for episodic epigastric to right upper quadrant pain sounding like biliary colic, however ultrasound negative for stones at least 2 occasions.  Normal upper endoscopy December 2019, normal [facility] HIDA scan January 2020. Luster Landsberg tells me that problem resolved and has not recurred since then  Last colonoscopy by Dr. Juanda Chance September 2011 complete exam, good prep, no adenomatous polyps.  She denies chronic abdominal pain, she has regular bowel habits and denies rectal bleeding.  Denies heartburn dysphagia nausea vomiting or weight loss  ROS: Cardiovascular:  no chest pain Respiratory: no dyspnea Earlier this year had protracted cough for months after Covid infection The patient's Past Medical, Family and Social History were reviewed and are on file in the EMR. Past Medical History:  Diagnosis Date  . Allergic rhinitis   . Anxiety   . Anxiety and depression   . ASCUS of cervix with negative high risk HPV 09/2018, 10/2019   E[facility] Pap colposcopy showed LGSIL  . Asthma   . Depression   . Hyperlipidemia   . Kidney stone   . Menopause    Past Surgical History:  Procedure Laterality Date  . CESAREAN SECTION  1996  . COLPOSCOPY    . DILATION AND CURETTAGE OF UTERUS    . TONSILLECTOMY  1985    Objective:  Med list reviewed  Current Outpatient Medications:  .  Cholecalciferol (VITAMIN D) 2000 UNITS tablet, Take 2,000 Units by mouth daily., Disp: , Rfl:  .  clonazePAM (KLONOPIN) 1 MG tablet, Take 1 tablet (1 mg total) by mouth 2 (two) times daily., Disp: 60 tablet, Rfl: 2 .  Coenzyme Q10 200 MG capsule, Take 200 mg by mouth daily. , Disp: , Rfl:  .  Multiple Vitamin (MULTIVITAMIN) tablet, Take 1 tablet by mouth daily., Disp: , Rfl:  .  Omega-3 Fatty Acids (FISH OIL) 1000 MG CAPS, Take by mouth., Disp: , Rfl:  .  PARoxetine (PAXIL-CR) 25 MG 24 hr tablet, Take 25 mg  by mouth daily., Disp: , Rfl:  .  progesterone (PROMETRIUM) 100 MG capsule, Take 1 capsule (100 mg total) by mouth at bedtime., Disp: 30 capsule, Rfl: 4   Vital signs in last 24 hrs: Vitals:   12/04/20 1517  BP: 120/70  Pulse: 76    Physical Exam  Well-appearing  HEENT: sclera anicteric, oral mucosa moist without lesions  Neck: supple, no thyromegaly, JVD or lymphadenopathy  Cardiac: RRR without murmurs, S1S2 heard, no peripheral edema  Pulm: clear to auscultation bilaterally, normal RR and effort noted  Abdomen: soft, no tenderness, with active bowel sounds. No guarding or palpable hepatosplenomegaly.  Labs:   ___________________________________________ Radiologic studies:   ____________________________________________ Other:   _____________________________________________ Assessment & Plan  Assessment: Encounter Diagnosis  Name Primary?  . Special screening for malignant neoplasms, colon Yes    Average risk for colorectal cancer.  She was agreeable to a colonoscopy after discussion of procedure and risks  The benefits and risks of the planned procedure were described in detail with the patient or (when appropriate) their health care proxy.  Risks were outlined as including, but not limited to, bleeding, infection, perforation, adverse medication reaction leading to cardiac or pulmonary decompensation, pancreatitis (if ERCP).  The limitation of incomplete mucosal visualization was also discussed.  No guarantees or warranties were given.  Luster Landsberg thinks that she would like to do it toward the latter part of next  month as of some other appointments and the upcoming holidays.  20 minutes were spent on this encounter (including chart review, history/exam, counseling/coordination of care, and documentation)  Charlie Pitter III

## 2020-12-04 NOTE — Patient Instructions (Signed)
If you are age 61 or older, your body mass index should be between 23-30. Your Body mass index is 32.48 kg/m. If this is out of the aforementioned range listed, please consider follow up with your Primary Care Provider.  If you are age 70 or younger, your body mass index should be between 19-25. Your Body mass index is 32.48 kg/m. If this is out of the aformentioned range listed, please consider follow up with your Primary Care Provider.  It has been recommended to you by your physician that you have a(n) colonoscopy completed. Per your request, we did not schedule the procedure(s) today. Please contact our office at [phone] should you decide to have the procedure completed. You will be scheduled for a pre-visit and procedure at that time.   It was a pleasure to see you today!  Dr. Myrtie Neither

## 2021-01-21 ENCOUNTER — Ambulatory Visit (AMBULATORY_SURGERY_CENTER): Payer: Self-pay | Admitting: *Deleted

## 2021-01-21 ENCOUNTER — Other Ambulatory Visit: Payer: Self-pay

## 2021-01-21 VITALS — Ht 65.5 in | Wt 198.0 lb

## 2021-01-21 DIAGNOSIS — Z1211 Encounter for screening for malignant neoplasm of colon: Secondary | ICD-10-CM

## 2021-01-21 MED ORDER — PLENVU 140 G PO SOLR: 1.0000 | ORAL | 0 refills | Status: DC

## 2021-01-21 NOTE — Progress Notes (Signed)
No egg or soy allergy known to patient  No issues with past sedation with any surgeries or procedures No intubation problems in the past  No FH of Malignant Hyperthermia No diet pills per patient No home 02 use per patient  No blood thinners per patient  Pt denies issues with constipation  No A fib or A flutter  EMMI video to pt or via MyChart  COVID 19 guidelines implemented in PV today with Pt and RN  Pt is fully vaccinated  for Covid   Plenvu  Coupon given to pt in PV today , Code to Pharmacy - Explained NO PA's for preps   Due to the COVID-19 pandemic we are asking patients to follow certain guidelines.  Pt aware of COVID protocols and [facility] guidelines

## 2021-01-22 ENCOUNTER — Encounter: Payer: Self-pay | Admitting: Gastroenterology

## 2021-02-04 ENCOUNTER — Other Ambulatory Visit: Payer: Self-pay

## 2021-02-04 ENCOUNTER — Ambulatory Visit (AMBULATORY_SURGERY_CENTER): Payer: 59 | Admitting: Gastroenterology

## 2021-02-04 ENCOUNTER — Encounter: Payer: 59 | Admitting: Gastroenterology

## 2021-02-04 ENCOUNTER — Encounter: Payer: Self-pay | Admitting: Gastroenterology

## 2021-02-04 VITALS — BP 119/70 | HR 57 | Temp 96.0°F | Resp 16 | Ht 65.5 in | Wt 198.0 lb

## 2021-02-04 DIAGNOSIS — Z1211 Encounter for screening for malignant neoplasm of colon: Secondary | ICD-10-CM

## 2021-02-04 MED ORDER — SODIUM CHLORIDE 0.9 % IV SOLN: 500.0000 mL | Freq: Once | INTRAVENOUS | Status: DC

## 2021-02-04 NOTE — Progress Notes (Signed)
Pt's states no medical or surgical changes since previsit or office visit.  VS taken by BC 

## 2021-02-04 NOTE — Op Note (Signed)
[facility] Patient Name: Paula Bradley Procedure Date: 02/04/2021 10:57 AM MRN: [id number] Endoscopist: Sherilyn Cooter L. Myrtie Neither , MD Age: 60 Referring MD:  Date of Birth: 06/22/1961 Gender: Female Account #: [id number] Procedure:                Colonoscopy Indications:              Screening for colorectal malignant neoplasm (last                            colonoscopy 08/2010) Medicines:                Monitored Anesthesia Care Procedure:                Pre-Anesthesia Assessment:                           - Prior to the procedure, a History and Physical                            was performed, and patient medications and                            allergies were reviewed. The patient's tolerance of                            previous anesthesia was also reviewed. The risks                            and benefits of the procedure and the sedation                            options and risks were discussed with the patient.                            All questions were answered, and informed consent                            was obtained. Prior Anticoagulants: The patient has                            taken no previous anticoagulant or antiplatelet                            agents. ASA Grade Assessment: II - A patient with                            mild systemic disease. After reviewing the risks                            and benefits, the patient was deemed in                            satisfactory condition to undergo the procedure.  After obtaining informed consent, the colonoscope                            was passed under direct vision. Throughout the                            procedure, the patient's blood pressure, pulse, and                            oxygen saturations were monitored continuously. The                            Olympus [id number]) [id number] was introduced                            through the anus and advanced to the  the terminal                            ileum, with identification of the appendiceal                            orifice and IC valve. The colonoscopy was performed                            without difficulty. The patient tolerated the                            procedure well. The quality of the bowel                            preparation was excellent. The ileocecal valve,                            appendiceal orifice, and rectum were photographed.                            The bowel preparation used was Plenvu. Scope In: 11:26:39 AM Scope Out: 11:38:18 AM Scope Withdrawal Time: 0 hours 9 minutes 2 seconds  Total Procedure Duration: 0 hours 11 minutes 39 seconds  Findings:                 The perianal and digital rectal examinations were                            normal.                           The entire examined colon appeared normal on direct                            and retroflexion views. Complications:            No immediate complications. Estimated Blood Loss:     Estimated blood loss: none. Impression:               - The entire examined  colon is normal on direct and                            retroflexion views.                           - No specimens collected. Recommendation:           - Patient has a contact number available for                            emergencies. The signs and symptoms of potential                            delayed complications were discussed with the                            patient. Return to normal activities tomorrow.                            Written discharge instructions were provided to the                            patient.                           - Resume previous diet.                           - Continue present medications.                           - Repeat colonoscopy in 10 years for screening                            purposes. Kendry Pfarr L. Myrtie Neither, MD 02/04/2021 11:41:32 AM This report has been signed electronically.

## 2021-02-04 NOTE — Patient Instructions (Signed)
Discharge instructions given. Normal exam. Resume previous medications. YOU HAD AN ENDOSCOPIC PROCEDURE TODAY AT THE [facility]:   Refer to the procedure report that was given to you for any specific questions about what was found during the examination.  If the procedure report does not answer your questions, please call your gastroenterologist to clarify.  If you requested that your care partner not be given the details of your procedure findings, then the procedure report has been included in a sealed envelope for you to review at your convenience later.  YOU SHOULD EXPECT: Some feelings of bloating in the abdomen. Passage of more gas than usual.  Walking can help get rid of the air that was put into your GI tract during the procedure and reduce the bloating. If you had a lower endoscopy (such as a colonoscopy or flexible sigmoidoscopy) you may notice spotting of blood in your stool or on the toilet paper. If you underwent a bowel prep for your procedure, you may not have a normal bowel movement for a few days.  Please Note:  You might notice some irritation and congestion in your nose or some drainage.  This is from the oxygen used during your procedure.  There is no need for concern and it should clear up in a day or so.  SYMPTOMS TO REPORT IMMEDIATELY:  Following lower endoscopy (colonoscopy or flexible sigmoidoscopy):  Excessive amounts of blood in the stool  Significant tenderness or worsening of abdominal pains  Swelling of the abdomen that is new, acute  Fever of 100F or higher   For urgent or emergent issues, a gastroenterologist can be reached at any hour by calling [phone]. Do not use MyChart messaging for urgent concerns.    DIET:  We do recommend a small meal at first, but then you may proceed to your regular diet.  Drink plenty of fluids but you should avoid alcoholic beverages for 24 hours.  ACTIVITY:  You should plan to take it easy for the rest of  today and you should NOT DRIVE or use heavy machinery until tomorrow (because of the sedation medicines used during the test).    FOLLOW UP: Our staff will call the number listed on your records 48-72 hours following your procedure to check on you and address any questions or concerns that you may have regarding the information given to you following your procedure. If we do not reach you, we will leave a message.  We will attempt to reach you two times.  During this call, we will ask if you have developed any symptoms of COVID 19. If you develop any symptoms (ie: fever, flu-like symptoms, shortness of breath, cough etc.) before then, please call [phone].  If you test positive for Covid 19 in the 2 weeks post procedure, please call and report this information to us.    If any biopsies were taken you will be contacted by phone or by letter within the next 1-3 weeks.  Please call us at [phone] if you have not heard about the biopsies in 3 weeks.    SIGNATURES/CONFIDENTIALITY: You and/or your care partner have signed paperwork which will be entered into your electronic medical record.  These signatures attest to the fact that that the information above on your After Visit Summary has been reviewed and is understood.  Full responsibility of the confidentiality of this discharge information lies with you and/or your care-partner.  

## 2021-02-04 NOTE — Progress Notes (Signed)
Pt Drowsy. VSS. To PACU, report to RN. No anesthetic complications noted.  

## 2021-02-06 ENCOUNTER — Telehealth: Payer: Self-pay

## 2021-02-06 ENCOUNTER — Telehealth: Payer: Self-pay | Admitting: *Deleted

## 2021-02-06 NOTE — Telephone Encounter (Signed)
First attempt follow up call to pt, lm on vm 

## 2021-02-06 NOTE — Telephone Encounter (Signed)
  Follow up Call-  Call back number 02/04/2021 12/13/2018  Post procedure Call Back phone  # [phone]  Permission to leave phone message Yes Yes  Some recent data might be hidden     Patient questions:  Do you have a fever, pain , or abdominal swelling? No. Pain Score  0 *  Have you tolerated food without any problems? Yes.    Have you been able to return to your normal activities? Yes.    Do you have any questions about your discharge instructions: Diet   No. Medications  No. Follow up visit  No.  Do you have questions or concerns about your Care? No.  Actions: * If pain score is 4 or above: No action needed, pain <4.  1. Have you developed a fever since your procedure? no  2.   Have you had an respiratory symptoms (SOB or cough) since your procedure? no  3.   Have you tested positive for COVID 19 since your procedure no  4.   Have you had any family members/close contacts diagnosed with the COVID 19 since your procedure?  no   If yes to any of these questions please route to Laverna Peace, RN and Karlton Lemon, RN

## 2021-02-25 ENCOUNTER — Other Ambulatory Visit: Payer: Self-pay | Admitting: Allergy

## 2021-12-30 ENCOUNTER — Other Ambulatory Visit: Payer: Self-pay

## 2021-12-30 ENCOUNTER — Ambulatory Visit (INDEPENDENT_AMBULATORY_CARE_PROVIDER_SITE_OTHER): Payer: Self-pay

## 2021-12-30 ENCOUNTER — Ambulatory Visit (INDEPENDENT_AMBULATORY_CARE_PROVIDER_SITE_OTHER): Payer: BC Managed Care – PPO | Admitting: Podiatry

## 2021-12-30 DIAGNOSIS — M79672 Pain in left foot: Secondary | ICD-10-CM

## 2021-12-30 DIAGNOSIS — M778 Other enthesopathies, not elsewhere classified: Secondary | ICD-10-CM

## 2021-12-30 MED ORDER — MELOXICAM 15 MG PO TABS: 15.0000 mg | ORAL_TABLET | Freq: Every day | ORAL | 0 refills | Status: AC

## 2021-12-31 ENCOUNTER — Telehealth: Payer: Self-pay | Admitting: Podiatry

## 2021-12-31 NOTE — Telephone Encounter (Signed)
Pt received an injection yesterday and would like to know when it's supposed to kick in. She states she's been taking meloxicam as well and is still in pain. Please advise.

## 2022-01-01 ENCOUNTER — Encounter: Payer: Self-pay | Admitting: Podiatry

## 2022-01-01 NOTE — Progress Notes (Signed)
Subjective:  Patient ID: Paula Bradley, female    DOB: 04-27-1961,  MRN: [id number]  Chief Complaint  Patient presents with   Foot Pain    Left foot pain  For a little over a week  Pain is on the top of the foot     61 y.o. female presents with the above complaint.  Patient presents with complaint of left midfoot pain.  She states been on for 1 week and has progressed to gotten worse is pain on top of the foot.  She had seen Dr. Loreta Ave in the past few years ago for the same thing for which a steroid injection helped keep it away for a long time.  She would like to discuss treatment options she has not seen anyone else prior to seeing me.  Pain scale is 5 out of 10 dull achy in nature especially worse when ambulating.   Review of Systems: Negative except as noted in the HPI. Denies N/V/F/Ch.  Past Medical History:  Diagnosis Date   Allergic rhinitis    Allergy    Anxiety    Anxiety and depression    Arthritis    OA   ASCUS of cervix with negative high risk HPV 09/2018, 10/2019   ECC Pap colposcopy showed LGSIL   Asthma    Cataract    removed both eyes   Depression    GERD (gastroesophageal reflux disease)    OCC- On Omeprazole as needed    Heart murmur    Hyperlipidemia    Kidney stone    Menopause     Current Outpatient Medications:    meloxicam (MOBIC) 15 MG tablet, Take 1 tablet (15 mg total) by mouth daily., Disp: 30 tablet, Rfl: 0   Ascorbic Acid (VITAMIN C) 1000 MG tablet, Take 1,000 mg by mouth daily., Disp: , Rfl:    Cholecalciferol (VITAMIN D) 2000 UNITS tablet, Take 2,000 Units by mouth daily., Disp: , Rfl:    clonazePAM (KLONOPIN) 1 MG tablet, Take 1 tablet (1 mg total) by mouth 2 (two) times daily., Disp: 60 tablet, Rfl: 2   Coenzyme Q10 200 MG capsule, Take 200 mg by mouth daily. , Disp: , Rfl:    GARLIC PO, Take by mouth., Disp: , Rfl:    levalbuterol (XOPENEX HFA) 45 MCG/ACT inhaler, Inhale 2 puffs into the lungs., Disp: , Rfl:    Multiple Vitamin  (MULTIVITAMIN) tablet, Take 1 tablet by mouth daily., Disp: , Rfl:    Omega-3 Fatty Acids (FISH OIL) 1000 MG CAPS, Take by mouth., Disp: , Rfl:    omeprazole (PRILOSEC) 20 MG capsule, Take 20 mg by mouth daily., Disp: , Rfl:    PARoxetine (PAXIL-CR) 25 MG 24 hr tablet, Take 25 mg by mouth daily., Disp: , Rfl:    Probiotic Product (PROBIOTIC PO), Take by mouth. Suprema Dophilus 5 billion 1 daily, Disp: , Rfl:    progesterone (PROMETRIUM) 100 MG capsule, Take 1 capsule (100 mg total) by mouth at bedtime. (Patient not taking: Reported on 02/04/2021), Disp: 30 capsule, Rfl: 4   simvastatin (ZOCOR) 5 MG tablet, simvastatin 5 mg tablet (Patient not taking: No sig reported), Disp: , Rfl:    Tart Cherry 1200 MG CAPS, Take by mouth., Disp: , Rfl:    TURMERIC PO, Take by mouth. 2 daily, Disp: , Rfl:    Zinc Sulfate (ZINC-220 PO), Take by mouth., Disp: , Rfl:   Social History   Tobacco Use  Smoking Status Never  Smokeless Tobacco Never  Allergies  Allergen Reactions   Duloxetine Other (See Comments)    Fatigue   Ezetimibe Other (See Comments)    Muscle aches; same side effect as with statins   Statins     Myalgias on simvastatin 20mg  daily, rosuvastatin 5mg  and 20mg  daily, atorvastatin 10mg  daily, and pitavastatin 1mg  daily   Fluticasone-Salmeterol Hives    REACTION: hives REACTION: hives   Scallops [Shellfish Allergy] Hives   Objective:  There were no vitals filed for this visit. There is no height or weight on file to calculate BMI. Constitutional Well developed. Well nourished.  Vascular Dorsalis pedis pulses palpable bilaterally. Posterior tibial pulses palpable bilaterally. Capillary refill normal to all digits.  No cyanosis or clubbing noted. Pedal hair growth normal.  Neurologic Normal speech. Oriented to person, place, and time. Epicritic sensation to light touch grossly present bilaterally.  Dermatologic Nails well groomed and normal in appearance. No open wounds. No skin  lesions.  Orthopedic: Pain on palpation to the left midfoot.  No pain with range of motion of the tarsometatarsal joints.  Negative piano key test.  Lisfranc interval clinically intact.   Radiographs: 3 views of skeletally mature adult left foot: Osteoarthritic changes noted to the midfoot mild to moderate in nature.  Lisfranc interval is intact.  No bony abnormalities identified.  Arthritis of the first metatarsophalangeal joint noted. Assessment:   1. Capsulitis of left foot    Plan:  Patient was evaluated and treated and all questions answered.  Left midfoot capsulitis with underlying arthritis - I explained to patient the etiology of capsulitis and various treatment options were discussed.  Given the amount of pain that she is having I believe she will benefit from a steroid injection help decrease acute inflammatory component associate with pain.  Patient agrees with plan to proceed with a steroid injection -A steroid injection was performed at left midfoot capsulitis using 1% plain Lidocaine and 10 mg of Kenalog. This was well tolerated. -Mobic was dispensed for pain control    No follow-ups on file.

## 2022-01-04 NOTE — Telephone Encounter (Signed)
Attempted to notify pt, will wait for a return call.

## 2022-01-27 ENCOUNTER — Ambulatory Visit: Payer: BC Managed Care – PPO | Admitting: Podiatry

## 2022-02-10 ENCOUNTER — Ambulatory Visit (INDEPENDENT_AMBULATORY_CARE_PROVIDER_SITE_OTHER): Payer: BC Managed Care – PPO | Admitting: Podiatry

## 2022-02-10 ENCOUNTER — Other Ambulatory Visit: Payer: Self-pay

## 2022-02-10 DIAGNOSIS — M778 Other enthesopathies, not elsewhere classified: Secondary | ICD-10-CM | POA: Diagnosis not present

## 2022-02-10 DIAGNOSIS — M7751 Other enthesopathy of right foot: Secondary | ICD-10-CM | POA: Diagnosis not present

## 2022-02-10 DIAGNOSIS — M7752 Other enthesopathy of left foot: Secondary | ICD-10-CM

## 2022-02-16 NOTE — Progress Notes (Signed)
Subjective:  Patient ID: Paula Bradley, female    DOB: 1961/12/09,  MRN: [id number]  Chief Complaint  Patient presents with   Foot Pain    Left foot pain  Pt stated that she is still having some pain     61 y.o. female presents with the above complaint.  Patient presents for follow-up to left midfoot pain.  She states the injection helped her pain is much better.  She would like to know if she needs another one.  She also has secondary complaint left first MPJ pain.  She denies any other acute complaints.   Review of Systems: Negative except as noted in the HPI. Denies N/V/F/Ch.  Past Medical History:  Diagnosis Date   Allergic rhinitis    Allergy    Anxiety    Anxiety and depression    Arthritis    OA   ASCUS of cervix with negative high risk HPV 09/2018, 10/2019   ECC Pap colposcopy showed LGSIL   Asthma    Cataract    removed both eyes   Depression    GERD (gastroesophageal reflux disease)    OCC- On Omeprazole as needed    Heart murmur    Hyperlipidemia    Kidney stone    Menopause     Current Outpatient Medications:    Ascorbic Acid (VITAMIN C) 1000 MG tablet, Take 1,000 mg by mouth daily., Disp: , Rfl:    Cholecalciferol (VITAMIN D) 2000 UNITS tablet, Take 2,000 Units by mouth daily., Disp: , Rfl:    clonazePAM (KLONOPIN) 1 MG tablet, Take 1 tablet (1 mg total) by mouth 2 (two) times daily., Disp: 60 tablet, Rfl: 2   Coenzyme Q10 200 MG capsule, Take 200 mg by mouth daily. , Disp: , Rfl:    GARLIC PO, Take by mouth., Disp: , Rfl:    levalbuterol (XOPENEX HFA) 45 MCG/ACT inhaler, Inhale 2 puffs into the lungs., Disp: , Rfl:    meloxicam (MOBIC) 15 MG tablet, Take 1 tablet (15 mg total) by mouth daily., Disp: 30 tablet, Rfl: 0   Multiple Vitamin (MULTIVITAMIN) tablet, Take 1 tablet by mouth daily., Disp: , Rfl:    Omega-3 Fatty Acids (FISH OIL) 1000 MG CAPS, Take by mouth., Disp: , Rfl:    omeprazole (PRILOSEC) 20 MG capsule, Take 20 mg by mouth daily., Disp:  , Rfl:    PARoxetine (PAXIL-CR) 25 MG 24 hr tablet, Take 25 mg by mouth daily., Disp: , Rfl:    Probiotic Product (PROBIOTIC PO), Take by mouth. Suprema Dophilus 5 billion 1 daily, Disp: , Rfl:    progesterone (PROMETRIUM) 100 MG capsule, Take 1 capsule (100 mg total) by mouth at bedtime. (Patient not taking: Reported on 02/04/2021), Disp: 30 capsule, Rfl: 4   simvastatin (ZOCOR) 5 MG tablet, simvastatin 5 mg tablet (Patient not taking: No sig reported), Disp: , Rfl:    Tart Cherry 1200 MG CAPS, Take by mouth., Disp: , Rfl:    TURMERIC PO, Take by mouth. 2 daily, Disp: , Rfl:    Zinc Sulfate (ZINC-220 PO), Take by mouth., Disp: , Rfl:   Social History   Tobacco Use  Smoking Status Never  Smokeless Tobacco Never    Allergies  Allergen Reactions   Duloxetine Other (See Comments)    Fatigue   Ezetimibe Other (See Comments)    Muscle aches; same side effect as with statins   Statins     Myalgias on simvastatin 20mg  daily, rosuvastatin 5mg  and 20mg  daily, atorvastatin  10mg  daily, and pitavastatin 1mg  daily   Fluticasone-Salmeterol Hives    REACTION: hives REACTION: hives   Scallops [Shellfish Allergy] Hives   Objective:  There were no vitals filed for this visit. There is no height or weight on file to calculate BMI. Constitutional Well developed. Well nourished.  Vascular Dorsalis pedis pulses palpable bilaterally. Posterior tibial pulses palpable bilaterally. Capillary refill normal to all digits.  No cyanosis or clubbing noted. Pedal hair growth normal.  Neurologic Normal speech. Oriented to person, place, and time. Epicritic sensation to light touch grossly present bilaterally.  Dermatologic Nails well groomed and normal in appearance. No open wounds. No skin lesions.  Orthopedic: Pain on palpation to the left midfoot.  No pain with range of motion of the tarsometatarsal joints.  Negative piano key test.  Lisfranc interval clinically intact.  Pain on palpation to the left  first MTPJ joint.  Pain on the medial aspect of the cancer.  Mild pain with range of motion.  Mild crepitus noted.  No other bony abnormalities identified.   Radiographs: 3 views of skeletally mature adult left foot: Osteoarthritic changes noted to the midfoot mild to moderate in nature.  Lisfranc interval is intact.  No bony abnormalities identified.  Arthritis of the first metatarsophalangeal joint noted. Assessment:   1. Capsulitis of left foot   2. Capsulitis of metatarsophalangeal (MTP) joint of left foot     Plan:  Patient was evaluated and treated and all questions answered.  Left midfoot capsulitis with underlying arthritis - I explained to patient the etiology of capsulitis and various treatment options were discussed.  Given the amount of pain that she is having I believe she will benefit from a steroid injection help decrease acute inflammatory component associate with pain.  Patient agrees with plan to proceed with a steroid injection -A second steroid injection was performed at left midfoot capsulitis using 1% plain Lidocaine and 10 mg of Kenalog. This was well tolerated. -Mobic was dispensed for pain control   Left first MTP capsulitis -I explained to patient the etiology of capsulitis and various treatment options were discussed.  Given the amount of pain that she is having I believe she will benefit from a steroid injection help decrease acute inflammatory component associate with pain.  Patient agrees with plan to proceed with a steroid injection -A  steroid injection was performed at left midfoot capsulitis using 1% plain Lidocaine and 10 mg of Kenalog. This was well tolerated.   No follow-ups on file.

## 2022-03-10 ENCOUNTER — Ambulatory Visit: Payer: BC Managed Care – PPO | Admitting: Podiatry

## 2022-04-07 ENCOUNTER — Ambulatory Visit (INDEPENDENT_AMBULATORY_CARE_PROVIDER_SITE_OTHER): Payer: BC Managed Care – PPO | Admitting: Podiatry

## 2022-04-07 ENCOUNTER — Encounter: Payer: Self-pay | Admitting: Podiatry

## 2022-04-07 DIAGNOSIS — M722 Plantar fascial fibromatosis: Secondary | ICD-10-CM | POA: Diagnosis not present

## 2022-04-07 NOTE — Progress Notes (Signed)
?Subjective:  ?Patient ID: Paula Bradley, female    DOB: 02-14-1961,  MRN: [id number] ? ?Chief Complaint  ?Patient presents with  ? Foot Pain  ? ? ?61 y.o. female presents with the above complaint.  Patient presents for follow-up to left midfoot and first MPJ pain.  She states she is doing a lot better no further pain there.  She has a secondary complaint of bilateral Planter fasciitis at the midfoot.  She states it hurts with ambulation has progressive gotten worse.  She does Pilates.  She denies any other acute complaints she has not seen anyone else prior to seeing me.   ? ? ?Review of Systems: Negative except as noted in the HPI. Denies N/V/F/Ch. ? ?Past Medical History:  ?Diagnosis Date  ? Allergic rhinitis   ? Allergy   ? Anxiety   ? Anxiety and depression   ? Arthritis   ? OA  ? ASCUS of cervix with negative high risk HPV 09/2018, 10/2019  ? ECC Pap colposcopy showed LGSIL  ? Asthma   ? Cataract   ? removed both eyes  ? Depression   ? GERD (gastroesophageal reflux disease)   ? OCC- On Omeprazole as needed   ? Heart murmur   ? Hyperlipidemia   ? Kidney stone   ? Menopause   ? ? ?Current Outpatient Medications:  ?  Ascorbic Acid (VITAMIN C) 1000 MG tablet, Take 1,000 mg by mouth daily., Disp: , Rfl:  ?  Cholecalciferol (VITAMIN D) 2000 UNITS tablet, Take 2,000 Units by mouth daily., Disp: , Rfl:  ?  clonazePAM (KLONOPIN) 1 MG tablet, Take 1 tablet (1 mg total) by mouth 2 (two) times daily., Disp: 60 tablet, Rfl: 2 ?  Coenzyme Q10 200 MG capsule, Take 200 mg by mouth daily. , Disp: , Rfl:  ?  GARLIC PO, Take by mouth., Disp: , Rfl:  ?  levalbuterol (XOPENEX HFA) 45 MCG/ACT inhaler, Inhale 2 puffs into the lungs., Disp: , Rfl:  ?  meloxicam (MOBIC) 15 MG tablet, Take 1 tablet (15 mg total) by mouth daily., Disp: 30 tablet, Rfl: 0 ?  Multiple Vitamin (MULTIVITAMIN) tablet, Take 1 tablet by mouth daily., Disp: , Rfl:  ?  Omega-3 Fatty Acids (FISH OIL) 1000 MG CAPS, Take by mouth., Disp: , Rfl:  ?  omeprazole  (PRILOSEC) 20 MG capsule, Take 20 mg by mouth daily., Disp: , Rfl:  ?  PARoxetine (PAXIL-CR) 25 MG 24 hr tablet, Take 25 mg by mouth daily., Disp: , Rfl:  ?  Probiotic Product (PROBIOTIC PO), Take by mouth. Suprema Dophilus 5 billion 1 daily, Disp: , Rfl:  ?  progesterone (PROMETRIUM) 100 MG capsule, Take 1 capsule (100 mg total) by mouth at bedtime. (Patient not taking: Reported on 02/04/2021), Disp: 30 capsule, Rfl: 4 ?  simvastatin (ZOCOR) 5 MG tablet, simvastatin 5 mg tablet (Patient not taking: No sig reported), Disp: , Rfl:  ?  Tart Cherry 1200 MG CAPS, Take by mouth., Disp: , Rfl:  ?  TURMERIC PO, Take by mouth. 2 daily, Disp: , Rfl:  ?  Zinc Sulfate (ZINC-220 PO), Take by mouth., Disp: , Rfl:  ? ?Social History  ? ?Tobacco Use  ?Smoking Status Never  ?Smokeless Tobacco Never  ? ? ?Allergies  ?Allergen Reactions  ? Duloxetine Other (See Comments)  ?  Fatigue  ? Ezetimibe Other (See Comments)  ?  Muscle aches; same side effect as with statins  ? Statins   ?  Myalgias on simvastatin 20mg   daily, rosuvastatin 5mg  and 20mg  daily, atorvastatin 10mg  daily, and pitavastatin 1mg  daily  ? Fluticasone-Salmeterol Hives  ?  REACTION: hives ?REACTION: hives  ? Scallops [Shellfish Allergy] Hives  ? ?Objective:  ?There were no vitals filed for this visit. ?There is no height or weight on file to calculate BMI. ?Constitutional Well developed. ?Well nourished.  ?Vascular Dorsalis pedis pulses palpable bilaterally. ?Posterior tibial pulses palpable bilaterally. ?Capillary refill normal to all digits.  ?No cyanosis or clubbing noted. ?Pedal hair growth normal.  ?Neurologic Normal speech. ?Oriented to person, place, and time. ?Epicritic sensation to light touch grossly present bilaterally.  ?Dermatologic Nails well groomed and normal in appearance. ?No open wounds. ?No skin lesions.  ?Orthopedic: No further pain on palpation to the left midfoot.  No pain with range of motion of the tarsometatarsal joints.  Negative piano key test.   Lisfranc interval clinically intact.  No further pain on palpation to the left first MTPJ joint.  No further pain on the medial aspect of the cancer.  Mild pain with range of motion.  Mild crepitus noted.  No other bony abnormalities identified. ? ? ?Pain on palpation to the central band of the plantar fascia at the midfoot.  Tight/taut plantar fascia noted.  No pain at the calcaneal tuber.  Findings consistent with plantar fasciitis  ? ?Radiographs: 3 views of skeletally mature adult left foot: Osteoarthritic changes noted to the midfoot mild to moderate in nature.  Lisfranc interval is intact.  No bony abnormalities identified.  Arthritis of the first metatarsophalangeal joint noted. ?Assessment:  ? ?1. Plantar fasciitis of right foot   ?2. Plantar fasciitis of left foot   ? ? ? ?Plan:  ?Patient was evaluated and treated and all questions answered. ? ?Bilateral Planter fasciitis at the midfoot ?-I explained the patient the etiology of Planter fasciitis and various treatment options were extensively discussed.  Given the amount of pain that she is experiencing I believe she will benefit from plantar fascial brace to help support the arch.  I discussed with her the importance of shoe gear modification as well as stretching.  I will hold off on steroid injection for now however if she is not having any relief we will discuss steroid injection during next clinical visit as well as orthotics ? ?Left midfoot capsulitis with underlying arthritis ?-Clinically healed and doing well ? ?Left first MTP capsulitis ?-Clinically healed and doing well ? ? ?No follow-ups on file.  ?

## 2022-05-05 ENCOUNTER — Ambulatory Visit (INDEPENDENT_AMBULATORY_CARE_PROVIDER_SITE_OTHER): Payer: BC Managed Care – PPO | Admitting: Podiatry

## 2022-06-02 ENCOUNTER — Ambulatory Visit (INDEPENDENT_AMBULATORY_CARE_PROVIDER_SITE_OTHER): Payer: BC Managed Care – PPO | Admitting: Family Medicine

## 2022-06-02 ENCOUNTER — Encounter: Payer: Self-pay | Admitting: Family Medicine

## 2022-06-02 DIAGNOSIS — M79672 Pain in left foot: Secondary | ICD-10-CM | POA: Diagnosis not present

## 2022-06-02 NOTE — Patient Instructions (Signed)
Your pain is due to arthritis at the base of your big toe and your midfoot. 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 These are the different medications you can take for arthritis: Tylenol 500mg  1-2 tabs three times a day for pain. Capsaicin, aspercreme, voltaren gel, or biofreeze topically up to four times a day may also help with pain. Some supplements that may help for arthritis: Boswellia extract, curcumin, pycnogenol; I would start back on your turmeric. Aleve 1-2 tabs twice a day with food if needed for pain and inflammation. Cortisone injections are an option but only help with short term pain relief. It's important that you continue to stay active. Shoe inserts with good arch support are helpful with first ray posts - move these into different shoes that you wear. Heat or ice 15 minutes at a time 3-4 times a day as needed to help with pain. Water aerobics and cycling with low resistance are the best two types of exercise for arthritis though any exercise is ok as long as it doesn't worsen the pain. Follow up with me in 1 month.

## 2022-06-02 NOTE — Progress Notes (Signed)
PCP: Windy Canny, MD  Subjective:   HPI: Patient is a 61 y.o. female here for left foot pain.  Patient reports several months+ of left foot pain. Pain she is currently struggling with is on dorsal foot about mid-forefoot junction. Also pain dorsally at base of great toe. Difficulty walking, doing pilates due to pain in these areas. She has noticed knots plantar left foot but denies pain in this area. Has seen podiatry and had a couple steroid injections midfoot, one at base of 1st MTP - minimal relief.  Past Medical History:  Diagnosis Date   Allergic rhinitis    Allergy    Anxiety    Anxiety and depression    Arthritis    OA   ASCUS of cervix with negative high risk HPV 09/2018, 10/2019   ECC Pap colposcopy showed LGSIL   Asthma    Cataract    removed both eyes   Depression    GERD (gastroesophageal reflux disease)    OCC- On Omeprazole as needed    Heart murmur    Hyperlipidemia    Kidney stone    Menopause     Current Outpatient Medications on File Prior to Visit  Medication Sig Dispense Refill   Ascorbic Acid (VITAMIN C) 1000 MG tablet Take 1,000 mg by mouth daily.     Cholecalciferol (VITAMIN D) 2000 UNITS tablet Take 2,000 Units by mouth daily.     clonazePAM (KLONOPIN) 1 MG tablet Take 1 tablet (1 mg total) by mouth 2 (two) times daily. 60 tablet 2   Coenzyme Q10 200 MG capsule Take 200 mg by mouth daily.      GARLIC PO Take by mouth.     levalbuterol (XOPENEX HFA) 45 MCG/ACT inhaler Inhale 2 puffs into the lungs.     meloxicam (MOBIC) 15 MG tablet Take 1 tablet (15 mg total) by mouth daily. 30 tablet 0   Multiple Vitamin (MULTIVITAMIN) tablet Take 1 tablet by mouth daily.     Omega-3 Fatty Acids (FISH OIL) 1000 MG CAPS Take by mouth.     omeprazole (PRILOSEC) 20 MG capsule Take 20 mg by mouth daily.     PARoxetine (PAXIL-CR) 25 MG 24 hr tablet Take 25 mg by mouth daily.     Probiotic Product (PROBIOTIC PO) Take by mouth. Suprema Dophilus 5 billion 1 daily      progesterone (PROMETRIUM) 100 MG capsule Take 1 capsule (100 mg total) by mouth at bedtime. (Patient not taking: Reported on 02/04/2021) 30 capsule 4   simvastatin (ZOCOR) 5 MG tablet simvastatin 5 mg tablet (Patient not taking: No sig reported)     Tart Cherry 1200 MG CAPS Take by mouth.     TURMERIC PO Take by mouth. 2 daily     Zinc Sulfate (ZINC-220 PO) Take by mouth.     No current facility-administered medications on file prior to visit.    Past Surgical History:  Procedure Laterality Date   CESAREAN SECTION  1996   COLONOSCOPY  2011   benign colonic mucosa    COLPOSCOPY     DILATION AND CURETTAGE OF UTERUS     TONSILLECTOMY  1985    Allergies  Allergen Reactions   Duloxetine Other (See Comments)    Fatigue   Ezetimibe Other (See Comments)    Muscle aches; same side effect as with statins   Statins     Myalgias on simvastatin 20mg  daily, rosuvastatin 5mg  and 20mg  daily, atorvastatin 10mg  daily, and pitavastatin 1mg  daily  Fluticasone-Salmeterol Hives    REACTION: hives REACTION: hives   Scallops [Shellfish Allergy] Hives    BP 132/70   Ht 5\' 5"  (1.651 m)   Wt 190 lb (86.2 kg)   BMI 31.62 kg/m       View : No data to display.              View : No data to display.              Objective:  Physical Exam:  Gen: NAD, comfortable in exam room  Left foot/ankle: Transverse arch collapse with small bunionette, rotation of 4th and 5th digits.  Mod long arch collapse.  Plantar fibroma palpated but nontender.  No other gross deformity, swelling, ecchymoses FROM ankle without pain.  Hallux rigidus. TTP 1st MTP and mid TMT joint. Negative ant drawer and negative talar tilt.   Negative syndesmotic compression. Thompsons test negative. NV intact distally.   Limited MSK u/s left foot: Spurring 1st MTP and TMT joint greatest at base 3rd MT.  Assessment & Plan:  1. Left foot pain - 2/2 TMT arthritis and 1st MTP arthritis. Sports insoles provided with  1st ray posts.  Tylenol, topical medications, supplements, aleve reviewed.  Heat or ice.  Exercise as tolerated.  F/u in 1 month

## 2022-07-07 ENCOUNTER — Ambulatory Visit: Payer: BC Managed Care – PPO | Admitting: Family Medicine

## 2023-11-07 ENCOUNTER — Ambulatory Visit: Payer: BC Managed Care – PPO | Attending: Internal Medicine | Admitting: Physical Therapy

## 2023-11-07 ENCOUNTER — Encounter: Payer: Self-pay | Admitting: Physical Therapy

## 2023-11-07 DIAGNOSIS — R262 Difficulty in walking, not elsewhere classified: Secondary | ICD-10-CM | POA: Insufficient documentation

## 2023-11-07 DIAGNOSIS — M6281 Muscle weakness (generalized): Secondary | ICD-10-CM | POA: Insufficient documentation

## 2023-11-07 DIAGNOSIS — M5459 Other low back pain: Secondary | ICD-10-CM | POA: Diagnosis present

## 2023-11-07 DIAGNOSIS — M25561 Pain in right knee: Secondary | ICD-10-CM | POA: Insufficient documentation

## 2023-11-07 DIAGNOSIS — G8929 Other chronic pain: Secondary | ICD-10-CM | POA: Insufficient documentation

## 2023-11-07 NOTE — Therapy (Signed)
OUTPATIENT PHYSICAL THERAPY LOWER EXTREMITY EVALUATION   Patient Name: Paula Bradley MRN: [id number] DOB:[birth year], 62 y.o., female Today's Date: 11/07/2023  END OF SESSION:  PT End of Session - 11/07/23 1526     Visit Number 1    Date for PT Re-Evaluation 01/07/24    Authorization Type [employer]    PT Start Time 1525    PT Stop Time 1615    PT Time Calculation (min) 50 min    Activity Tolerance Patient tolerated treatment well    Behavior During Therapy WFL for tasks assessed/performed             Past Medical History:  Diagnosis Date   Allergic rhinitis    Allergy    Anxiety    Anxiety and depression    Arthritis    OA   ASCUS of cervix with negative high risk HPV 09/2018, 10/2019   ECC Pap colposcopy showed LGSIL   Asthma    Cataract    removed both eyes   Depression    GERD (gastroesophageal reflux disease)    OCC- On Omeprazole as needed    Heart murmur    Hyperlipidemia    Kidney stone    Menopause    Past Surgical History:  Procedure Laterality Date   CESAREAN SECTION  1996   COLONOSCOPY  2011   benign colonic mucosa    COLPOSCOPY     DILATION AND CURETTAGE OF UTERUS     TONSILLECTOMY  1985   Patient Active Problem List   Diagnosis Date Noted   Asthma, not well controlled, moderate persistent, with acute exacerbation 01/07/2020   COVID-19 01/07/2020   Bronchitis 08/22/2018   Kidney stone 08/10/2018   GERD (gastroesophageal reflux disease) 01/21/2015   Allergic rhinitis 01/21/2015   Hyperlipidemia 09/22/2014   Obesity 01/04/2012   Asthma 11/29/2011   Anxiety 11/29/2011    PCP: Daisey Must, MD  REFERRING PROVIDER: Daisey Must, MD  REFERRING DIAG: right knee pain  THERAPY DIAG:  Chronic pain of right knee  Muscle weakness (generalized)  Difficulty in walking, not elsewhere classified  Rationale for Evaluation and Treatment: Rehabilitation  ONSET DATE: 10/19/23  SUBJECTIVE:   SUBJECTIVE STATEMENT: Patient reports that she has had  some right knee pain for many years.  She does reports back pain, x-rays showed some OA.  She recently switched back to [employer], she said she tried [employer] and it really caused more pain  PERTINENT HISTORY: See above PAIN:  Are you having pain? Yes: NPRS scale: 0/10 Pain location: right knee , right hip and in the low back Pain description: ache Aggravating factors: worse with [place] shoes, difficulty sleeping, pain up to 10/10 Relieving factors: Brooks shoes, chiropractor all help  PRECAUTIONS: None  RED FLAGS: None   WEIGHT BEARING RESTRICTIONS: No  FALLS:  Has patient fallen in last 6 months? 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  DIAGNOSTIC FINDINGS: x-rays mild OA  COGNITION: Overall cognitive status: Within functional limits for tasks assessed     SENSATION: WFL MUSCLE LENGTH: Hamstrings: Right 60 deg; Left  60 deg Very tight ITB, piriformis, hip flexor and quad  POSTURE: rounded shoulders and forward head  PALPATION: Tender and very tight in the low back, buttocks, SI, right ITB and medial knee  ROM of the Lumbar spine:  limited 50% with tightness in the low back  LOWER EXTREMITY ROM:  WFL's but very tight in the mms  LOWER EXTREMITY MMT:  MMT Right eval Left eval  Hip flexion 3+ 4-  Hip extension 4- 4-  Hip abduction 3+ 4-  Hip adduction    Hip internal rotation    Hip external rotation    Knee flexion 4 4+  Knee extension 4- 4+  Ankle dorsiflexion    Ankle plantarflexion    Ankle inversion     Ankle eversion     (Blank rows = not tested)  LOWER EXTREMITY SPECIAL TESTS:  Knee special tests: Lachman Test: negative, McMurray's test: negative, and Pivot shift test: positive   FUNCTIONAL TESTS:  5 times sit to stand: 23 seconds Timed up and go (TUG): 16 seconds  GAIT: Distance walked: 50 feet Assistive device utilized: None Level of assistance: Complete Independence Comments: mild antalgic on the right   TODAY'S TREATMENT:                                                                                                                              DATE:  11/07/23 = Eval and establish HEP    PATIENT EDUCATION:  Education details: HEP/POC Person educated: Patient Education method: Programmer, multimedia, Facilities manager, Verbal cues, and Handouts Education comprehension: verbalized understanding  HOME EXERCISE PROGRAM: Access Code: [email or web address] URL: [email or web address] Date: 11/07/2023 Prepared by: Stacie Glaze  Exercises - Supine Lower Trunk Rotation  - 1 x daily - 7 x weekly - 1 sets - 10 reps - 5 hold - Hooklying Single Knee to Chest Stretch  - 1 x daily - 7 x weekly - 1 sets - 10 reps - 5 hold - Supine Pelvic Tilt  - 1 x daily - 7 x weekly - 1 sets - 10 reps - 5 hold - Supine Piriformis Stretch Pulling Heel to Hip  - 1 x daily - 7 x weekly - 1 sets - 10 reps - 20 hold - Seated Hamstring Stretch with Chair  - 1 x daily - 7 x weekly - 1 sets - 10 reps - 30 hold - Supine Quadriceps Stretch with Strap on Table  - 1 x daily - 7 x weekly - 1 sets - 10 reps - 20 hold  ASSESSMENT:  CLINICAL IMPRESSION: Patient is a 62 y.o. female who was seen today for physical therapy evaluation and treatment for right knee pain and back pain. She has a hx of back and knee problems, x-rays show some mild OA changes.  She has a mild limp, she is very tender in the SI areas, the right buttock, right ITB and the quad.  She is very tight in the adductors, quads, ITB,  HS and piriformis  mms, her hips are very weak, her core is weak.  OBJECTIVE IMPAIRMENTS: Abnormal gait, cardiopulmonary status limiting activity, decreased activity tolerance, decreased balance, decreased endurance, decreased mobility, difficulty walking, decreased ROM, decreased strength, increased edema, increased muscle spasms, impaired flexibility, improper body mechanics, postural dysfunction, and pain.   REHAB POTENTIAL: Good  CLINICAL DECISION MAKING: Stable/uncomplicated  EVALUATION COMPLEXITY: Low   GOALS: Goals reviewed with patient? Yes  SHORT TERM GOALS: Target date: 11/21/23 Independent with initial HEP Goal status: INITIAL  LONG TERM GOALS: Target date: 01/28/24  Independent with advanced HEP Goal status: INITIAL  2.  Reports right knee pain decreased 50% Goal status: INITIAL  3.  Increase SLR to 75 degrees Goal status: INITIAL  4.  Increase strength of the hips to 4/5 Goal status: INITIAL  5.  Decrease TUG time to 12 seconds Goal status: INITIAL  PLAN:  PT FREQUENCY: 1x/week  PT DURATION: 12 weeks  PLANNED INTERVENTIONS: 97164- PT Re-evaluation, 97110-Therapeutic exercises, 97530- Therapeutic activity, [id number]- Neuromuscular re-education, 97535- Self Care, 16109- Manual therapy, [id number]- Gait training, [phone]- Electrical stimulation (manual), [id number]- Vasopneumatic device, [id number]- Ultrasound, 09811- Ionotophoresis 4mg /ml Dexamethasone, Patient/Family education, Stair training, Taping, Dry Needling, Joint mobilization, Cryotherapy, and Moist heat  PLAN FOR NEXT SESSION: assure HEP, start gym, hip and cores strength and work on flexibility   [employer], PT 11/07/2023, 3:27 PM

## 2023-11-15 ENCOUNTER — Ambulatory Visit: Payer: BC Managed Care – PPO | Admitting: Physical Therapy

## 2023-11-17 ENCOUNTER — Encounter: Payer: Self-pay | Admitting: Physical Therapy

## 2023-11-17 ENCOUNTER — Ambulatory Visit: Payer: BC Managed Care – PPO | Admitting: Physical Therapy

## 2023-11-17 DIAGNOSIS — M25561 Pain in right knee: Secondary | ICD-10-CM | POA: Diagnosis not present

## 2023-11-17 DIAGNOSIS — M5459 Other low back pain: Secondary | ICD-10-CM

## 2023-11-17 DIAGNOSIS — M6281 Muscle weakness (generalized): Secondary | ICD-10-CM

## 2023-11-17 DIAGNOSIS — G8929 Other chronic pain: Secondary | ICD-10-CM

## 2023-11-17 DIAGNOSIS — R262 Difficulty in walking, not elsewhere classified: Secondary | ICD-10-CM

## 2023-11-17 NOTE — Therapy (Signed)
OUTPATIENT PHYSICAL THERAPY LOWER EXTREMITY TREATMENT   Patient Name: Paula Bradley MRN: [id number] DOB:[birth year], 62 y.o., female Today's Date: 11/17/2023  END OF SESSION:  PT End of Session - 11/17/23 1513     Visit Number 2    Date for PT Re-Evaluation 01/07/24    PT Start Time 1515    PT Stop Time 1600    PT Time Calculation (min) 45 min    Activity Tolerance Patient tolerated treatment well    Behavior During Therapy [facility] for tasks assessed/performed             Past Medical History:  Diagnosis Date   Allergic rhinitis    Allergy    Anxiety    Anxiety and depression    Arthritis    OA   ASCUS of cervix with negative high risk HPV 09/2018, 10/2019   ECC Pap colposcopy showed LGSIL   Asthma    Cataract    removed both eyes   Depression    GERD (gastroesophageal reflux disease)    OCC- On Omeprazole as needed    Heart murmur    Hyperlipidemia    Kidney stone    Menopause    Past Surgical History:  Procedure Laterality Date   CESAREAN SECTION  1996   COLONOSCOPY  2011   benign colonic mucosa    COLPOSCOPY     DILATION AND CURETTAGE OF UTERUS     TONSILLECTOMY  1985   Patient Active Problem List   Diagnosis Date Noted   Asthma, not well controlled, moderate persistent, with acute exacerbation 01/07/2020   COVID-19 01/07/2020   Bronchitis 08/22/2018   Kidney stone 08/10/2018   GERD (gastroesophageal reflux disease) 01/21/2015   Allergic rhinitis 01/21/2015   Hyperlipidemia 09/22/2014   Obesity 01/04/2012   Asthma 11/29/2011   Anxiety 11/29/2011    PCP: Daisey Must, MD  REFERRING PROVIDER: Daisey Must, MD  REFERRING DIAG: right knee pain  THERAPY DIAG:  Chronic pain of right knee  Muscle weakness (generalized)  Difficulty in walking, not elsewhere classified  Other low back pain  Rationale for Evaluation and Treatment: Rehabilitation  ONSET DATE: 10/19/23  SUBJECTIVE:   SUBJECTIVE STATEMENT: I doing better, changed shoes,  stretching, training with her trainer  PERTINENT HISTORY: See above PAIN:  Are you having pain? Yes: NPRS scale: 0/10 Pain location: right knee , right hip and in the low back Pain description: ache Aggravating factors: worse with Hoka shoes, difficulty sleeping, pain up to 10/10 Relieving factors: Brooks shoes, chiropractor all help  PRECAUTIONS: None  RED FLAGS: None   WEIGHT BEARING RESTRICTIONS: No  FALLS:  Has patient fallen in last 6 months? 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  DIAGNOSTIC FINDINGS: x-rays mild OA  COGNITION: Overall cognitive status: Within functional limits for tasks assessed     SENSATION: WFL MUSCLE LENGTH: Hamstrings: Right 60 deg; Left 60 deg Very tight ITB, piriformis, hip flexor and quad  POSTURE: rounded shoulders and forward head  PALPATION: Tender and very tight in the low back, buttocks, SI, right ITB and medial  knee  ROM of the Lumbar spine:  limited 50% with tightness in the low back  LOWER EXTREMITY ROM:  WFL's but very tight in the mms  LOWER EXTREMITY MMT:  MMT Right eval Left eval  Hip flexion 3+ 4-  Hip extension 4- 4-  Hip abduction 3+ 4-  Hip adduction    Hip internal rotation    Hip external rotation    Knee flexion 4 4+  Knee extension 4- 4+  Ankle dorsiflexion    Ankle plantarflexion    Ankle inversion    Ankle eversion     (Blank rows = not tested)  LOWER EXTREMITY SPECIAL TESTS:  Knee special tests: Lachman Test: negative, McMurray's test:  negative, and Pivot shift test: positive   FUNCTIONAL TESTS:  5 times sit to stand: 23 seconds Timed up and go (TUG): 16 seconds  GAIT: Distance walked: 50 feet Assistive device utilized: None Level of assistance: Complete Independence Comments: mild antalgic on the right   TODAY'S TREATMENT:                                                                                                                              DATE:  11/17/23 NuStep L 5 x 6 min  S2S holding yellow ball 2x10  LAQ RLE 5lb 2x10 HS curls blue 2x10 Forward & Lateral step ups 6in x 10 each  Heel raises 2x15 Shoulder Ext 10lb 2x10 Resisted gait forward and backwards  LLE eccentric step downs  11/07/23 = Eval and establish HEP    PATIENT EDUCATION:  Education details: HEP/POC Person educated: Patient Education method: Programmer, multimedia, Facilities manager, Verbal cues, and Handouts Education comprehension: verbalized understanding  HOME EXERCISE PROGRAM: Access Code: [email or web address] URL: [email or web address] Date: 11/07/2023 Prepared by: Stacie Glaze  Exercises - Supine Lower Trunk Rotation  - 1 x daily - 7 x weekly - 1 sets - 10 reps - 5 hold - Hooklying Single Knee to Chest Stretch  - 1 x daily - 7 x weekly - 1 sets - 10 reps - 5 hold - Supine Pelvic Tilt  - 1 x daily - 7 x weekly - 1 sets - 10 reps - 5 hold - Supine Piriformis Stretch Pulling Heel to Hip  - 1 x daily - 7 x weekly - 1 sets - 10 reps - 20 hold - Seated Hamstring Stretch with Chair  - 1 x daily - 7 x weekly - 1 sets - 10 reps - 30 hold - Supine Quadriceps Stretch with Strap on Table  - 1 x daily - 7 x weekly - 1 sets - 10 reps - 20 hold  ASSESSMENT:  CLINICAL IMPRESSION: Patient is a 62 y.o. female who was seen today for physical therapy treatment for right knee pain and back pain. She has a hx of back and knee problems, x-rays show some mild OA changes.  Session consisted of LE strength with the use of isolated and functional  strength. No increase  in pain but did report she could feel it. Cues to increase ROM with HS curls. Good effort during session.  OBJECTIVE IMPAIRMENTS: Abnormal gait, cardiopulmonary status limiting activity, decreased activity tolerance, decreased balance, decreased endurance, decreased mobility, difficulty walking, decreased ROM, decreased strength, increased edema, increased muscle spasms, impaired flexibility, improper body mechanics, postural dysfunction, and pain.   REHAB POTENTIAL: Good  CLINICAL DECISION MAKING: Stable/uncomplicated  EVALUATION COMPLEXITY: Low   GOALS: Goals reviewed with patient? Yes  SHORT TERM GOALS: Target date: 11/21/23 Independent with initial HEP Goal status: Met 11/08/23  LONG TERM GOALS: Target date: 01/28/24  Independent with advanced HEP Goal status: INITIAL  2.  Reports right knee pain decreased 50% Goal status: INITIAL  3.  Increase SLR to 75 degrees Goal status: INITIAL  4.  Increase strength of the hips to 4/5 Goal status: INITIAL  5.  Decrease TUG time to 12 seconds Goal status: INITIAL  PLAN:  PT FREQUENCY: 1x/week  PT DURATION: 12 weeks  PLANNED INTERVENTIONS: 97164- PT Re-evaluation, 97110-Therapeutic exercises, 97530- Therapeutic activity, [id number]- Neuromuscular re-education, 97535- Self Care, 91478- Manual therapy, [id number]- Gait training, [phone]- Electrical stimulation (manual), [id number]- Vasopneumatic device, [id number]- Ultrasound, 13086- Ionotophoresis 4mg /ml Dexamethasone, Patient/Family education, Stair training, Taping, Dry Needling, Joint mobilization, Cryotherapy, and Moist heat  PLAN FOR NEXT SESSION: assure HEP, start gym, hip and cores strength and work on flexibility   Grayce Sessions, PTA 11/17/2023, 3:13 PM

## 2023-11-22 ENCOUNTER — Encounter: Payer: Self-pay | Admitting: Physical Therapy

## 2023-11-22 ENCOUNTER — Ambulatory Visit: Payer: BC Managed Care – PPO | Admitting: Physical Therapy

## 2023-11-22 DIAGNOSIS — M25561 Pain in right knee: Secondary | ICD-10-CM | POA: Diagnosis not present

## 2023-11-22 DIAGNOSIS — M6281 Muscle weakness (generalized): Secondary | ICD-10-CM

## 2023-11-22 DIAGNOSIS — R262 Difficulty in walking, not elsewhere classified: Secondary | ICD-10-CM

## 2023-11-22 DIAGNOSIS — M5459 Other low back pain: Secondary | ICD-10-CM

## 2023-11-22 NOTE — Therapy (Signed)
OUTPATIENT PHYSICAL THERAPY LOWER EXTREMITY TREATMENT   Patient Name: Paula Bradley MRN: [id number] DOB:December 15, 1961, 62 y.o., female Today's Date: 11/22/2023  END OF SESSION:  PT End of Session - 11/22/23 1531     Visit Number 3    Date for PT Re-Evaluation 01/07/24    Authorization Type BCBS    PT Start Time 1531    PT Stop Time 1614    PT Time Calculation (min) 43 min    Activity Tolerance Patient tolerated treatment well    Behavior During Therapy WFL for tasks assessed/performed              Past Medical History:  Diagnosis Date   Allergic rhinitis    Allergy    Anxiety    Anxiety and depression    Arthritis    OA   ASCUS of cervix with negative high risk HPV 09/2018, 10/2019   ECC Pap colposcopy showed LGSIL   Asthma    Cataract    removed both eyes   Depression    GERD (gastroesophageal reflux disease)    OCC- On Omeprazole as needed    Heart murmur    Hyperlipidemia    Kidney stone    Menopause    Past Surgical History:  Procedure Laterality Date   CESAREAN SECTION  1996   COLONOSCOPY  2011   benign colonic mucosa    COLPOSCOPY     DILATION AND CURETTAGE OF UTERUS     TONSILLECTOMY  1985   Patient Active Problem List   Diagnosis Date Noted   Asthma, not well controlled, moderate persistent, with acute exacerbation 01/07/2020   COVID-19 01/07/2020   Bronchitis 08/22/2018   Kidney stone 08/10/2018   GERD (gastroesophageal reflux disease) 01/21/2015   Allergic rhinitis 01/21/2015   Hyperlipidemia 09/22/2014   Obesity 01/04/2012   Asthma 11/29/2011   Anxiety 11/29/2011    PCP: Daisey Must, MD  REFERRING PROVIDER: Daisey Must, MD  REFERRING DIAG: right knee pain  THERAPY DIAG:  Chronic pain of right knee  Muscle weakness (generalized)  Difficulty in walking, not elsewhere classified  Other low back pain  Rationale for Evaluation and Treatment: Rehabilitation  ONSET DATE: 10/19/23  SUBJECTIVE:   SUBJECTIVE STATEMENT:  I'm  back working with my trainer now, I'm feeling really well now. I changed shoes too. I feel the Hokas led to this, I never wore them before, I bought new Hokas and did a lot of walking in those and that seems to have caused the problem. I went to the chiropracter this morning because my hip was bothering me, better now.   PERTINENT HISTORY: See above PAIN:  Are you having pain? Yes: NPRS scale: 0/10 Pain location: right knee , right hip and in the low back Pain description: ache Aggravating factors: worse with Hoka shoes, difficulty sleeping, pain up to 10/10 Relieving factors: Brooks shoes, chiropractor all help  PRECAUTIONS: None  RED FLAGS: None   WEIGHT BEARING RESTRICTIONS: No  FALLS:  Has patient fallen in last 6 months? 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  DIAGNOSTIC FINDINGS: x-rays mild OA  COGNITION: Overall cognitive status: Within functional limits for tasks assessed     SENSATION: WFL MUSCLE LENGTH: Hamstrings: Right 60 deg; Left 60 deg Very tight ITB, piriformis, hip flexor and quad  POSTURE: rounded shoulders and forward head  PALPATION: Tender and very tight in the low back, buttocks, SI, right ITB and medial knee  ROM of the Lumbar spine:  limited 50% with tightness in the low back  LOWER EXTREMITY ROM:  WFL's but very tight in the mms  LOWER EXTREMITY MMT:  MMT Right eval Left eval  Hip flexion 3+ 4-  Hip extension 4- 4-  Hip abduction 3+ 4-  Hip adduction    Hip internal rotation    Hip  external rotation    Knee flexion 4 4+  Knee extension 4- 4+  Ankle dorsiflexion    Ankle plantarflexion    Ankle inversion    Ankle eversion     (Blank rows = not tested)  LOWER EXTREMITY SPECIAL TESTS:  Knee special tests: Lachman Test: negative, McMurray's test: negative, and Pivot shift test: positive   FUNCTIONAL TESTS:  5 times sit to stand: 23 seconds Timed up and go (TUG): 16 seconds  GAIT: Distance walked: 50 feet Assistive device utilized: None Level of assistance: Complete Independence Comments: mild antalgic on the right   TODAY'S TREATMENT:                                                                                                                              DATE:    11/22/23  TherEx  Nustep L5 x6 minutes BLEs only  Hip flexor stretch 2x30 seconds B  Supine figure 4 stretch 2x30 seconds B QL stretch 3 ways with stool 30 seconds each  Alternate version of QL stretch 1x30 seconds b ITB stretch on wall 1x15 seconds B Demonstrated sidelying ITB stretch on the bed Single leg bridge x10 B Sidelying hip ABD red TB x10 B PPT 15x3 second holds  PPT + march x10  PPT + dead bug x10     28-Nov-2023 NuStep L 5 x 6 min  S2S holding yellow ball 2x10  LAQ RLE 5lb 2x10 HS curls blue 2x10 Forward & Lateral step ups 6in x 10 each  Heel raises 2x15 Shoulder Ext 10lb 2x10 Resisted gait forward and backwards  LLE eccentric step downs  11/07/23 = Eval and establish HEP    PATIENT EDUCATION:  Education details: HEP/POC Person educated: Patient Education method: Programmer, multimedia, Facilities manager, Verbal cues, and Handouts Education comprehension: verbalized understanding  HOME EXERCISE PROGRAM:  Access Code: [email or web address] URL: [email or web address] Date: 11/22/2023 Prepared by: Nedra Hai  Exercises - Supine Lower Trunk Rotation  - 1 x daily - 7 x weekly - 1 sets - 10 reps - 5 hold - Hooklying Single Knee to Chest Stretch  - 1 x daily - 7 x weekly  - 1 sets - 10 reps - 5 hold - Supine  Pelvic Tilt  - 1 x daily - 7 x weekly - 1 sets - 10 reps - 5 hold - Supine Piriformis Stretch Pulling Heel to Hip  - 1 x daily - 7 x weekly - 1 sets - 10 reps - 20 hold - Seated Hamstring Stretch with Chair  - 1 x daily - 7 x weekly - 1 sets - 10 reps - 30 hold - Supine Quadriceps Stretch with Strap on Table  - 1 x daily - 7 x weekly - 1 sets - 10 reps - 20 hold - Supine Hip Adductor Stretch  - 1 x daily - 7 x weekly - 1 sets - 10 reps - 20 hold - Supine Hip Flexor Stretch with Weight  - 1 x daily - 7 x weekly - 1 sets - 2 reps - 30 seconds  hold - Seated Quadratus Lumborum Stretch in Chair  - 1 x daily - 7 x weekly - 1 sets - 3 reps - 30 seconds  hold - ITB Stretch at Wall  - 1 x daily - 7 x weekly - 1 sets - 4 reps - 15-30 seconds  hold - Figure 4 Bridge  - 1 x daily - 7 x weekly - 1 sets - 10 reps - 1 seconds  hold - Sidelying Hip Abduction with Resistance at Thighs  - 1 x daily - 7 x weekly - 1 sets - 10 reps - 1 second  hold - Supine March with Posterior Pelvic Tilt  - 1 x daily - 7 x weekly - 3 sets - 10 reps - Dead Bug  - 1 x daily - 7 x weekly - 1 sets - 10 reps  ASSESSMENT:  CLINICAL IMPRESSION:  Pt arrives today feeling better, changed out of the Hokas and feeling a lot better. She is working with a Psychologist, educational and a Land as well as PT, which does complicate care a bit with overlapping services. Her main concern at this point is hip pain, we focused on proximal strength and general flexibility today. Will work to narrow down PT focus for max benefit with multiple services in the picture. Added all new exercises to medbridge per her request. Encouraged her not to do all exercises at one time- break it up into thirds and do a different third each day.    OBJECTIVE IMPAIRMENTS: Abnormal gait, cardiopulmonary status limiting activity, decreased activity tolerance, decreased balance, decreased endurance, decreased mobility, difficulty walking,  decreased ROM, decreased strength, increased edema, increased muscle spasms, impaired flexibility, improper body mechanics, postural dysfunction, and pain.   REHAB POTENTIAL: Good  CLINICAL DECISION MAKING: Stable/uncomplicated  EVALUATION COMPLEXITY: Low   GOALS: Goals reviewed with patient? Yes  SHORT TERM GOALS: Target date: 11/21/23 Independent with initial HEP Goal status: Met 11/08/23  LONG TERM GOALS: Target date: 01/28/24  Independent with advanced HEP Goal status: INITIAL  2.  Reports right knee pain decreased 50% Goal status: INITIAL  3.  Increase SLR to 75 degrees Goal status: INITIAL  4.  Increase strength of the hips to 4/5 Goal status: INITIAL  5.  Decrease TUG time to 12 seconds Goal status: INITIAL  PLAN:  PT FREQUENCY: 1x/week  PT DURATION: 12 weeks  PLANNED INTERVENTIONS: 97164- PT Re-evaluation, 97110-Therapeutic exercises, 97530- Therapeutic activity, [id number]- Neuromuscular re-education, 97535- Self Care, 75643- Manual therapy, [id number]- Gait training, [phone]- Electrical stimulation (manual), [id number]- Vasopneumatic device, [id number]- Ultrasound, [id number]- Ionotophoresis 4mg /ml Dexamethasone, Patient/Family education, Stair training, Taping, Dry Needling, Joint mobilization, Cryotherapy, and Moist heat  PLAN FOR NEXT SESSION: narrow down focus of PT care- she is also working with trainer in the gym as well as chiro,  try not to overlap services for max patient benefit. Proximal focus?   Nedra Hai, PT, DPT 11/22/23 4:17 PM

## 2023-11-30 ENCOUNTER — Ambulatory Visit: Payer: BC Managed Care – PPO | Attending: Internal Medicine | Admitting: Physical Therapy

## 2023-11-30 ENCOUNTER — Encounter: Payer: Self-pay | Admitting: Physical Therapy

## 2023-11-30 DIAGNOSIS — G8929 Other chronic pain: Secondary | ICD-10-CM | POA: Insufficient documentation

## 2023-11-30 DIAGNOSIS — M6281 Muscle weakness (generalized): Secondary | ICD-10-CM | POA: Insufficient documentation

## 2023-11-30 DIAGNOSIS — M25561 Pain in right knee: Secondary | ICD-10-CM | POA: Insufficient documentation

## 2023-11-30 DIAGNOSIS — R262 Difficulty in walking, not elsewhere classified: Secondary | ICD-10-CM | POA: Insufficient documentation

## 2023-11-30 DIAGNOSIS — M5459 Other low back pain: Secondary | ICD-10-CM | POA: Insufficient documentation

## 2023-11-30 NOTE — Therapy (Signed)
OUTPATIENT PHYSICAL THERAPY LOWER EXTREMITY TREATMENT   Patient Name: Paula Bradley MRN: [id number] DOB:1961-11-18, 62 y.o., female Today's Date: 11/30/2023  END OF SESSION:  PT End of Session - 11/30/23 1105     Visit Number 4    Date for PT Re-Evaluation 01/07/24    Authorization Type [employer]    PT Start Time 1100    PT Stop Time 1145    PT Time Calculation (min) 45 min    Activity Tolerance Patient tolerated treatment well    Behavior During Therapy WFL for tasks assessed/performed              Past Medical History:  Diagnosis Date   Allergic rhinitis    Allergy    Anxiety    Anxiety and depression    Arthritis    OA   ASCUS of cervix with negative high risk HPV 09/2018, 10/2019   ECC Pap colposcopy showed LGSIL   Asthma    Cataract    removed both eyes   Depression    GERD (gastroesophageal reflux disease)    OCC- On Omeprazole as needed    Heart murmur    Hyperlipidemia    Kidney stone    Menopause    Past Surgical History:  Procedure Laterality Date   CESAREAN SECTION  1996   COLONOSCOPY  2011   benign colonic mucosa    COLPOSCOPY     DILATION AND CURETTAGE OF UTERUS     TONSILLECTOMY  1985   Patient Active Problem List   Diagnosis Date Noted   Asthma, not well controlled, moderate persistent, with acute exacerbation 01/07/2020   COVID-19 01/07/2020   Bronchitis 08/22/2018   Kidney stone 08/10/2018   GERD (gastroesophageal reflux disease) 01/21/2015   Allergic rhinitis 01/21/2015   Hyperlipidemia 09/22/2014   Obesity 01/04/2012   Asthma 11/29/2011   Anxiety 11/29/2011    PCP: Daisey Must, MD  REFERRING PROVIDER: Daisey Must, MD  REFERRING DIAG: right knee pain  THERAPY DIAG:  Chronic pain of right knee  Muscle weakness (generalized)  Difficulty in walking, not elsewhere classified  Other low back pain  Rationale for Evaluation and Treatment: Rehabilitation  ONSET DATE: 10/19/23  SUBJECTIVE:   SUBJECTIVE STATEMENT:  I think  I am doing well.  Have questions about the HEP and how to stay flexible  PERTINENT HISTORY: See above PAIN:  Are you having pain? Yes: NPRS scale: 0/10 Pain location: right knee , right hip and in the low back Pain description: ache Aggravating factors: worse with [place] shoes, difficulty sleeping, pain up to 10/10 Relieving factors: Brooks shoes, chiropractor all help  PRECAUTIONS: None  RED FLAGS: None   WEIGHT BEARING RESTRICTIONS: No  FALLS:  Has patient fallen in last 6 months? 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  DIAGNOSTIC FINDINGS: x-rays mild OA  COGNITION: Overall cognitive status: Within functional limits for tasks assessed     SENSATION: WFL MUSCLE LENGTH: Hamstrings: Right 60 deg; Left 60 deg Very tight ITB, piriformis, hip flexor and quad  POSTURE: rounded shoulders and forward head  PALPATION: Tender and very tight in the low back, buttocks, SI, right ITB and medial knee  ROM of the Lumbar spine:  limited 50% with tightness in the low back  LOWER EXTREMITY ROM:  WFL's but very tight in the mms  LOWER EXTREMITY MMT:  MMT Right eval Left eval  Hip flexion 3+ 4-  Hip extension 4- 4-  Hip abduction 3+ 4-  Hip adduction    Hip internal rotation    Hip external rotation    Knee flexion 4 4+  Knee extension 4- 4+  Ankle dorsiflexion    Ankle plantarflexion    Ankle inversion    Ankle eversion     (Blank rows = not tested)  LOWER EXTREMITY SPECIAL TESTS:  Knee special tests: Lachman Test:  negative, McMurray's test: negative, and Pivot shift test: positive   FUNCTIONAL TESTS:  5 times sit to stand: 23 seconds Timed up and go (TUG): 16 seconds  GAIT: Distance walked: 50 feet Assistive device utilized: None Level of assistance: Complete Independence Comments: mild antalgic on the right   TODAY'S TREATMENT:                                                                                                                              DATE:  11/30/23 Nustep level 5 x 6 minutes 5# Straight arm pulls 5# hip extension and abduction 40# resisted gait all directions Reviewed HEP and showed her the app on her phone how to pull it up easily Passive stretch LE's, education on the stretches for home Use and education of the T-gun for home use  11/22/23  TherEx  Nustep L5 x6 minutes BLEs only  Hip flexor stretch 2x30 seconds B  Supine figure 4 stretch 2x30 seconds B QL stretch 3 ways with stool 30 seconds each  Alternate version of QL stretch 1x30 seconds b ITB stretch on wall 1x15 seconds B Demonstrated sidelying ITB stretch on the bed Single leg bridge x10 B Sidelying hip ABD red TB x10 B PPT 15x3 second holds  PPT + march x10  PPT + dead bug x10   11/18/23 NuStep L 5 x 6 min  S2S holding yellow ball 2x10  LAQ RLE 5lb 2x10 HS curls blue 2x10 Forward & Lateral step ups 6in x 10 each  Heel raises 2x15 Shoulder Ext 10lb 2x10 Resisted gait forward and backwards  LLE eccentric step downs  11/07/23 = Eval and establish HEP    PATIENT EDUCATION:  Education details: HEP/POC Person educated: Patient Education method: Programmer, multimedia, Facilities manager, Verbal cues, and Handouts Education comprehension: verbalized understanding  HOME EXERCISE PROGRAM:  Access Code: [email or web address] URL: [email or web address] Date: 11/22/2023 Prepared by: Nedra Hai  Exercises - Supine Lower Trunk Rotation  - 1 x daily - 7 x weekly - 1 sets - 10 reps - 5 hold - Hooklying  Single Knee to Chest Stretch  - 1 x daily - 7 x weekly - 1 sets - 10 reps -  5 hold - Supine Pelvic Tilt  - 1 x daily - 7 x weekly - 1 sets - 10 reps - 5 hold - Supine Piriformis Stretch Pulling Heel to Hip  - 1 x daily - 7 x weekly - 1 sets - 10 reps - 20 hold - Seated Hamstring Stretch with Chair  - 1 x daily - 7 x weekly - 1 sets - 10 reps - 30 hold - Supine Quadriceps Stretch with Strap on Table  - 1 x daily - 7 x weekly - 1 sets - 10 reps - 20 hold - Supine Hip Adductor Stretch  - 1 x daily - 7 x weekly - 1 sets - 10 reps - 20 hold - Supine Hip Flexor Stretch with Weight  - 1 x daily - 7 x weekly - 1 sets - 2 reps - 30 seconds  hold - Seated Quadratus Lumborum Stretch in Chair  - 1 x daily - 7 x weekly - 1 sets - 3 reps - 30 seconds  hold - ITB Stretch at Wall  - 1 x daily - 7 x weekly - 1 sets - 4 reps - 15-30 seconds  hold - Figure 4 Bridge  - 1 x daily - 7 x weekly - 1 sets - 10 reps - 1 seconds  hold - Sidelying Hip Abduction with Resistance at Thighs  - 1 x daily - 7 x weekly - 1 sets - 10 reps - 1 second  hold - Supine March with Posterior Pelvic Tilt  - 1 x daily - 7 x weekly - 3 sets - 10 reps - Dead Bug  - 1 x daily - 7 x weekly - 1 sets - 10 reps  ASSESSMENT:  CLINICAL IMPRESSION:  Pt arrives today feeling better, she reports that she feels that she has a plan, has a few questions and needed help on how to find the app on her phone, we did this with her today, reviewed HEP, stretches and use of T-Gun at home.  She remains tight and we had a good talk on finding and making time for herself to stretch.  We talked that how tight she is seems to be one of the bigger causes of her pain  OBJECTIVE IMPAIRMENTS: Abnormal gait, cardiopulmonary status limiting activity, decreased activity tolerance, decreased balance, decreased endurance, decreased mobility, difficulty walking, decreased ROM, decreased strength, increased edema, increased muscle spasms, impaired flexibility, improper body  mechanics, postural dysfunction, and pain.   REHAB POTENTIAL: Good  CLINICAL DECISION MAKING: Stable/uncomplicated  EVALUATION COMPLEXITY: Low   GOALS: Goals reviewed with patient? Yes  SHORT TERM GOALS: Target date: 11/21/23 Independent with initial HEP Goal status: Met 11/08/23  LONG TERM GOALS: Target date: 01/28/24  Independent with advanced HEP Goal status: met 11/30/23  2.  Reports right knee pain decreased 50% Goal status: INITIAL  3.  Increase SLR to 75 degrees Goal status:met 11/30/23  4.  Increase strength of the hips to 4/5 Goal status: met 11/30/23  5.  Decrease TUG time to 12 seconds Goal status: met 11/30/23  PLAN:  PT FREQUENCY: 1x/week  PT DURATION: 12 weeks  PLANNED INTERVENTIONS: 97164- PT Re-evaluation, 97110-Therapeutic exercises, 97530- Therapeutic activity, 97112- Neuromuscular re-education, 97535- Self Care, 16109- Manual therapy, [id number]- Gait training, [phone]- Electrical stimulation (manual), [id number]- Vasopneumatic device, [id number]- Ultrasound, 09811- Ionotophoresis 4mg /ml Dexamethasone, Patient/Family education, Stair training, Taping, Dry Needling, Joint mobilization, Cryotherapy, and Moist heat  PLAN FOR NEXT SESSION: D/C most goals met and she has a good  plan to continue  Stacie Glaze, PT 11/30/23 11:07 AM

## 2023-12-07 ENCOUNTER — Ambulatory Visit: Payer: BC Managed Care – PPO | Admitting: Physical Therapy

## 2023-12-13 ENCOUNTER — Ambulatory Visit: Payer: BC Managed Care – PPO | Admitting: Physical Therapy

## 2024-05-09 ENCOUNTER — Ambulatory Visit (INDEPENDENT_AMBULATORY_CARE_PROVIDER_SITE_OTHER): Admitting: Podiatry

## 2024-05-09 DIAGNOSIS — R200 Anesthesia of skin: Secondary | ICD-10-CM

## 2024-05-09 DIAGNOSIS — R202 Paresthesia of skin: Secondary | ICD-10-CM | POA: Diagnosis not present

## 2024-05-09 DIAGNOSIS — G5732 Lesion of lateral popliteal nerve, left lower limb: Secondary | ICD-10-CM

## 2024-05-09 NOTE — Progress Notes (Signed)
 Subjective:  Patient ID: Paula Bradley, female    DOB: Sep 22, 1961,  MRN: [id number]  Chief Complaint  Patient presents with   Numbness    Left foot numbness on top of foot    63 y.o. female presents with the above complaint.  Patient presents with new complaint of left foot numbness tingling.  Patient states that is going on the outside part of the foot as well as the top of her feet she wanted to get it evaluated just came out of nowhere has been going on for few weeks has progressive gotten worse.  She is not a diabetic wanted to get it evaluated pain scale 7 out of 10.  No hip or back pain noted.   Review of Systems: Negative except as noted in the HPI. Denies N/V/F/Ch.  Past Medical History:  Diagnosis Date   Allergic rhinitis    Allergy    Anxiety    Anxiety and depression    Arthritis    OA   ASCUS of cervix with negative high risk HPV 09/2018, 10/2019   ECC Pap colposcopy showed LGSIL   Asthma    Cataract    removed both eyes   Depression    GERD (gastroesophageal reflux disease)    OCC- On Omeprazole as needed    Heart murmur    Hyperlipidemia    Kidney stone    Menopause     Current Outpatient Medications:    Ascorbic Acid (VITAMIN C) 1000 MG tablet, Take 1,000 mg by mouth daily., Disp: , Rfl:    Cholecalciferol (VITAMIN D ) 2000 UNITS tablet, Take 2,000 Units by mouth daily., Disp: , Rfl:    clonazePAM  (KLONOPIN ) 1 MG tablet, Take 1 tablet (1 mg total) by mouth 2 (two) times daily., Disp: 60 tablet, Rfl: 2   Coenzyme Q10 200 MG capsule, Take 200 mg by mouth daily. , Disp: , Rfl:    GARLIC PO, Take by mouth., Disp: , Rfl:    levalbuterol  (XOPENEX  HFA) 45 MCG/ACT inhaler, Inhale 2 puffs into the lungs., Disp: , Rfl:    meloxicam  (MOBIC ) 15 MG tablet, Take 1 tablet (15 mg total) by mouth daily., Disp: 30 tablet, Rfl: 0   Multiple Vitamin (MULTIVITAMIN) tablet, Take 1 tablet by mouth daily., Disp: , Rfl:    Omega-3 Fatty Acids (FISH OIL) 1000 MG CAPS, Take by  mouth., Disp: , Rfl:    omeprazole (PRILOSEC) 20 MG capsule, Take 20 mg by mouth daily., Disp: , Rfl:    PARoxetine (PAXIL-CR) 25 MG 24 hr tablet, Take 25 mg by mouth daily., Disp: , Rfl:    Probiotic Product (PROBIOTIC PO), Take by mouth. Suprema Dophilus 5 billion 1 daily, Disp: , Rfl:    progesterone  (PROMETRIUM ) 100 MG capsule, Take 1 capsule (100 mg total) by mouth at bedtime. (Patient not taking: Reported on 02/04/2021), Disp: 30 capsule, Rfl: 4   simvastatin  (ZOCOR ) 5 MG tablet, simvastatin  5 mg tablet (Patient not taking: No sig reported), Disp: , Rfl:    Tart Cherry 1200 MG CAPS, Take by mouth., Disp: , Rfl:    TURMERIC PO, Take by mouth. 2 daily, Disp: , Rfl:    Zinc Sulfate (ZINC-220 PO), Take by mouth., Disp: , Rfl:   Social History   Tobacco Use  Smoking Status Never  Smokeless Tobacco Never    Allergies  Allergen Reactions   Duloxetine Other (See Comments)    Fatigue   Ezetimibe Other (See Comments)    Muscle aches; same side  effect as with statins   Statins     Myalgias on simvastatin  20mg  daily, rosuvastatin  5mg  and 20mg  daily, atorvastatin  10mg  daily, and pitavastatin  1mg  daily   Fluticasone-Salmeterol Hives    REACTION: hives REACTION: hives   Scallops [Shellfish Allergy] Hives   Objective:  There were no vitals filed for this visit. There is no height or weight on file to calculate BMI. Constitutional Well developed. Well nourished.  Vascular Dorsalis pedis pulses palpable bilaterally. Posterior tibial pulses palpable bilaterally. Capillary refill normal to all digits.  No cyanosis or clubbing noted. Pedal hair growth normal.  Neurologic Normal speech. Oriented to person, place, and time. Epicritic sensation to light touch grossly present bilaterally.  Dermatologic Nails well groomed and normal in appearance. No open wounds. No skin lesions.  Orthopedic: Pain along the course of the common peroneal nerve.  Positive Tinel sign and Valliex sign noted to  the common peroneal nerve.  Negative tarsal tunnel syndrome noted.   Radiographs: None Assessment:   1. 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 Numbness and tingling    Plan:  Patient was evaluated and treated and all questions answered.  Left common peroneal nerve compression - All questions and concerns were discussed with the patient in extensive detail given the acute onset of symptoms patient will benefit from nerve conduction study nerve conduction study was ordered.  I placed the order [facility].  I encouraged her to give them a phone call to schedule an appointment she states understanding - If there is a common peroneal nerve involvement patient will benefit from decompression by neurosurgery. - Discussed shoe gear modification - Discussed gabapentin and Lyrica.  For now we will wait on nerve conduction study

## 2024-07-13 ENCOUNTER — Ambulatory Visit: Admission: EM | Admit: 2024-07-13 | Discharge: 2024-07-13 | Disposition: A | Discharge status: Home / Self Care

## 2024-07-13 ENCOUNTER — Encounter: Payer: Self-pay | Admitting: Emergency Medicine

## 2024-07-13 DIAGNOSIS — H60501 Unspecified acute noninfective otitis externa, right ear: Secondary | ICD-10-CM

## 2024-07-13 MED ORDER — CIPROFLOXACIN-DEXAMETHASONE 0.3-0.1 % OT SUSP: 4.0000 [drp] | Freq: Two times a day (BID) | OTIC | 0 refills | Status: AC

## 2024-07-13 NOTE — ED Provider Notes (Signed)
 [facility]    CSN: [id number] Arrival date & time: 07/13/24  0807      History   Chief Complaint Chief Complaint  Patient presents with   Otalgia    HPI Paula Bradley is a 63 y.o. female.   Discussed the use of AI scribe software for clinical note transcription with the patient, who gave verbal consent to proceed.   The patient presents with right ear pain and decreased hearing that has been ongoing for about a week. This morning, the symptoms significantly worsened, with the patient reporting she can't hardly hear out of it or touch it.  The patient describes the hearing in the affected ear as muffled and notes that when speaking, it doesn't sound right in her ear. Associated symptoms include some dizziness and headache. The patient denies any ringing in the ear or noticeable discharge or drainage. The left ear feels fine and is not affected. The patient reports recent exposure to water , which may be relevant to the onset of symptoms. She denies any cough, cold, allergy symptoms, significant runny nose, or fevers. The patient has not attempted any treatments for her symptoms prior to this visit.  The following portions of the patient's history were reviewed and updated as appropriate: allergies, current medications, past family history, past medical history, past social history, past surgical history, and problem list.      Past Medical History:  Diagnosis Date   Allergic rhinitis    Allergy    Anxiety    Anxiety and depression    Arthritis    OA   ASCUS of cervix with negative high risk HPV 09/2018, 10/2019   ECC Pap colposcopy showed LGSIL   Asthma    Cataract    removed both eyes   Depression    GERD (gastroesophageal reflux disease)    OCC- On Omeprazole as needed    Heart murmur    Hyperlipidemia    Kidney stone    Menopause     Patient Active Problem List   Diagnosis Date Noted   Asthma, not well controlled, moderate persistent, with  acute exacerbation 01/07/2020   COVID-19 01/07/2020   Bronchitis 08/22/2018   Kidney stone 08/10/2018   GERD (gastroesophageal reflux disease) 01/21/2015   Allergic rhinitis 01/21/2015   Hyperlipidemia 09/22/2014   Obesity 01/04/2012   Asthma 11/29/2011   Anxiety 11/29/2011    Past Surgical History:  Procedure Laterality Date   CESAREAN SECTION  1996   COLONOSCOPY  2011   benign colonic mucosa    COLPOSCOPY     DILATION AND CURETTAGE OF UTERUS     TONSILLECTOMY  1985    OB History     Gravida  4   Para  2   Term  2   Preterm      AB  2   Living  2      SAB      IAB      Ectopic      Multiple      Live Births               Home Medications    Prior to Admission medications   Medication Sig Start Date End Date Taking? Authorizing Provider  ciprofloxacin-dexamethasone (CIPRODEX) OTIC suspension Place 4 drops into the right ear 2 (two) times daily for 7 days. 07/13/24 07/20/24 Yes Iola Lukes, FNP  DULoxetine (CYMBALTA) 60 MG capsule Take 60 mg by mouth daily. 07/12/24  Yes [provider]  Ascorbic Acid (VITAMIN C) 1000 MG tablet Take 1,000 mg by mouth daily.    [provider]  Cholecalciferol (VITAMIN D ) 2000 UNITS tablet Take 2,000 Units by mouth daily.    [provider]  clonazePAM  (KLONOPIN ) 1 MG tablet Take 1 tablet (1 mg total) by mouth 2 (two) times daily. 06/01/19   Perri Ronal PARAS, MD  Coenzyme Q10 200 MG capsule Take 200 mg by mouth daily.     [provider]  GARLIC PO Take by mouth.    [provider]  levalbuterol  (XOPENEX  HFA) 45 MCG/ACT inhaler Inhale 2 puffs into the lungs. 12/18/20   [provider]  meloxicam  (MOBIC ) 15 MG tablet Take 1 tablet (15 mg total) by mouth daily. 12/30/21   Tobie Franky SQUIBB, DPM  Multiple Vitamin (MULTIVITAMIN) tablet Take 1 tablet by mouth daily.    [provider]  Omega-3 Fatty Acids (FISH OIL) 1000 MG CAPS Take by mouth.    [provider]  omeprazole (PRILOSEC) 20 MG capsule Take 20 mg by mouth daily. 12/04/20   [provider]  PARoxetine (PAXIL-CR) 25 MG 24 hr tablet Take 25 mg by mouth daily.    [provider]  Probiotic Product (PROBIOTIC PO) Take by mouth. Suprema Dophilus 5 billion 1 daily    [provider]  progesterone  (PROMETRIUM ) 100 MG capsule Take 1 capsule (100 mg total) by mouth at bedtime. Patient not taking: Reported on 02/04/2021 06/27/20   Arnaldo Purchase, MD  simvastatin  (ZOCOR ) 5 MG tablet simvastatin  5 mg tablet Patient not taking: No sig reported 10/07/20   [provider]  Tart Cherry 1200 MG CAPS Take by mouth.    [provider]  TURMERIC PO Take by mouth. 2 daily    [provider]  Zinc Sulfate (ZINC-220 PO) Take by mouth.    [provider]    Family History Family History  Problem Relation Age of Onset   Breast cancer Cousin        Maternal 1st cousin--Age 82's   Hypertension Mother    Diabetes Mother    Hyperlipidemia Mother    Heart disease Mother    Colon polyps Mother    Hypertension Father    Hyperlipidemia Father    Heart disease Father    Colon polyps Father    Heart disease Brother    Hyperlipidemia Brother    Colon cancer Neg Hx    Esophageal cancer Neg Hx    Pancreatic cancer Neg Hx    Stomach cancer Neg Hx    Liver disease Neg Hx    Rectal cancer Neg Hx     Social History Social History   Tobacco Use   Smoking status: Never   Smokeless tobacco: Never  Vaping Use   Vaping status: Never Used  Substance Use Topics   Alcohol use: Yes    Alcohol/week: 4.0 standard drinks of alcohol    Types: 4 Standard drinks or equivalent per week    Comment: wine 3-4 glasses per week   Drug use: No     Allergies   Ezetimibe, Statins, Fluticasone-salmeterol, and Scallops [shellfish allergy]   Review of Systems Review of Systems  HENT:  Positive for ear pain (right) and hearing loss (muffled).  Negative for ear discharge and tinnitus.   Neurological:  Positive for dizziness and headaches.  All other systems reviewed and are negative.    Physical Exam Triage Vital Signs ED Triage Vitals  Encounter Vitals Group  BP 07/13/24 0819 138/83     Girls Systolic BP Percentile --      Girls Diastolic BP Percentile --      Boys Systolic BP Percentile --      Boys Diastolic BP Percentile --      Pulse Rate 07/13/24 0819 65     Resp 07/13/24 0819 17     Temp 07/13/24 0819 97.6 F (36.4 C)     Temp Source 07/13/24 0819 Oral     SpO2 07/13/24 0819 96 %     Weight --      Height --      Head Circumference --      Peak Flow --      Pain Score 07/13/24 0822 10     Pain Loc --      Pain Education --      Exclude from Growth Chart --    No data found.  Updated Vital Signs BP 138/83 (BP Location: Right Arm)   Pulse 65   Temp 97.6 F (36.4 C) (Oral)   Resp 17   SpO2 96%   Visual Acuity Right Eye Distance:   Left Eye Distance:   Bilateral Distance:    Right Eye Near:   Left Eye Near:    Bilateral Near:     Physical Exam Vitals reviewed.  Constitutional:      General: She is awake. She is not in acute distress.    Appearance: Normal appearance. She is well-developed. She is not ill-appearing, toxic-appearing or diaphoretic.  HENT:     Head: Normocephalic.     Right Ear: Hearing, tympanic membrane and external ear normal. Swelling and tenderness present. No drainage. No mastoid tenderness.     Left Ear: Hearing, tympanic membrane, ear canal and external ear normal.     Nose: Nose normal.     Mouth/Throat:     Mouth: Mucous membranes are moist.  Eyes:     General: Vision grossly intact.     Conjunctiva/sclera: Conjunctivae normal.  Cardiovascular:     Rate and Rhythm: Normal rate and regular rhythm.     Heart sounds: Normal heart sounds.  Pulmonary:     Effort: Pulmonary effort is normal.     Breath sounds: Normal breath sounds and air entry.  Musculoskeletal:         General: Normal range of motion.     Cervical back: Full passive range of motion without pain, normal range of motion and neck supple.  Skin:    General: Skin is warm and dry.  Neurological:     General: No focal deficit present.     Mental Status: She is alert and oriented to person, place, and time.  Psychiatric:        Speech: Speech normal.        Behavior: Behavior is cooperative.      UC Treatments / Results  Labs (all labs ordered are listed, but only abnormal results are displayed) Labs Reviewed - No data to display  EKG   Radiology No results found.  Procedures Procedures (including critical care time)  Medications Ordered in UC Medications - No data to display  Initial Impression / Assessment and Plan / UC Course  I have reviewed the triage vital signs and the nursing notes.  Pertinent labs & imaging results that were available during my care of the patient were reviewed by me and considered in my medical decision making (see chart for details).     Patient  presents with one week of right ear pain and decreased hearing, with acute worsening this morning. Symptoms are accompanied by dizziness and headache. Patient reports recent water  exposure. Physical exam reveals a red, swollen, and irritated right ear canal with a normal tympanic membrane, consistent with otitis externa. The left ear is unremarkable. Ciprodex ear drops were prescribed for one week. Patient was instructed on proper ear drop administration and advised to avoid further water  exposure to the affected ear. Pain management includes Tylenol  or Motrin and warm compresses. Patient should follow up with their primary care provider if symptoms do not improve or worsen after completing the course of treatment. ED evaluation is indicated for severe or spreading infection, persistent dizziness, or worsening hearing loss.  Today's evaluation has revealed no signs of a dangerous process. Discussed  diagnosis with patient and/or guardian. Patient and/or guardian aware of their diagnosis, possible red flag symptoms to watch out for and need for close follow up. Patient and/or guardian understands verbal and written discharge instructions. Patient and/or guardian comfortable with plan and disposition.  Patient and/or guardian has a clear mental status at this time, good insight into illness (after discussion and teaching) and has clear judgment to make decisions regarding their care  Documentation was completed with the aid of voice recognition software. Transcription may contain typographical errors. Final Clinical Impressions(s) / UC Diagnoses   Final diagnoses:  Acute otitis externa of right ear, unspecified type     Discharge Instructions      You were diagnosed with an outer ear infection (otitis externa), likely caused by recent water  exposure. To treat this, use Ciprodex ear drops in your right ear twice a day for 7 days. Tilt your head to the left when applying the drops and keep your head tilted for a few minutes afterward. Do not place cotton balls or anything else inside your ear after using the drops. Keep the affected ear dry while healing. When showering, you can place a cotton ball coated with small amount of Vaseline in the outer part of your ear to prevent water  from getting in. Avoid swimming or submerging your head underwater until the infection has fully resolved. For pain, you may take over-the-counter medications such as Tylenol  or Motrin as needed. Applying a warm compress or heating pad to the side of your face may also help ease discomfort. Contact your primary care provider if your symptoms do not improve after completing the full course of ear drops or if the pain, dizziness, or hearing loss worsens. Seek emergency care if you develop severe facial swelling, high fever, drainage from the ear that increases, persistent vomiting, or if dizziness becomes intense or  unsteady.      ED Prescriptions     Medication Sig Dispense Auth. Provider   ciprofloxacin-dexamethasone (CIPRODEX) OTIC suspension Place 4 drops into the right ear 2 (two) times daily for 7 days. 2.8 mL Iola Lukes, FNP      PDMP not reviewed this encounter.   Iola Sterling, [place] 07/13/24 [phone]

## 2024-07-13 NOTE — Discharge Instructions (Addendum)
 You were diagnosed with an outer ear infection (otitis externa), likely caused by recent water  exposure. To treat this, use Ciprodex ear drops in your right ear twice a day for 7 days. Tilt your head to the left when applying the drops and keep your head tilted for a few minutes afterward. Do not place cotton balls or anything else inside your ear after using the drops. Keep the affected ear dry while healing. When showering, you can place a cotton ball coated with small amount of Vaseline in the outer part of your ear to prevent water  from getting in. Avoid swimming or submerging your head underwater until the infection has fully resolved. For pain, you may take over-the-counter medications such as Tylenol  or Motrin as needed. Applying a warm compress or heating pad to the side of your face may also help ease discomfort. Contact your primary care provider if your symptoms do not improve after completing the full course of ear drops or if the pain, dizziness, or hearing loss worsens. Seek emergency care if you develop severe facial swelling, high fever, drainage from the ear that increases, persistent vomiting, or if dizziness becomes intense or unsteady.

## 2024-07-13 NOTE — ED Triage Notes (Signed)
Pt c/o right ear pain for a few days.  

## 2024-11-17 ENCOUNTER — Ambulatory Visit
Admission: EM | Admit: 2024-11-17 | Discharge: 2024-11-17 | Disposition: A | Discharge status: Home / Self Care | Attending: Emergency Medicine | Admitting: Emergency Medicine

## 2024-11-17 ENCOUNTER — Encounter: Payer: Self-pay | Admitting: Emergency Medicine

## 2024-11-17 DIAGNOSIS — J069 Acute upper respiratory infection, unspecified: Secondary | ICD-10-CM | POA: Diagnosis present

## 2024-11-17 DIAGNOSIS — J4521 Mild intermittent asthma with (acute) exacerbation: Secondary | ICD-10-CM | POA: Diagnosis present

## 2024-11-17 LAB — POCT RAPID STREP A (OFFICE): Rapid Strep A Screen: NEGATIVE

## 2024-11-17 MED ORDER — FLUTICASONE PROPIONATE 50 MCG/ACT NA SUSP: 2.0000 | Freq: Every day | NASAL | 2 refills | Status: AC

## 2024-11-17 MED ORDER — PROMETHAZINE-DM 6.25-15 MG/5ML PO SYRP: 5.0000 mL | ORAL_SOLUTION | Freq: Every evening | ORAL | 0 refills | Status: AC | PRN

## 2024-11-17 MED ORDER — PREDNISONE 20 MG PO TABS: 40.0000 mg | ORAL_TABLET | Freq: Every day | ORAL | 0 refills | Status: AC

## 2024-11-17 NOTE — ED Provider Notes (Signed)
 [facility]    CSN: [id number] Arrival date & time: 11/17/24  9096      History   Chief Complaint Chief Complaint  Patient presents with   Sore Throat   Otalgia    HPI Paula Bradley is a 63 y.o. female.  Here with 3 day history of sore throat and bilateral ear pressure Throat pain is rated 5/10 Reporting cough and mild chest tightness. Not short of breath or wheezing. Not having fever or chills. No abd pain, NVD, rash  Possible sick contacts, recent family gathering, was at the beach this past weekend  Asthma history, mild intermittent followed by pulmonology  Has tried her Qvar  BID and use albuterol  inhaler this morning   Past Medical History:  Diagnosis Date   Allergic rhinitis    Allergy    Anxiety    Anxiety and depression    Arthritis    OA   ASCUS of cervix with negative high risk HPV 09/2018, 10/2019   ECC Pap colposcopy showed LGSIL   Asthma    Cataract    removed both eyes   Depression    GERD (gastroesophageal reflux disease)    OCC- On Omeprazole as needed    Heart murmur    Hyperlipidemia    Kidney stone    Menopause     Patient Active Problem List   Diagnosis Date Noted   Asthma, not well controlled, moderate persistent, with acute exacerbation 01/07/2020   COVID-19 01/07/2020   Bronchitis 08/22/2018   Kidney stone 08/10/2018   GERD (gastroesophageal reflux disease) 01/21/2015   Allergic rhinitis 01/21/2015   Hyperlipidemia 09/22/2014   Obesity 01/04/2012   Asthma 11/29/2011   Anxiety 11/29/2011    Past Surgical History:  Procedure Laterality Date   CESAREAN SECTION  1996   COLONOSCOPY  2011   benign colonic mucosa    COLPOSCOPY     DILATION AND CURETTAGE OF UTERUS     TONSILLECTOMY  1985    OB History     Gravida  4   Para  2   Term  2   Preterm      AB  2   Living  2      SAB      IAB      Ectopic      Multiple      Live Births               Home Medications    Prior to  Admission medications   Medication Sig Start Date End Date Taking? Authorizing Provider  fluticasone  (FLONASE ) 50 MCG/ACT nasal spray Place 2 sprays into both nostrils daily. 11/17/24  Yes Evelyn Moch, [place], PA-C  predniSONE  (DELTASONE ) 20 MG tablet Take 2 tablets (40 mg total) by mouth daily with breakfast for 5 days. 11/17/24 11/22/24 Yes Bridgitt Raggio, [place], PA-C  promethazine -dextromethorphan (PROMETHAZINE -DM) 6.25-15 MG/5ML syrup Take 5 mLs by mouth at bedtime as needed for cough. 11/17/24  Yes Gor Vestal, [place], PA-C  Ascorbic Acid (VITAMIN C) 1000 MG tablet Take 1,000 mg by mouth daily.    [provider]  Cholecalciferol (VITAMIN D ) 2000 UNITS tablet Take 2,000 Units by mouth daily.    [provider]  clonazePAM  (KLONOPIN ) 1 MG tablet Take 1 tablet (1 mg total) by mouth 2 (two) times daily. 06/01/19   Perri Ronal PARAS, MD  Coenzyme Q10 200 MG capsule Take 200 mg by mouth daily.     [provider]  DULoxetine (CYMBALTA) 60 MG capsule  Take 60 mg by mouth daily. 07/12/24   [provider]  GARLIC PO Take by mouth.    [provider]  levalbuterol  (XOPENEX  HFA) 45 MCG/ACT inhaler Inhale 2 puffs into the lungs. 12/18/20   [provider]  meloxicam  (MOBIC ) 15 MG tablet Take 1 tablet (15 mg total) by mouth daily. 12/30/21   Tobie Franky SQUIBB, DPM  Multiple Vitamin (MULTIVITAMIN) tablet Take 1 tablet by mouth daily.    [provider]  Omega-3 Fatty Acids (FISH OIL) 1000 MG CAPS Take by mouth.    [provider]  omeprazole (PRILOSEC) 20 MG capsule Take 20 mg by mouth daily. 12/04/20   [provider]  PARoxetine (PAXIL-CR) 25 MG 24 hr tablet Take 25 mg by mouth daily.    [provider]  Probiotic Product (PROBIOTIC PO) Take by mouth. Suprema Dophilus 5 billion 1 daily    [provider]  Tart Cherry 1200 MG CAPS Take by mouth.    [provider]  TURMERIC PO Take by mouth. 2 daily    [provider]  Zinc Sulfate (ZINC-220 PO) Take by mouth.    [provider]    Family History Family History  Problem Relation Age of Onset   Breast cancer Cousin        Maternal 1st cousin--Age 39's   Hypertension Mother    Diabetes Mother    Hyperlipidemia Mother    Heart disease Mother    Colon polyps Mother    Hypertension Father    Hyperlipidemia Father    Heart disease Father    Colon polyps Father    Heart disease Brother    Hyperlipidemia Brother    Colon cancer Neg Hx    Esophageal cancer Neg Hx    Pancreatic cancer Neg Hx    Stomach cancer Neg Hx    Liver disease Neg Hx    Rectal cancer Neg Hx     Social History Social History   Tobacco Use   Smoking status: Never   Smokeless tobacco: Never  Vaping Use   Vaping status: Never Used  Substance Use Topics   Alcohol use: Yes    Alcohol/week: 4.0 standard drinks of alcohol    Types: 4 Standard drinks or equivalent per week    Comment: wine 3-4 glasses per week   Drug use: No     Allergies   Ezetimibe, Statins, Fluticasone -salmeterol, and Scallops [shellfish allergy]   Review of Systems Review of Systems As per HPI  Physical Exam Triage Vital Signs ED Triage Vitals  Encounter Vitals Group     BP 11/17/24 0908 (!) 178/97     Girls Systolic BP Percentile --      Girls Diastolic BP Percentile --      Boys Systolic BP Percentile --      Boys Diastolic BP Percentile --      Pulse Rate 11/17/24 0908 74     Resp 11/17/24 0908 16     Temp 11/17/24 0908 98.4 F (36.9 C)     Temp Source 11/17/24 0908 Oral     SpO2 11/17/24 0908 95 %     Weight 11/17/24 0908 195 lb (88.5 kg)     Height 11/17/24 0908 5' 5 (1.651 m)     Head Circumference --      Peak Flow --      Pain Score 11/17/24 0915 5     Pain Loc --      Pain Education --  Exclude from Growth Chart --    No data found.  Updated Vital Signs BP (!) 156/92 (BP Location: Right Arm)   Pulse 74   Temp 98.4 F (36.9 C) (Oral)   Resp 16    Ht 5' 5 (1.651 m)   Wt 195 lb (88.5 kg)   SpO2 95%   BMI 32.45 kg/m    Physical Exam Vitals and nursing note reviewed.  Constitutional:      General: She is not in acute distress.    Appearance: She is not ill-appearing or diaphoretic.  HENT:     Right Ear: Tympanic membrane and ear canal normal.     Left Ear: Tympanic membrane and ear canal normal.     Nose: No rhinorrhea.     Mouth/Throat:     Mouth: Mucous membranes are moist.     Pharynx: Oropharynx is clear. Posterior oropharyngeal erythema (mild) present. No pharyngeal swelling or oropharyngeal exudate.     Comments: No tonsils  Eyes:     Conjunctiva/sclera: Conjunctivae normal.  Cardiovascular:     Rate and Rhythm: Normal rate and regular rhythm.     Pulses: Normal pulses.     Heart sounds: Normal heart sounds.  Pulmonary:     Effort: Pulmonary effort is normal. No tachypnea or respiratory distress.     Breath sounds: Examination of the right-lower field reveals wheezing. Examination of the left-lower field reveals wheezing. Wheezing (faint, expirational) present. No rhonchi or rales.  Abdominal:     Palpations: Abdomen is soft.     Tenderness: There is no abdominal tenderness. There is no guarding.  Musculoskeletal:     Cervical back: Normal range of motion. No rigidity.  Lymphadenopathy:     Cervical: No cervical adenopathy.  Skin:    General: Skin is warm and dry.  Neurological:     Mental Status: She is alert and oriented to person, place, and time.      UC Treatments / Results  Labs (all labs ordered are listed, but only abnormal results are displayed) Labs Reviewed  CULTURE, GROUP A STREP [facility])  POCT RAPID STREP A (OFFICE)    EKG  Radiology No results found.  Procedures Procedures  Medications Ordered in UC Medications - No data to display  Initial Impression / Assessment and Plan / UC Course  I have reviewed the triage vital signs and the nursing notes.  Pertinent labs & imaging  results that were available during my care of the patient were reviewed by me and considered in my medical decision making (see chart for details).  Afebrile in clinic. Elevated BP improved on recheck. She is on Cymbalta which has raised pressures. Sating 97% room air. Rapid strep negative. Will culture. Discussed supportive care for viral illness. OTC options and prescriptions discussed.  Mild wheezing in the lower lobes on exam. Treat for asthma exacerbation likely caused by viral illness. Prednisone  burst, albuterol  inhaler TID.  History of hives with Advair inhaler but tolerates her QVAR . In the past she has tolerated mometasone nasal spray and oral prednisone  without issue.  Return and ED precautions are verbalized. Advised likely prognosis and symptoms to monitor for. Patient is agreeable to plan, no questions at this time   Final Clinical Impressions(s) / UC Diagnoses   Final diagnoses:  Viral URI with cough  Mild intermittent asthma with acute exacerbation     Discharge Instructions      Please use your albuterol  inhaler 3 times daily (every 6 hours) for the next  several days. Continue your QVAR  twice daily.   Prednisone  2 tablets daily (40 mg total) for the next 5 days  The promethazine  DM cough syrup can be used up to 4 times daily. If this medication makes you drowsy, take only once before bed.  Continue other supportive care for likely virus. Tylenol , ibuprofen, salt water  gargles, lozenges, increase fluids intake.  You can also try nasal spray such as flonase  to reduce ear pressure.  Please allow 4-5 days for symptom improvement. Return if needed. Please go to the emergency department if symptoms worsen.     ED Prescriptions     Medication Sig Dispense Auth. Provider   predniSONE  (DELTASONE ) 20 MG tablet Take 2 tablets (40 mg total) by mouth daily with breakfast for 5 days. 10 tablet Aaran Enberg, PA-C   promethazine -dextromethorphan (PROMETHAZINE -DM) 6.25-15  MG/5ML syrup Take 5 mLs by mouth at bedtime as needed for cough. 180 mL Gearlene Godsil, PA-C   fluticasone  (FLONASE ) 50 MCG/ACT nasal spray Place 2 sprays into both nostrils daily. 9.9 mL Dandrea Medders, [place], PA-C      PDMP not reviewed this encounter.   Mauricia Mertens, [place], PA-C 11/17/24 1009

## 2024-11-17 NOTE — ED Triage Notes (Signed)
 Pt c/o cough, sore throat, bilateral ear pain since Wednesday.

## 2024-11-17 NOTE — Discharge Instructions (Addendum)
 Please use your albuterol  inhaler 3 times daily (every 6 hours) for the next several days. Continue your QVAR  twice daily.   Prednisone  2 tablets daily (40 mg total) for the next 5 days  The promethazine  DM cough syrup can be used up to 4 times daily. If this medication makes you drowsy, take only once before bed.  Continue other supportive care for likely virus. Tylenol , ibuprofen, salt water  gargles, lozenges, increase fluids intake.  You can also try nasal spray such as flonase  to reduce ear pressure.  Please allow 4-5 days for symptom improvement. Return if needed. Please go to the emergency department if symptoms worsen.

## 2024-11-21 ENCOUNTER — Ambulatory Visit (HOSPITAL_BASED_OUTPATIENT_CLINIC_OR_DEPARTMENT_OTHER): Payer: Self-pay

## 2024-11-21 LAB — CULTURE, GROUP A STREP (THRC)

## 2024-12-16 ENCOUNTER — Ambulatory Visit
Admission: EM | Admit: 2024-12-16 | Discharge: 2024-12-16 | Disposition: A | Discharge status: Home / Self Care | Attending: Family Medicine | Admitting: Family Medicine

## 2024-12-16 DIAGNOSIS — H01119 Allergic dermatitis of unspecified eye, unspecified eyelid: Secondary | ICD-10-CM

## 2024-12-16 DIAGNOSIS — H1013 Acute atopic conjunctivitis, bilateral: Secondary | ICD-10-CM

## 2024-12-16 MED ORDER — PREDNISONE 20 MG PO TABS: ORAL_TABLET | ORAL | 0 refills | Status: AC

## 2024-12-16 MED ORDER — HYDROXYZINE HCL 25 MG PO TABS: 12.5000 mg | ORAL_TABLET | Freq: Three times a day (TID) | ORAL | 0 refills | Status: AC | PRN

## 2024-12-16 MED ORDER — ERYTHROMYCIN 5 MG/GM OP OINT: TOPICAL_OINTMENT | Freq: Four times a day (QID) | OPHTHALMIC | 0 refills | Status: AC

## 2024-12-16 NOTE — ED Provider Notes (Signed)
 " [facility] [employer]  Note:  This document was prepared using Conservation officer, historic buildings and may include unintentional dictation errors.  MRN: [id number] DOB: 03/02/1961  Subjective:   Paula Bradley is a 63 y.o. female presenting for 2-week history of persistent itchiness and rash over the eyelids.  Left eye is worse including redness, drainage and crusting.  Symptoms started after she used false eyelashes.  Has been using over-the-counter eyedrops without relief.  She does have a history of eczema.  Current Outpatient Medications  Medication Instructions   clonazePAM  (KLONOPIN ) 1 mg, Oral, 2 times daily   Coenzyme Q10 200 mg, Oral, Daily   DULoxetine (CYMBALTA) 60 mg, Daily   fluticasone  (FLONASE ) 50 MCG/ACT nasal spray 2 sprays, Each Nare, Daily   GARLIC PO Oral   levalbuterol  (XOPENEX  HFA) 45 MCG/ACT inhaler 2 puffs   LYLLANA  0.025 MG/24HR 1 patch, Transdermal, 2 times weekly   meloxicam  (MOBIC ) 15 mg, Oral, Daily   Multiple Vitamin (MULTIVITAMIN) tablet 1 tablet, Daily   NEXLETOL 180 MG TABS 1 tablet, Oral, Daily   Omega-3 Fatty Acids (FISH OIL) 1000 MG CAPS Oral   omeprazole (PRILOSEC) 20 mg, Oral, Daily   PARoxetine (PAXIL-CR) 25 mg, Oral, Daily   Probiotic Product (PROBIOTIC PO) Oral, Suprema Dophilus 5 billion 1 daily   progesterone  (PROMETRIUM ) 200 mg, Oral, Daily   promethazine -dextromethorphan (PROMETHAZINE -DM) 6.25-15 MG/5ML syrup 5 mLs, Oral, At bedtime PRN   QVAR  REDIHALER 40 MCG/ACT inhaler 1 puff, Inhalation, 2 times daily   Tart Cherry 1200 MG CAPS Oral   Testosterone 40.5 MG/2.5GM (1.62%) GEL Place onto the skin. 1 application 3 times weekly   TURMERIC PO Oral, 2 daily   vitamin C 1,000 mg, Daily   Vitamin D  2,000 Units, Daily   Zinc Sulfate (ZINC-220 PO) Oral    Allergies[1]  Past Medical History:  Diagnosis Date   Allergic rhinitis    Allergy    Anxiety    Anxiety and depression    Arthritis    OA   ASCUS of cervix with  negative high risk HPV 09/2018, 10/2019   ECC Pap colposcopy showed LGSIL   Asthma    Cataract    removed both eyes   Depression    GERD (gastroesophageal reflux disease)    OCC- On Omeprazole as needed    Heart murmur    Hyperlipidemia    Kidney stone    Menopause      Past Surgical History:  Procedure Laterality Date   CESAREAN SECTION  1996   COLONOSCOPY  2011   benign colonic mucosa    COLPOSCOPY     DILATION AND CURETTAGE OF UTERUS     TONSILLECTOMY  1985    Family History  Problem Relation Age of Onset   Breast cancer Cousin        Maternal 1st cousin--Age 65's   Hypertension Mother    Diabetes Mother    Hyperlipidemia Mother    Heart disease Mother    Colon polyps Mother    Hypertension Father    Hyperlipidemia Father    Heart disease Father    Colon polyps Father    Heart disease Brother    Hyperlipidemia Brother    Colon cancer Neg Hx    Esophageal cancer Neg Hx    Pancreatic cancer Neg Hx    Stomach cancer Neg Hx    Liver disease Neg Hx    Rectal cancer Neg Hx     Social History  Occupational History   Not on file  Tobacco Use   Smoking status: Never   Smokeless tobacco: Never  Vaping Use   Vaping status: Never Used  Substance and Sexual Activity   Alcohol use: Yes    Alcohol/week: 4.0 standard drinks of alcohol    Types: 4 Standard drinks or equivalent per week    Comment: wine 3-4 glasses per week   Drug use: No   Sexual activity: Yes    Birth control/protection: Post-menopausal    Comment: 1st intercourse 31 yo-5 partners     ROS   Objective:   Vitals: BP (!) 162/83 (BP Location: Left Arm)   Pulse 84   Temp 98.8 F (37.1 C) (Oral)   Resp 18   SpO2 95%   Physical Exam Constitutional:      General: She is not in acute distress.    Appearance: Normal appearance. She is well-developed. She is not ill-appearing, toxic-appearing or diaphoretic.  HENT:     Head: Normocephalic and atraumatic.     Nose: Nose normal.      Mouth/Throat:     Mouth: Mucous membranes are moist.  Eyes:     General: Lids are everted, no foreign bodies appreciated. Vision grossly intact. No scleral icterus.       Right eye: No foreign body, discharge or hordeolum.        Left eye: No foreign body, discharge or hordeolum.     Extraocular Movements: Extraocular movements intact.     Right eye: Normal extraocular motion.     Left eye: Normal extraocular motion and no nystagmus.     Conjunctiva/sclera:     Right eye: Right conjunctiva is not injected. No chemosis, exudate or hemorrhage.    Left eye: Left conjunctiva is injected. No chemosis, exudate or hemorrhage.    Comments: Erythematic plaque-like patches over upper and lower eyelids bilaterally.  Left eye with matting of the eyelashes, slight drainage.  Cardiovascular:     Rate and Rhythm: Normal rate.  Pulmonary:     Effort: Pulmonary effort is normal.  Skin:    General: Skin is warm and dry.  Neurological:     General: No focal deficit present.     Mental Status: She is alert and oriented to person, place, and time.  Psychiatric:        Mood and Affect: Mood normal.        Behavior: Behavior normal.     Assessment and Plan :   PDMP not reviewed this encounter.  1. Eyelid dermatitis, allergic/contact   2. Allergic conjunctivitis of both eyes      Suspect eyelid dermatitis secondary to false eyelashes.  Recommended prednisone  given the extent of her symptoms.  Will use erythromycin  ointment as well for the left eye.  Counseled patient on potential for adverse effects with medications prescribed/recommended today, ER and return-to-clinic precautions discussed, patient verbalized understanding.     [1]  Allergies Allergen Reactions   Ezetimibe Other (See Comments)    Muscle aches; same side effect as with statins   Statins     Myalgias on simvastatin  20mg  daily, rosuvastatin  5mg  and 20mg  daily, atorvastatin  10mg  daily, and pitavastatin  1mg  daily   Scallops  [facility]] Hives     Christopher Savannah, [place] 12/16/24 1552  "

## 2024-12-16 NOTE — ED Triage Notes (Addendum)
 Pt c/o of (L) eye itchiness and pain after getting false lashes 2 weeks ago. Patient reports that she has been draining pus from (L) eye. Pt does report blurry vision. Patient has been trying OTC eye drops with no relief.

## 2024-12-17 ENCOUNTER — Ambulatory Visit
